# Patient Record
Sex: Male | Born: 1965 | Race: White | Hispanic: No | Marital: Single | Smoking: Former smoker
Health system: Southern US, Community
[De-identification: ages and names within clinical notes are randomized; demographics above are authoritative.]

## PROBLEM LIST (undated history)

## (undated) DIAGNOSIS — S42301A Unspecified fracture of shaft of humerus, right arm, initial encounter for closed fracture: Secondary | ICD-10-CM

## (undated) DIAGNOSIS — F419 Anxiety disorder, unspecified: Secondary | ICD-10-CM

## (undated) DIAGNOSIS — I639 Cerebral infarction, unspecified: Secondary | ICD-10-CM

## (undated) DIAGNOSIS — S42201A Unspecified fracture of upper end of right humerus, initial encounter for closed fracture: Secondary | ICD-10-CM

## (undated) DIAGNOSIS — S42309A Unspecified fracture of shaft of humerus, unspecified arm, initial encounter for closed fracture: Secondary | ICD-10-CM

## (undated) DIAGNOSIS — E785 Hyperlipidemia, unspecified: Secondary | ICD-10-CM

## (undated) DIAGNOSIS — R55 Syncope and collapse: Secondary | ICD-10-CM

## (undated) DIAGNOSIS — I10 Essential (primary) hypertension: Secondary | ICD-10-CM

## (undated) DIAGNOSIS — J300 Vasomotor rhinitis: Secondary | ICD-10-CM

## (undated) DIAGNOSIS — E119 Type 2 diabetes mellitus without complications: Secondary | ICD-10-CM

## (undated) DIAGNOSIS — G629 Polyneuropathy, unspecified: Secondary | ICD-10-CM

## (undated) DIAGNOSIS — I6521 Occlusion and stenosis of right carotid artery: Secondary | ICD-10-CM

## (undated) DIAGNOSIS — I480 Paroxysmal atrial fibrillation: Secondary | ICD-10-CM

## (undated) HISTORY — DX: Essential (primary) hypertension: I10

## (undated) HISTORY — PX: CATARACT EXTRACTION: SUR2

## (undated) HISTORY — PX: CHOLECYSTECTOMY: SHX55

## (undated) HISTORY — DX: Anxiety disorder, unspecified: F41.9

## (undated) HISTORY — DX: Polyneuropathy, unspecified: G62.9

## (undated) HISTORY — PX: OTHER SURGICAL HISTORY: SHX169

## (undated) HISTORY — PX: CYST REMOVAL NECK: SHX6281

## (undated) HISTORY — PX: WISDOM TOOTH EXTRACTION: SHX21

---

## 2016-08-20 ENCOUNTER — Emergency Department: Payer: Self-pay

## 2016-08-20 ENCOUNTER — Encounter: Payer: Self-pay | Admitting: Emergency Medicine

## 2016-08-20 ENCOUNTER — Emergency Department
Admission: EM | Admit: 2016-08-20 | Discharge: 2016-08-20 | Disposition: A | Discharge status: Home / Self Care | Payer: Self-pay | Attending: Emergency Medicine | Admitting: Emergency Medicine

## 2016-08-20 DIAGNOSIS — Z7984 Long term (current) use of oral hypoglycemic drugs: Secondary | ICD-10-CM | POA: Insufficient documentation

## 2016-08-20 DIAGNOSIS — Y939 Activity, unspecified: Secondary | ICD-10-CM | POA: Insufficient documentation

## 2016-08-20 DIAGNOSIS — I2510 Atherosclerotic heart disease of native coronary artery without angina pectoris: Secondary | ICD-10-CM | POA: Insufficient documentation

## 2016-08-20 DIAGNOSIS — Y929 Unspecified place or not applicable: Secondary | ICD-10-CM | POA: Insufficient documentation

## 2016-08-20 DIAGNOSIS — R739 Hyperglycemia, unspecified: Secondary | ICD-10-CM

## 2016-08-20 DIAGNOSIS — E1165 Type 2 diabetes mellitus with hyperglycemia: Secondary | ICD-10-CM | POA: Insufficient documentation

## 2016-08-20 DIAGNOSIS — S42354A Nondisplaced comminuted fracture of shaft of humerus, right arm, initial encounter for closed fracture: Secondary | ICD-10-CM | POA: Insufficient documentation

## 2016-08-20 DIAGNOSIS — Y990 Civilian activity done for income or pay: Secondary | ICD-10-CM | POA: Insufficient documentation

## 2016-08-20 DIAGNOSIS — W01190A Fall on same level from slipping, tripping and stumbling with subsequent striking against furniture, initial encounter: Secondary | ICD-10-CM | POA: Insufficient documentation

## 2016-08-20 LAB — CBC WITH DIFFERENTIAL/PLATELET
BASOS PCT: 1 %
Basophils Absolute: 0.1 10*3/uL (ref 0–0.1)
EOS ABS: 0.1 10*3/uL (ref 0–0.7)
EOS PCT: 1 %
HCT: 44.5 % (ref 40.0–52.0)
Hemoglobin: 14.9 g/dL (ref 13.0–18.0)
Lymphocytes Relative: 14 %
Lymphs Abs: 1.6 10*3/uL (ref 1.0–3.6)
MCH: 27.9 pg (ref 26.0–34.0)
MCHC: 33.5 g/dL (ref 32.0–36.0)
MCV: 83.5 fL (ref 80.0–100.0)
MONO ABS: 0.8 10*3/uL (ref 0.2–1.0)
MONOS PCT: 7 %
Neutro Abs: 8.6 10*3/uL — ABNORMAL HIGH (ref 1.4–6.5)
Neutrophils Relative %: 77 %
PLATELETS: 205 10*3/uL (ref 150–440)
RBC: 5.33 MIL/uL (ref 4.40–5.90)
RDW: 15 % — AB (ref 11.5–14.5)
WBC: 11.3 10*3/uL — ABNORMAL HIGH (ref 3.8–10.6)

## 2016-08-20 LAB — COMPREHENSIVE METABOLIC PANEL
ALBUMIN: 3.9 g/dL (ref 3.5–5.0)
ALT: 27 U/L (ref 17–63)
ANION GAP: 13 (ref 5–15)
AST: 30 U/L (ref 15–41)
Alkaline Phosphatase: 69 U/L (ref 38–126)
BILIRUBIN TOTAL: 0.7 mg/dL (ref 0.3–1.2)
BUN: 21 mg/dL — ABNORMAL HIGH (ref 6–20)
CO2: 19 mmol/L — AB (ref 22–32)
Calcium: 9.3 mg/dL (ref 8.9–10.3)
Chloride: 100 mmol/L — ABNORMAL LOW (ref 101–111)
Creatinine, Ser: 0.72 mg/dL (ref 0.61–1.24)
GFR calc Af Amer: >60 mL/min (ref >60)
GFR calc non Af Amer: >60 mL/min (ref >60)
GLUCOSE: 410 mg/dL — AB (ref 65–99)
Potassium: 4 mmol/L (ref 3.5–5.1)
SODIUM: 132 mmol/L — AB (ref 135–145)
TOTAL PROTEIN: 7.6 g/dL (ref 6.5–8.1)

## 2016-08-20 LAB — GLUCOSE, CAPILLARY: Glucose-Capillary: 383 mg/dL — ABNORMAL HIGH (ref 65–99)

## 2016-08-20 MED ORDER — OXYCODONE-ACETAMINOPHEN 5-325 MG PO TABS
ORAL_TABLET | ORAL | Status: AC
  Administered 2016-08-20: 1 via ORAL
  Filled 2016-08-20: qty 1

## 2016-08-20 MED ORDER — OXYCODONE-ACETAMINOPHEN 5-325 MG PO TABS
1.0000 | ORAL_TABLET | Freq: Once | ORAL | Status: AC
  Administered 2016-08-20: 1 via ORAL

## 2016-08-20 MED ORDER — METFORMIN HCL 500 MG PO TABS: 500.0000 mg | ORAL_TABLET | Freq: Two times a day (BID) | ORAL | 0 refills | Status: DC

## 2016-08-20 MED ORDER — SODIUM CHLORIDE 0.9 % IV BOLUS (SEPSIS)
1000.0000 mL | Freq: Once | INTRAVENOUS | Status: AC
  Administered 2016-08-20: 1000 mL via INTRAVENOUS

## 2016-08-20 MED ORDER — OXYCODONE-ACETAMINOPHEN 5-325 MG PO TABS: 1.0000 | ORAL_TABLET | Freq: Four times a day (QID) | ORAL | 0 refills | Status: DC | PRN

## 2016-08-20 MED ORDER — MORPHINE SULFATE (PF) 4 MG/ML IV SOLN
4.0000 mg | Freq: Once | INTRAVENOUS | Status: AC
  Administered 2016-08-20: 4 mg via INTRAVENOUS

## 2016-08-20 MED ORDER — ONDANSETRON HCL 4 MG/2ML IJ SOLN
4.0000 mg | Freq: Once | INTRAMUSCULAR | Status: AC
  Administered 2016-08-20: 4 mg via INTRAVENOUS

## 2016-08-20 MED ORDER — ONDANSETRON HCL 4 MG/2ML IJ SOLN
INTRAMUSCULAR | Status: AC
  Administered 2016-08-20: 4 mg via INTRAVENOUS
  Filled 2016-08-20: qty 2

## 2016-08-20 MED ORDER — METFORMIN HCL 500 MG PO TABS
500.0000 mg | ORAL_TABLET | Freq: Once | ORAL | Status: AC
  Administered 2016-08-20: 500 mg via ORAL
  Filled 2016-08-20: qty 1

## 2016-08-20 MED ORDER — MORPHINE SULFATE (PF) 4 MG/ML IV SOLN
INTRAVENOUS | Status: AC
  Administered 2016-08-20: 4 mg via INTRAVENOUS
  Filled 2016-08-20: qty 1

## 2016-08-20 NOTE — ED Triage Notes (Signed)
Pt presents to ED via [facility] from his work s/p fall with obvious deformity to RUE. Given fentanyl PTA by EMS. Arm placed in sling upon arrival. CBG 492, diabetic, does not take metformin as prescribed.

## 2016-08-20 NOTE — ED Notes (Signed)
Patient c/o right arm pain after fall at work today. Pt has obvious deformity to upper right arm.

## 2016-08-20 NOTE — ED Provider Notes (Addendum)
[facility] Emergency Department Provider Note  ____________________________________________   None    (approximate)  I have reviewed the triage vital signs and the nursing notes.   HISTORY  Chief Complaint Arm Injury (RUE)   HPI [name] is a 51 y.o. male with a history of diabetes who is presenting to the emergency department after a fall. He is complaining of right arm pain. He says that he had a trip and fall and got his right arm caught in a table and then raise the right arm above his shoulder. He is having mid shaft humerus pain on that side. He was brought in by EMS who gave him 200 g of fentanyl en route. He says the pain is somewhat decreased at this time. He denies any head or losing consciousness. Denies any numbness or tingling. No deformity per EMS.   Past Medical History:  Diagnosis Date  . Coronary artery disease     There are no active problems to display for this patient.   History reviewed. No pertinent surgical history.  Prior to Admission medications   Not on File    Allergies Patient has no known allergies.  History reviewed. No pertinent family history.  Social History Social History  Substance Use Topics  . Smoking status: Never Smoker  . Smokeless tobacco: Never Used  . Alcohol use No    Review of Systems Constitutional: No fever/chills Eyes: No visual changes. ENT: No sore throat. Cardiovascular: Denies chest pain. Respiratory: Denies shortness of breath. Gastrointestinal: No abdominal pain.  No nausea, no vomiting.  No diarrhea.  No constipation. Genitourinary: Negative for dysuria. Musculoskeletal: Negative for back pain. Skin: Negative for rash. Neurological: Negative for headaches, focal weakness or numbness.  10-point ROS otherwise negative.  ____________________________________________   PHYSICAL EXAM:  VITAL SIGNS: ED Triage Vitals  Enc Vitals Group     BP      Pulse      Resp     Temp      Temp src      SpO2      Weight      Height      Head Circumference      Peak Flow      Pain Score      Pain Loc      Pain Edu?      Excl. in GC?     Constitutional: Alert and oriented. Well appearing and in no acute distress. Eyes: Conjunctivae are normal. PERRL. EOMI. Head: Atraumatic. Nose: No congestion/rhinnorhea. Mouth/Throat: Mucous membranes are moist.  Neck: No stridor.   Cardiovascular: Normal rate, regular rhythm. Grossly normal heart sounds.   Respiratory: Normal respiratory effort.  No retractions. Lungs CTAB. Gastrointestinal: Soft and nontender. No distention. Musculoskeletal: No lower extremity tenderness nor edema.  Right humerus with tenderness to the midshaft. Possible mild deformity with lateral bump without tenting. Distal to the site of the injury the patient is able to range his elbow. No tenderness to the elbow. He only has minimal range of motion at the elbow on the right however because of pain to the mid shaft humerus. He is neurovascularly intact with an intact radial pulse as well as sensation is intact to light touch. He is also able to move all of his fingers at 5 out of 5 strength.  Neurologic:  Normal speech and language. No gross focal neurologic deficits are appreciated. No gait instability. Skin:  Skin is warm, dry and intact. No rash noted. Psychiatric:  Mood and affect are normal. Speech and behavior are normal.  ____________________________________________   LABS (all labs ordered are listed, but only abnormal results are displayed)  Labs Reviewed  CBC WITH DIFFERENTIAL/PLATELET - Abnormal; Notable for the following:       Result Value   WBC 11.3 (*)    RDW 15.0 (*)    Neutro Abs 8.6 (*)    All other components within normal limits  COMPREHENSIVE METABOLIC PANEL - Abnormal; Notable for the following:    Sodium 132 (*)    Chloride 100 (*)    CO2 19 (*)    Glucose, Bld 410 (*)    BUN 21 (*)    All other components within  normal limits  GLUCOSE, CAPILLARY - Abnormal; Notable for the following:    Glucose-Capillary 383 (*)    All other components within normal limits   ____________________________________________  EKG   ____________________________________________  RADIOLOGY  DG Humerus Right (Final result)  Result time 08/20/16 21:41:28  Final result by Kennith CenterEric Mansell, MD (08/20/16 21:41:28)           Narrative:   CLINICAL DATA: Right arm pain after a fall at work today.  EXAM: RIGHT HUMERUS - 2+ VIEW  COMPARISON: None.  FINDINGS: Comminuted fracture of the proximal humeral diaphysis is identified with multiple large fracture fragments evident. No substantial angulation at the fracture site although approximately 1 cortex width of fracture fragment distraction is evident.  IMPRESSION: Comminuted fracture proximal humerus.   Electronically Signed By: Kennith CenterEric Mansell M.D. On: 08/20/2016 21:41            ____________________________________________   PROCEDURES  Procedure(s) performed:   Procedures  Critical Care performed:   ____________________________________________   INITIAL IMPRESSION / ASSESSMENT AND PLAN / ED COURSE  Pertinent labs & imaging results that were available during my care of the patient were reviewed by me and considered in my medical decision making (see chart for details).   Clinical Course as of Aug 20 2299  Sat Aug 20, 2016  2210 Discussed case with Dr. [facility] orth[facility] who says that the patient may return follow-up as an outpatient. He may stay in the sling but recommends a "U-splint" to the arm.  [DS]    Clinical Course User Index [DS] Myrna Blazeravid Matthew Dotti Busey, MD  Patient will be placed in a sling for comfort. The patient will also have labs drawn. His glucose was 492 in route per EMS. The patient says that he is supposed to be on 500 mg, twice a day of metformin but has not been taking  it.  ----------------------------------------- 11:01 PM on 08/20/2016 -----------------------------------------  Patient without any distress but still in pain. He'll be discharged with Percocet. He is neurovascularly intact after splinting. He knows that he must follow-up with orthopedics. To remain in the sling as well. He is understanding the plan and willing to comply. Says the splint does not feel too tight.  Pt also off of metformin.  Glucose down after iv fluids.   ____________________________________________   FINAL CLINICAL IMPRESSION(S) / ED DIAGNOSES  Comminuted fracture of the humerus.  Hyperglycemia.     NEW MEDICATIONS STARTED DURING THIS VISIT:  New Prescriptions   No medications on file     Note:  This document was prepared using Dragon voice recognition software and may include unintentional dictation errors.    Myrna Blazeravid Matthew Winslow Verrill, MD 08/20/16 [id number]    Myrna Blazeravid Matthew Eldana Isip, MD 08/20/16 [phone]

## 2016-08-20 NOTE — ED Notes (Signed)
Reviewed d/c instructions, follow-up care, prescriptions, splint care, use of ice/elvation with patient. Pt verbalized understanding

## 2016-09-01 ENCOUNTER — Encounter (HOSPITAL_COMMUNITY): Payer: Self-pay | Admitting: *Deleted

## 2016-09-01 MED ORDER — DEXTROSE 5 % IV SOLN
3.0000 g | INTRAVENOUS | Status: AC
  Administered 2016-09-02: 3 g via INTRAVENOUS
  Filled 2016-09-01: qty 3000

## 2016-09-01 MED ORDER — LACTATED RINGERS IV SOLN
INTRAVENOUS | Status: DC
  Administered 2016-09-02 (×3): via INTRAVENOUS

## 2016-09-01 NOTE — Progress Notes (Signed)
Pt denies SOB, chest pain, and being under the care of a cardiologist. Pt denies having CAD; pt stated that his father had a history of CAD. Pt denies having a cardiac cath but stated that a stress test and " probably" an echo was [place] > 10 [place] ago " [place] " Pt denies having a chest x ray and EKG within the last year. Pt stated that labs were done on 08/20/16 ( in Epic) but denies having an A1c within the last 2 months. Pt made aware to stop taking Aspirin, vitamins, fish oil and herbal medications. Do not take any NSAIDs ie: Ibuprofen, Advil, Naproxen, BC and Goody Powder or any medication containing Aspirin. Pt stated that his fasting blood glucose ranges in the "mid 100's." Pt made aware of diabetes protocol to check blood glucose (BG) every 2 hours prior to arrival to hospital, interventions for BG <70 ( 4 ounces of Cranberry or Apple Juice and check BG 15 minutes after drinking juice; if BG is still <70 call the SS unit to speak with a nurse ( phone number provided). Pt made aware to not take Metformin on morning of procedure. Pt verbalized understanding of all pre-op instructions.

## 2016-09-01 NOTE — Anesthesia Preprocedure Evaluation (Addendum)
Anesthesia Evaluation  Patient identified by MRN, date of birth, ID band Patient awake    Reviewed: Allergy & Precautions, NPO status , Patient's Chart, lab work & pertinent test results  History of Anesthesia Complications Negative for: history of anesthetic complications  Airway Mallampati: II  TM Distance: >3 FB Neck ROM: Full    Dental  (+) Dental Advisory Given, Chipped   Pulmonary neg pulmonary ROS,    breath sounds clear to auscultation       Cardiovascular (-) anginanegative cardio ROS   Rhythm:Regular Rate:Normal     Neuro/Psych negative neurological ROS     GI/Hepatic negative GI ROS, Neg liver ROS,   Endo/Other  diabetes (glu 299), Oral Hypoglycemic AgentsMorbid obesity  Renal/GU negative Renal ROS     Musculoskeletal   Abdominal (+) + obese,   Peds  Hematology negative hematology ROS (+)   Anesthesia Other Findings   Reproductive/Obstetrics                            Anesthesia Physical Anesthesia Plan  ASA: III  Anesthesia Plan: General   Post-op Pain Management: GA combined w/ Regional for post-op pain   Induction: Intravenous  Airway Management Planned: Oral ETT  Additional Equipment:   Intra-op Plan:   Post-operative Plan: Extubation in OR  Informed Consent: I have reviewed the patients History and Physical, chart, labs and discussed the procedure including the risks, benefits and alternatives for the proposed anesthesia with the patient or authorized representative who has indicated his/her understanding and acceptance.   Dental advisory given  Plan Discussed with: CRNA and Surgeon  Anesthesia Plan Comments: (Plan routine monitors, GETA with interscalene block for post op analgesia)        Anesthesia Quick Evaluation

## 2016-09-01 NOTE — H&P (Signed)
[facility] H&P/Consult     Chief Complaint: R humerus fracture HPI:   [name] is an 51 y.o. RHD male who sustained a fall while at home on 08/20/2016. The fall resulted in a R humerus fracture with extension to proximal humerus. Pt was referred to OTS for definitive treatment. We discussed non-op vs op management. Pt has opted for surgical management. 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 He moved here about 1 year ago from [place].   He admits to being a poorly controlled diabetic. Takes metformin but doesn't check sugars regularly.   He does not smoke, social EtOH   Pt presents today for ORIF R humerus  Denies numbness or tingling   No other injuries noted    Past Medical History:  Diagnosis Date  . Diabetes mellitus without complication (HCC)   . Fracture closed, humerus    right    Past Surgical History:  Procedure Laterality Date  . CHOLECYSTECTOMY    . WISDOM TOOTH EXTRACTION      Family History  Problem Relation Age of Onset  . Sjogren's syndrome Mother   . CAD Father   . Diabetes Father    Social History:  reports that he has never smoked. He has never used smokeless tobacco. He reports that he drinks alcohol. He reports that he does not use drugs.  Allergies:  Allergies  Allergen Reactions  . No Known Allergies     No current facility-administered medications on file prior to encounter.    Current Outpatient Prescriptions on File Prior to Encounter  Medication Sig Dispense Refill  . metFORMIN (GLUCOPHAGE) 500 MG tablet Take 1 tablet (500 mg total) by mouth 2 (two) times daily with a meal. 60 tablet 0  . oxyCODONE-acetaminophen (ROXICET) 5-325 MG tablet Take 1-2 tablets by mouth every 6 (six) hours as needed. (Patient not taking: Reported on 08/31/2016) 15 tablet 0    Labs    pending  Review of Systems  Constitutional: Negative for chills and fever.  Eyes: Negative for blurred vision.  Respiratory: Negative for  cough, shortness of breath and wheezing.   Cardiovascular: Negative for chest pain and palpitations.  Gastrointestinal: Negative for abdominal pain, nausea and vomiting.  Genitourinary: Negative for dysuria and urgency.  Neurological: Negative for tingling and sensory change.    Vitals on arrival to short stay  Physical Exam  Constitutional: He is cooperative. No distress.  Pleasant white male Obese   Cardiovascular: Normal rate, regular rhythm, S1 normal and S2 normal.   Pulmonary/Chest: No respiratory distress.  CTA B   Musculoskeletal:  Right Upper Extremity    Extensive swelling R upper extremity     No open wounds     Soft tissue wrinkles with gentle compression to upper extremity     Radial, ulnar, median nv motor and sensory functions intact     + radial pulse     Ext warm      Elbow, forearm, wrist and hand are nontender       Neurological: He is alert.  Psychiatric: He has a normal mood and affect. Cognition and memory are normal.      Assessment/Plan  51 y/o RHD male with R humerus fracture   OR for ORIF  Outpatient procedure  Pt understands risks and benefits of surgery and would like to proceed No active abduction x 8 weeks  Unrestricted elbow, forearm, wrist ROM. Ok for shoulder flexion and extension   Mearl Latin,  PA-C [facility] [phone] (P) 09/01/2016, 10:27 PM

## 2016-09-02 ENCOUNTER — Ambulatory Visit (HOSPITAL_COMMUNITY): Payer: Self-pay

## 2016-09-02 ENCOUNTER — Encounter (HOSPITAL_COMMUNITY)
Admission: RE | Disposition: A | Discharge status: Home / Self Care | Payer: Self-pay | Source: Ambulatory Visit | Attending: Orthopedic Surgery

## 2016-09-02 ENCOUNTER — Ambulatory Visit (HOSPITAL_COMMUNITY): Payer: Self-pay | Admitting: Anesthesiology

## 2016-09-02 ENCOUNTER — Observation Stay (HOSPITAL_COMMUNITY)
Admission: RE | Admit: 2016-09-02 | Discharge: 2016-09-03 | Disposition: A | Discharge status: Home / Self Care | Payer: Self-pay | Source: Ambulatory Visit | Attending: Orthopedic Surgery | Admitting: Orthopedic Surgery

## 2016-09-02 ENCOUNTER — Encounter (HOSPITAL_COMMUNITY): Payer: Self-pay | Admitting: *Deleted

## 2016-09-02 DIAGNOSIS — Z9889 Other specified postprocedural states: Secondary | ICD-10-CM | POA: Insufficient documentation

## 2016-09-02 DIAGNOSIS — Z7984 Long term (current) use of oral hypoglycemic drugs: Secondary | ICD-10-CM | POA: Insufficient documentation

## 2016-09-02 DIAGNOSIS — Z833 Family history of diabetes mellitus: Secondary | ICD-10-CM | POA: Insufficient documentation

## 2016-09-02 DIAGNOSIS — T148XXA Other injury of unspecified body region, initial encounter: Secondary | ICD-10-CM

## 2016-09-02 DIAGNOSIS — S42301A Unspecified fracture of shaft of humerus, right arm, initial encounter for closed fracture: Secondary | ICD-10-CM | POA: Diagnosis present

## 2016-09-02 DIAGNOSIS — S42351A Displaced comminuted fracture of shaft of humerus, right arm, initial encounter for closed fracture: Secondary | ICD-10-CM | POA: Insufficient documentation

## 2016-09-02 DIAGNOSIS — Z419 Encounter for procedure for purposes other than remedying health state, unspecified: Secondary | ICD-10-CM

## 2016-09-02 DIAGNOSIS — W19XXXA Unspecified fall, initial encounter: Secondary | ICD-10-CM | POA: Insufficient documentation

## 2016-09-02 DIAGNOSIS — E119 Type 2 diabetes mellitus without complications: Secondary | ICD-10-CM

## 2016-09-02 DIAGNOSIS — S42201A Unspecified fracture of upper end of right humerus, initial encounter for closed fracture: Principal | ICD-10-CM | POA: Diagnosis present

## 2016-09-02 DIAGNOSIS — Z01818 Encounter for other preprocedural examination: Secondary | ICD-10-CM

## 2016-09-02 DIAGNOSIS — Z832 Family history of diseases of the blood and blood-forming organs and certain disorders involving the immune mechanism: Secondary | ICD-10-CM | POA: Insufficient documentation

## 2016-09-02 DIAGNOSIS — Z8249 Family history of ischemic heart disease and other diseases of the circulatory system: Secondary | ICD-10-CM | POA: Insufficient documentation

## 2016-09-02 DIAGNOSIS — Z9049 Acquired absence of other specified parts of digestive tract: Secondary | ICD-10-CM | POA: Insufficient documentation

## 2016-09-02 DIAGNOSIS — S42411A Displaced simple supracondylar fracture without intercondylar fracture of right humerus, initial encounter for closed fracture: Secondary | ICD-10-CM

## 2016-09-02 DIAGNOSIS — I10 Essential (primary) hypertension: Secondary | ICD-10-CM

## 2016-09-02 HISTORY — DX: Unspecified fracture of shaft of humerus, right arm, initial encounter for closed fracture: S42.301A

## 2016-09-02 HISTORY — PX: ORIF HUMERUS FRACTURE: SHX2126

## 2016-09-02 HISTORY — DX: Unspecified fracture of shaft of humerus, unspecified arm, initial encounter for closed fracture: S42.309A

## 2016-09-02 HISTORY — DX: Unspecified fracture of upper end of right humerus, initial encounter for closed fracture: S42.201A

## 2016-09-02 HISTORY — DX: Type 2 diabetes mellitus without complications: E11.9

## 2016-09-02 LAB — ABO/RH: ABO/RH(D): O POS

## 2016-09-02 LAB — CBC WITH DIFFERENTIAL/PLATELET
BASOS PCT: 0 %
Basophils Absolute: 0 10*3/uL (ref 0.0–0.1)
EOS ABS: 0.2 10*3/uL (ref 0.0–0.7)
Eosinophils Relative: 2 %
HCT: 42.7 % (ref 39.0–52.0)
HEMOGLOBIN: 13.9 g/dL (ref 13.0–17.0)
Lymphocytes Relative: 18 %
Lymphs Abs: 2 10*3/uL (ref 0.7–4.0)
MCH: 28 pg (ref 26.0–34.0)
MCHC: 32.6 g/dL (ref 30.0–36.0)
MCV: 85.9 fL (ref 78.0–100.0)
MONOS PCT: 4 %
Monocytes Absolute: 0.5 10*3/uL (ref 0.1–1.0)
NEUTROS ABS: 8.4 10*3/uL — AB (ref 1.7–7.7)
NEUTROS PCT: 76 %
Platelets: 240 10*3/uL (ref 150–400)
RBC: 4.97 MIL/uL (ref 4.22–5.81)
RDW: 14.6 % (ref 11.5–15.5)
WBC: 11.1 10*3/uL — AB (ref 4.0–10.5)

## 2016-09-02 LAB — URINALYSIS, ROUTINE W REFLEX MICROSCOPIC
BACTERIA UA: NONE SEEN
Bilirubin Urine: NEGATIVE
Glucose, UA: >=500 mg/dL — AB
Hgb urine dipstick: NEGATIVE
KETONES UR: 5 mg/dL — AB
LEUKOCYTES UA: NEGATIVE
Nitrite: NEGATIVE
PH: 5 (ref 5.0–8.0)
PROTEIN: NEGATIVE mg/dL
Specific Gravity, Urine: 1.025 (ref 1.005–1.030)

## 2016-09-02 LAB — COMPREHENSIVE METABOLIC PANEL
ALBUMIN: 3.4 g/dL — AB (ref 3.5–5.0)
ALK PHOS: 85 U/L (ref 38–126)
ALT: 29 U/L (ref 17–63)
ANION GAP: 12 (ref 5–15)
AST: 26 U/L (ref 15–41)
BILIRUBIN TOTAL: 0.7 mg/dL (ref 0.3–1.2)
BUN: 11 mg/dL (ref 6–20)
CALCIUM: 9.2 mg/dL (ref 8.9–10.3)
CO2: 22 mmol/L (ref 22–32)
CREATININE: 0.57 mg/dL — AB (ref 0.61–1.24)
Chloride: 102 mmol/L (ref 101–111)
GFR calc Af Amer: >60 mL/min (ref >60)
GFR calc non Af Amer: >60 mL/min (ref >60)
GLUCOSE: 299 mg/dL — AB (ref 65–99)
Potassium: 4 mmol/L (ref 3.5–5.1)
SODIUM: 136 mmol/L (ref 135–145)
TOTAL PROTEIN: 6.7 g/dL (ref 6.5–8.1)

## 2016-09-02 LAB — GLUCOSE, CAPILLARY
GLUCOSE-CAPILLARY: 318 mg/dL — AB (ref 65–99)
Glucose-Capillary: 291 mg/dL — ABNORMAL HIGH (ref 65–99)
Glucose-Capillary: 312 mg/dL — ABNORMAL HIGH (ref 65–99)
Glucose-Capillary: 337 mg/dL — ABNORMAL HIGH (ref 65–99)
Glucose-Capillary: 281 mg/dL — ABNORMAL HIGH (ref 65–99)

## 2016-09-02 LAB — TYPE AND SCREEN
ABO/RH(D): O POS
ANTIBODY SCREEN: NEGATIVE

## 2016-09-02 LAB — PROTIME-INR
INR: 0.95
Prothrombin Time: 12.6 seconds (ref 11.4–15.2)

## 2016-09-02 LAB — APTT: APTT: 31 s (ref 24–36)

## 2016-09-02 SURGERY — OPEN REDUCTION INTERNAL FIXATION (ORIF) HUMERAL SHAFT FRACTURE
Anesthesia: Regional | Laterality: Right

## 2016-09-02 MED ORDER — NEOSTIGMINE METHYLSULFATE 10 MG/10ML IV SOLN
INTRAVENOUS | Status: DC | PRN
  Administered 2016-09-02: 4 mg via INTRAVENOUS

## 2016-09-02 MED ORDER — INSULIN ASPART 100 UNIT/ML ~~LOC~~ SOLN
0.0000 [IU] | Freq: Three times a day (TID) | SUBCUTANEOUS | Status: DC
  Administered 2016-09-02: 11 [IU] via SUBCUTANEOUS

## 2016-09-02 MED ORDER — PROPOFOL 10 MG/ML IV BOLUS
INTRAVENOUS | Status: AC
  Filled 2016-09-02: qty 40

## 2016-09-02 MED ORDER — OXYCODONE HCL 5 MG PO TABS
5.0000 mg | ORAL_TABLET | Freq: Once | ORAL | Status: AC
  Administered 2016-09-02: 10 mg via ORAL

## 2016-09-02 MED ORDER — PHENYLEPHRINE HCL 10 MG/ML IJ SOLN
INTRAMUSCULAR | Status: DC | PRN
  Administered 2016-09-02: 40 ug via INTRAVENOUS
  Administered 2016-09-02: 80 ug via INTRAVENOUS
  Administered 2016-09-02: 120 ug via INTRAVENOUS

## 2016-09-02 MED ORDER — ALBUMIN HUMAN 5 % IV SOLN
INTRAVENOUS | Status: DC | PRN
  Administered 2016-09-02: 11:00:00 via INTRAVENOUS

## 2016-09-02 MED ORDER — INSULIN ASPART 100 UNIT/ML ~~LOC~~ SOLN
4.0000 [IU] | Freq: Three times a day (TID) | SUBCUTANEOUS | Status: DC
  Administered 2016-09-02: 4 [IU] via SUBCUTANEOUS

## 2016-09-02 MED ORDER — PROMETHAZINE HCL 25 MG/ML IJ SOLN
6.2500 mg | INTRAMUSCULAR | Status: DC | PRN
  Administered 2016-09-02: 6.25 mg via INTRAVENOUS

## 2016-09-02 MED ORDER — OXYMETAZOLINE HCL 0.05 % NA SOLN
NASAL | Status: AC
  Filled 2016-09-02: qty 15

## 2016-09-02 MED ORDER — METHOCARBAMOL 1000 MG/10ML IJ SOLN
500.0000 mg | Freq: Four times a day (QID) | INTRAVENOUS | Status: DC | PRN
  Filled 2016-09-02: qty 5

## 2016-09-02 MED ORDER — LIVING WELL WITH DIABETES BOOK
Freq: Once | Status: AC
  Administered 2016-09-02: 1
  Filled 2016-09-02: qty 1

## 2016-09-02 MED ORDER — ENOXAPARIN SODIUM 40 MG/0.4ML ~~LOC~~ SOLN
40.0000 mg | SUBCUTANEOUS | Status: DC
  Administered 2016-09-03: 40 mg via SUBCUTANEOUS
  Filled 2016-09-02: qty 0.4

## 2016-09-02 MED ORDER — OXYCODONE HCL 5 MG PO TABS
ORAL_TABLET | ORAL | Status: AC
  Filled 2016-09-02: qty 2

## 2016-09-02 MED ORDER — 0.9 % SODIUM CHLORIDE (POUR BTL) OPTIME
TOPICAL | Status: DC | PRN
  Administered 2016-09-02: 1000 mL

## 2016-09-02 MED ORDER — SUCCINYLCHOLINE CHLORIDE 20 MG/ML IJ SOLN
INTRAMUSCULAR | Status: DC | PRN
  Administered 2016-09-02: 100 mg via INTRAVENOUS

## 2016-09-02 MED ORDER — METOCLOPRAMIDE HCL 5 MG/ML IJ SOLN: 5.0000 mg | Freq: Three times a day (TID) | INTRAMUSCULAR | Status: DC | PRN

## 2016-09-02 MED ORDER — DEXAMETHASONE SODIUM PHOSPHATE 10 MG/ML IJ SOLN
INTRAMUSCULAR | Status: AC
  Filled 2016-09-02: qty 1

## 2016-09-02 MED ORDER — POLYETHYLENE GLYCOL 3350 17 G PO PACK
17.0000 g | PACK | Freq: Every day | ORAL | Status: DC
  Administered 2016-09-02 – 2016-09-03 (×2): 17 g via ORAL
  Filled 2016-09-02 (×2): qty 1

## 2016-09-02 MED ORDER — DEXAMETHASONE SODIUM PHOSPHATE 10 MG/ML IJ SOLN
INTRAMUSCULAR | Status: DC | PRN
  Administered 2016-09-02: 10 mg via INTRAVENOUS

## 2016-09-02 MED ORDER — GLYCOPYRROLATE 0.2 MG/ML IJ SOLN
INTRAMUSCULAR | Status: DC | PRN
  Administered 2016-09-02: 0.6 mg via INTRAVENOUS

## 2016-09-02 MED ORDER — OXYCODONE HCL 5 MG PO TABS: 5.0000 mg | ORAL_TABLET | Freq: Four times a day (QID) | ORAL | Status: DC | PRN

## 2016-09-02 MED ORDER — OXYCODONE-ACETAMINOPHEN 5-325 MG PO TABS: 1.0000 | ORAL_TABLET | Freq: Four times a day (QID) | ORAL | Status: DC | PRN

## 2016-09-02 MED ORDER — PROMETHAZINE HCL 25 MG/ML IJ SOLN
INTRAMUSCULAR | Status: AC
  Filled 2016-09-02: qty 1

## 2016-09-02 MED ORDER — INSULIN ASPART 100 UNIT/ML ~~LOC~~ SOLN: 4.0000 [IU] | Freq: Three times a day (TID) | SUBCUTANEOUS | Status: DC

## 2016-09-02 MED ORDER — METFORMIN HCL 500 MG PO TABS
500.0000 mg | ORAL_TABLET | Freq: Two times a day (BID) | ORAL | Status: DC
  Administered 2016-09-02 – 2016-09-03 (×3): 500 mg via ORAL
  Filled 2016-09-02 (×3): qty 1

## 2016-09-02 MED ORDER — INSULIN GLARGINE 100 UNIT/ML ~~LOC~~ SOLN
20.0000 [IU] | Freq: Every day | SUBCUTANEOUS | Status: DC
  Administered 2016-09-02: 20 [IU] via SUBCUTANEOUS
  Filled 2016-09-02: qty 0.2

## 2016-09-02 MED ORDER — OXYCODONE HCL 5 MG PO TABS
5.0000 mg | ORAL_TABLET | ORAL | Status: DC | PRN
  Administered 2016-09-02 – 2016-09-03 (×7): 10 mg via ORAL
  Filled 2016-09-02 (×7): qty 2

## 2016-09-02 MED ORDER — OXYCODONE HCL 5 MG PO TABS: 5.0000 mg | ORAL_TABLET | Freq: Four times a day (QID) | ORAL | 0 refills | Status: DC | PRN

## 2016-09-02 MED ORDER — ACETAMINOPHEN 650 MG RE SUPP: 650.0000 mg | Freq: Four times a day (QID) | RECTAL | Status: DC | PRN

## 2016-09-02 MED ORDER — INSULIN GLARGINE 100 UNIT/ML ~~LOC~~ SOLN
10.0000 [IU] | Freq: Every day | SUBCUTANEOUS | Status: DC
  Filled 2016-09-02: qty 0.1

## 2016-09-02 MED ORDER — ONDANSETRON HCL 4 MG/2ML IJ SOLN: 4.0000 mg | Freq: Four times a day (QID) | INTRAMUSCULAR | Status: DC | PRN

## 2016-09-02 MED ORDER — ONDANSETRON HCL 4 MG PO TABS: 4.0000 mg | ORAL_TABLET | Freq: Four times a day (QID) | ORAL | Status: DC | PRN

## 2016-09-02 MED ORDER — PHENYLEPHRINE HCL 10 MG/ML IJ SOLN
INTRAMUSCULAR | Status: AC
  Filled 2016-09-02: qty 1

## 2016-09-02 MED ORDER — OXYCODONE-ACETAMINOPHEN 10-325 MG PO TABS: 1.0000 | ORAL_TABLET | Freq: Four times a day (QID) | ORAL | 0 refills | Status: DC | PRN

## 2016-09-02 MED ORDER — HYDROMORPHONE HCL 2 MG/ML IJ SOLN: 1.0000 mg | INTRAMUSCULAR | Status: DC | PRN

## 2016-09-02 MED ORDER — LIDOCAINE 2% (20 MG/ML) 5 ML SYRINGE
INTRAMUSCULAR | Status: AC
  Filled 2016-09-02: qty 5

## 2016-09-02 MED ORDER — METHOCARBAMOL 500 MG PO TABS
1000.0000 mg | ORAL_TABLET | Freq: Four times a day (QID) | ORAL | Status: DC | PRN
  Administered 2016-09-03 (×3): 1000 mg via ORAL
  Filled 2016-09-02 (×3): qty 2

## 2016-09-02 MED ORDER — PROPOFOL 10 MG/ML IV BOLUS
INTRAVENOUS | Status: DC | PRN
  Administered 2016-09-02: 100 mg via INTRAVENOUS
  Administered 2016-09-02: 200 mg via INTRAVENOUS

## 2016-09-02 MED ORDER — INSULIN ASPART 100 UNIT/ML ~~LOC~~ SOLN
0.0000 [IU] | Freq: Three times a day (TID) | SUBCUTANEOUS | Status: DC
  Administered 2016-09-02 – 2016-09-03 (×2): 11 [IU] via SUBCUTANEOUS
  Administered 2016-09-03: 5 [IU] via SUBCUTANEOUS
  Administered 2016-09-03: 8 [IU] via SUBCUTANEOUS

## 2016-09-02 MED ORDER — CEFAZOLIN IN D5W 1 GM/50ML IV SOLN
1.0000 g | Freq: Four times a day (QID) | INTRAVENOUS | Status: AC
  Administered 2016-09-02 – 2016-09-03 (×3): 1 g via INTRAVENOUS
  Filled 2016-09-02 (×3): qty 50

## 2016-09-02 MED ORDER — PHENYLEPHRINE HCL 10 MG/ML IJ SOLN
INTRAMUSCULAR | Status: DC | PRN
  Administered 2016-09-02: 25 ug/min via INTRAVENOUS

## 2016-09-02 MED ORDER — NEOSTIGMINE METHYLSULFATE 5 MG/5ML IV SOSY
PREFILLED_SYRINGE | INTRAVENOUS | Status: AC
  Filled 2016-09-02: qty 5

## 2016-09-02 MED ORDER — ACETAMINOPHEN 325 MG PO TABS: 650.0000 mg | ORAL_TABLET | Freq: Four times a day (QID) | ORAL | Status: DC | PRN

## 2016-09-02 MED ORDER — POTASSIUM CHLORIDE IN NACL 20-0.9 MEQ/L-% IV SOLN
INTRAVENOUS | Status: DC
  Administered 2016-09-02 – 2016-09-03 (×2): via INTRAVENOUS
  Filled 2016-09-02 (×2): qty 1000

## 2016-09-02 MED ORDER — ROCURONIUM BROMIDE 100 MG/10ML IV SOLN
INTRAVENOUS | Status: DC | PRN
  Administered 2016-09-02 (×2): 50 mg via INTRAVENOUS
  Administered 2016-09-02: 10 mg via INTRAVENOUS

## 2016-09-02 MED ORDER — DOCUSATE SODIUM 100 MG PO CAPS
100.0000 mg | ORAL_CAPSULE | Freq: Two times a day (BID) | ORAL | Status: DC
  Administered 2016-09-02 – 2016-09-03 (×2): 100 mg via ORAL
  Filled 2016-09-02 (×2): qty 1

## 2016-09-02 MED ORDER — ROCURONIUM BROMIDE 50 MG/5ML IV SOSY
PREFILLED_SYRINGE | INTRAVENOUS | Status: AC
  Filled 2016-09-02: qty 10

## 2016-09-02 MED ORDER — ONDANSETRON HCL 4 MG/2ML IJ SOLN
INTRAMUSCULAR | Status: DC | PRN
  Administered 2016-09-02: 4 mg via INTRAVENOUS

## 2016-09-02 MED ORDER — FENTANYL CITRATE (PF) 100 MCG/2ML IJ SOLN
INTRAMUSCULAR | Status: AC
  Administered 2016-09-02: 100 ug via INTRAVENOUS
  Filled 2016-09-02: qty 2

## 2016-09-02 MED ORDER — SUCCINYLCHOLINE CHLORIDE 200 MG/10ML IV SOSY
PREFILLED_SYRINGE | INTRAVENOUS | Status: AC
  Filled 2016-09-02: qty 10

## 2016-09-02 MED ORDER — MIDAZOLAM HCL 2 MG/2ML IJ SOLN
INTRAMUSCULAR | Status: AC
  Filled 2016-09-02: qty 2

## 2016-09-02 MED ORDER — CHLORHEXIDINE GLUCONATE 4 % EX LIQD: 60.0000 mL | Freq: Once | CUTANEOUS | Status: DC

## 2016-09-02 MED ORDER — ONDANSETRON HCL 4 MG/2ML IJ SOLN
INTRAMUSCULAR | Status: AC
  Filled 2016-09-02: qty 2

## 2016-09-02 MED ORDER — HYDROMORPHONE HCL 1 MG/ML IJ SOLN: 0.2500 mg | INTRAMUSCULAR | Status: DC | PRN

## 2016-09-02 MED ORDER — MIDAZOLAM HCL 2 MG/2ML IJ SOLN: 0.5000 mg | Freq: Once | INTRAMUSCULAR | Status: DC | PRN

## 2016-09-02 MED ORDER — INSULIN ASPART 100 UNIT/ML ~~LOC~~ SOLN
SUBCUTANEOUS | Status: AC
  Filled 2016-09-02: qty 1

## 2016-09-02 MED ORDER — PHENYLEPHRINE 40 MCG/ML (10ML) SYRINGE FOR IV PUSH (FOR BLOOD PRESSURE SUPPORT)
PREFILLED_SYRINGE | INTRAVENOUS | Status: AC
  Filled 2016-09-02: qty 280

## 2016-09-02 MED ORDER — MIDAZOLAM HCL 5 MG/5ML IJ SOLN
INTRAMUSCULAR | Status: DC | PRN
  Administered 2016-09-02: 2 mg via INTRAVENOUS

## 2016-09-02 MED ORDER — PROMETHAZINE HCL 12.5 MG PO TABS: 12.5000 mg | ORAL_TABLET | Freq: Four times a day (QID) | ORAL | 0 refills | Status: DC | PRN

## 2016-09-02 MED ORDER — FENTANYL CITRATE (PF) 100 MCG/2ML IJ SOLN
100.0000 ug | Freq: Once | INTRAMUSCULAR | Status: AC
  Administered 2016-09-02: 100 ug via INTRAVENOUS

## 2016-09-02 MED ORDER — MEPERIDINE HCL 25 MG/ML IJ SOLN: 6.2500 mg | INTRAMUSCULAR | Status: DC | PRN

## 2016-09-02 MED ORDER — METOCLOPRAMIDE HCL 5 MG PO TABS: 5.0000 mg | ORAL_TABLET | Freq: Three times a day (TID) | ORAL | Status: DC | PRN

## 2016-09-02 MED ORDER — INSULIN ASPART 100 UNIT/ML ~~LOC~~ SOLN: 0.0000 [IU] | Freq: Three times a day (TID) | SUBCUTANEOUS | Status: DC

## 2016-09-02 MED ORDER — FENTANYL CITRATE (PF) 100 MCG/2ML IJ SOLN
INTRAMUSCULAR | Status: AC
  Filled 2016-09-02: qty 4

## 2016-09-02 MED ORDER — MIDAZOLAM HCL 2 MG/2ML IJ SOLN
INTRAMUSCULAR | Status: AC
  Administered 2016-09-02: 2 mg via INTRAVENOUS
  Filled 2016-09-02: qty 2

## 2016-09-02 MED ORDER — FENTANYL CITRATE (PF) 100 MCG/2ML IJ SOLN
INTRAMUSCULAR | Status: DC | PRN
  Administered 2016-09-02 (×3): 50 ug via INTRAVENOUS

## 2016-09-02 MED ORDER — PHENYLEPHRINE 40 MCG/ML (10ML) SYRINGE FOR IV PUSH (FOR BLOOD PRESSURE SUPPORT)
PREFILLED_SYRINGE | INTRAVENOUS | Status: AC
  Filled 2016-09-02: qty 10

## 2016-09-02 MED ORDER — MIDAZOLAM HCL 2 MG/2ML IJ SOLN
2.0000 mg | Freq: Once | INTRAMUSCULAR | Status: AC
  Administered 2016-09-02: 2 mg via INTRAVENOUS

## 2016-09-02 MED ORDER — INSULIN ASPART 100 UNIT/ML ~~LOC~~ SOLN
0.0000 [IU] | Freq: Every day | SUBCUTANEOUS | Status: DC
  Administered 2016-09-02: 3 [IU] via SUBCUTANEOUS

## 2016-09-02 SURGICAL SUPPLY — 74 items
BENZOIN TINCTURE PRP APPL 2/3 (GAUZE/BANDAGES/DRESSINGS) IMPLANT
BIT DRILL 3.2 (BIT) ×2
BIT DRILL 3.2XCALB NS DISP (BIT) ×1 IMPLANT
BIT DRILL CALIBRATED 2.7 (BIT) ×2 IMPLANT
BIT DRILL CALIBRATED 2.7MM (BIT) ×1
BIT DRL 3.2XCALB NS DISP (BIT) ×1
BNDG ESMARK 4X9 LF (GAUZE/BANDAGES/DRESSINGS) IMPLANT
BNDG GAUZE ELAST 4 BULKY (GAUZE/BANDAGES/DRESSINGS) IMPLANT
BRUSH SCRUB DISP (MISCELLANEOUS) ×6 IMPLANT
CORDS BIPOLAR (ELECTRODE) ×3 IMPLANT
COVER SURGICAL LIGHT HANDLE (MISCELLANEOUS) ×3 IMPLANT
DRAPE C-ARM 42X72 X-RAY (DRAPES) ×3 IMPLANT
DRAPE ORTHO SPLIT 77X108 STRL (DRAPES) ×4
DRAPE SURG ORHT 6 SPLT 77X108 (DRAPES) ×2 IMPLANT
DRAPE U-SHAPE 47X51 STRL (DRAPES) ×3 IMPLANT
DRSG ADAPTIC 3X8 NADH LF (GAUZE/BANDAGES/DRESSINGS) IMPLANT
DRSG MEPILEX BORDER 4X12 (GAUZE/BANDAGES/DRESSINGS) ×3 IMPLANT
DRSG PAD ABDOMINAL 8X10 ST (GAUZE/BANDAGES/DRESSINGS) IMPLANT
ELECT REM PT RETURN 9FT ADLT (ELECTROSURGICAL) ×3
ELECTRODE REM PT RTRN 9FT ADLT (ELECTROSURGICAL) ×1 IMPLANT
EVACUATOR 1/8 PVC DRAIN (DRAIN) IMPLANT
GAUZE SPONGE 4X4 12PLY STRL (GAUZE/BANDAGES/DRESSINGS) IMPLANT
GLOVE BIO SURGEON STRL SZ7.5 (GLOVE) ×3 IMPLANT
GLOVE BIO SURGEON STRL SZ8 (GLOVE) ×3 IMPLANT
GLOVE BIOGEL PI IND STRL 7.5 (GLOVE) ×1 IMPLANT
GLOVE BIOGEL PI IND STRL 8 (GLOVE) ×1 IMPLANT
GLOVE BIOGEL PI INDICATOR 7.5 (GLOVE) ×2
GLOVE BIOGEL PI INDICATOR 8 (GLOVE) ×2
GOWN STRL REUS W/ TWL LRG LVL3 (GOWN DISPOSABLE) ×2 IMPLANT
GOWN STRL REUS W/ TWL XL LVL3 (GOWN DISPOSABLE) ×1 IMPLANT
GOWN STRL REUS W/TWL LRG LVL3 (GOWN DISPOSABLE) ×4
GOWN STRL REUS W/TWL XL LVL3 (GOWN DISPOSABLE) ×2
K-WIRE 2X5 SS THRDED S3 (WIRE) ×6
KIT BASIN OR (CUSTOM PROCEDURE TRAY) ×3 IMPLANT
KIT ROOM TURNOVER OR (KITS) ×3 IMPLANT
KWIRE 2X5 SS THRDED S3 (WIRE) ×2 IMPLANT
MANIFOLD NEPTUNE II (INSTRUMENTS) ×3 IMPLANT
NEEDLE HYPO 25X1 1.5 SAFETY (NEEDLE) IMPLANT
NS IRRIG 1000ML POUR BTL (IV SOLUTION) ×3 IMPLANT
PACK ORTHO EXTREMITY (CUSTOM PROCEDURE TRAY) IMPLANT
PAD ARMBOARD 7.5X6 YLW CONV (MISCELLANEOUS) ×6 IMPLANT
PEG LOCKING 3.2MMX44 (Peg) ×3 IMPLANT
PEG LOCKING 3.2MMX46 (Peg) ×6 IMPLANT
PEG LOCKING 3.2MMX54 (Peg) ×3 IMPLANT
PEG LOCKING 3.2X38 (Screw) ×3 IMPLANT
PEG LOCKING 3.2X42 (Screw) ×3 IMPLANT
PEG LOCKING 3.2X50 (Screw) ×3 IMPLANT
PEG LOCKING 3.2X58MM (Peg) ×6 IMPLANT
PLATE PROX HUMERUS LOW RT 14H (Plate) ×3 IMPLANT
SCREW CORTICAL LOW PROF 3.5X32 (Screw) ×3 IMPLANT
SCREW CORTICAL LOW PROF 3.5X34 (Screw) ×3 IMPLANT
SCREW LOCK CORT STAR 3.5X28 (Screw) ×3 IMPLANT
SCREW LOW PROF TIS 3.5X28MM (Screw) ×3 IMPLANT
SCREW LOW PROFILE 3.5X30MM TIS (Screw) ×9 IMPLANT
SLEEVE MEASURING 3.2 (BIT) ×3 IMPLANT
SPONGE LAP 18X18 X RAY DECT (DISPOSABLE) ×3 IMPLANT
STAPLER VISISTAT 35W (STAPLE) ×3 IMPLANT
SUCTION FRAZIER HANDLE 10FR (MISCELLANEOUS) ×2
SUCTION TUBE FRAZIER 10FR DISP (MISCELLANEOUS) ×1 IMPLANT
SUT ETHILON 3 0 PS 1 (SUTURE) ×3 IMPLANT
SUT PDS AB 2-0 CT1 27 (SUTURE) IMPLANT
SUT VIC AB 0 CT1 27 (SUTURE) ×6
SUT VIC AB 0 CT1 27XBRD ANBCTR (SUTURE) ×3 IMPLANT
SUT VIC AB 2-0 CT1 27 (SUTURE) ×2
SUT VIC AB 2-0 CT1 TAPERPNT 27 (SUTURE) ×1 IMPLANT
SYR 5ML LL (SYRINGE) IMPLANT
SYR CONTROL 10ML LL (SYRINGE) ×3 IMPLANT
TOWEL OR 17X24 6PK STRL BLUE (TOWEL DISPOSABLE) ×3 IMPLANT
TOWEL OR 17X26 10 PK STRL BLUE (TOWEL DISPOSABLE) ×9 IMPLANT
TRAY FOLEY CATH 16FRSI W/METER (SET/KITS/TRAYS/PACK) IMPLANT
TUBE CONNECTING 12'X1/4 (SUCTIONS) ×1
TUBE CONNECTING 12X1/4 (SUCTIONS) ×2 IMPLANT
WATER STERILE IRR 1000ML POUR (IV SOLUTION) ×3 IMPLANT
YANKAUER SUCT BULB TIP NO VENT (SUCTIONS) IMPLANT

## 2016-09-02 NOTE — Transfer of Care (Signed)
Immediate Anesthesia Transfer of Care Note  Patient: [name]  Procedure(s) Performed: Procedure(s): OPEN REDUCTION INTERNAL FIXATION (ORIF) RIGHT  HUMERAL SHAFT FRACTURE (Right)  Patient Location: PACU  Anesthesia Type:General and Regional  Level of Consciousness: awake, oriented, patient cooperative and responds to stimulation  Airway & Oxygen Therapy: Patient Spontanous Breathing and Patient connected to face mask oxygen  Post-op Assessment: Report given to RN, Post -op Vital signs reviewed and stable and Patient moving all extremities X 4  Post vital signs: Reviewed and stable  Last Vitals:  Vitals:   09/02/16 0810 09/02/16 1142  BP:  (!) 148/73  Pulse: 100 94  Resp: (!) 23 (!) 26  Temp:      Last Pain:  Vitals:   09/02/16 0708  TempSrc:   PainSc: 4       Patients Stated Pain Goal: 2 (09/02/16 0708)  Complications: No apparent anesthesia complications

## 2016-09-02 NOTE — Brief Op Note (Signed)
09/02/2016  11:47 AM  PATIENT:  [name]  51 y.o. male  PRE-OPERATIVE DIAGNOSIS:   1. RIGHT PROXIMAL HUMERUS FRACTURE 2. RIGHT HUMERAL SHAFT FRACTURE  POST-OPERATIVE DIAGNOSIS:  1. RIGHT PROXIMAL HUMERUS FRACTURE 2. RIGHT HUMERAL SHAFT FRACTURE  PROCEDURE:  Procedure(s): OPEN REDUCTION INTERNAL FIXATION (ORIF) RIGHT  PROXIMAL HUMERUS FRACTURE (Right) OPEN REDUCTION INTERNAL FIXATION (ORIF) RIGHT  HUMERAL SHAFT FRACTURE (Right)  SURGEON:  Surgeon(s) and Role:    * Myrene GalasMichael Zoraida Havrilla, MD - Primary  ASSISTANTS: Harlan StainsIANE JOHNSON, RNFA   ANESTHESIA:   regional and general  EBL:  Total I/O In: 2250 [I.V.:2000; IV Piggyback:250] Out: 750 [Urine:300; Blood:450]  BLOOD ADMINISTERED:none  DRAINS: none   LOCAL MEDICATIONS USED:  NONE  SPECIMEN:  No Specimen  DISPOSITION OF SPECIMEN:  N/A  COUNTS:  YES  TOURNIQUET:  * No tourniquets in log *  DICTATION: .Other Dictation: Dictation Number [id number]  PLAN OF CARE: Discharge to home after PACU  PATIENT DISPOSITION:  PACU - hemodynamically stable.   Delay start of Pharmacological VTE agent (>24hrs) due to surgical blood loss or risk of bleeding: no

## 2016-09-02 NOTE — Consult Note (Signed)
Medical Consultation   [name]  UJW:[id number]  DOB: November 06, 1965  DOA: 09/02/2016  PCP: No PCP Per Patient    Requesting physician: HAndy, MD (Orthopedics)  Reason for consultation: Poorly managed blood sugars as outpatient    History of Present Illness: [name] is an 51 y.o. male with a history of DM for the last 10 years, non compliant with with his oral meds (Metformin) due to inability to afford them over the last 3 years, morbid obesity, admitted today fr ORIF of the R humeral shaft fracture after sustaining a mechanical fall at home. He admits to not checking his blood sugars on a regular basis.  Denies fevers, chills, night sweats, vision changes, or mucositis. Denies any respiratory complaints although he was somewhat dyspneic post op, but this is improving (he is on 2 L O2) . Denies any chest pain or palpitations. Denies lower extremity swelling. Denies nausea, heartburn or change in bowel habits.  Denies abdominal pain. Appetite is normal but admits to fatty food consumption. Denies any dysuria. Denies abnormal skin rashes, or neuropathy prior to admission . Denies any bleeding issues such as epistaxis, hematemesis, hematuria or hematochezia. Ambulating without difficulty prior to hospitalization   Review of Systems:  As per HPI otherwise 10 point review of systems negative.    Past Medical History: Past Medical History:  Diagnosis Date  . Diabetes mellitus without complication (HCC)   . Fracture closed, humerus    right    Past Surgical History: Past Surgical History:  Procedure Laterality Date  . CHOLECYSTECTOMY    . WISDOM TOOTH EXTRACTION       Allergies:   Allergies  Allergen Reactions  . No Known Allergies      Social History: Social History   Social History  . Marital status: Single    Spouse name: N/A  . Number of children: N/A  . Years of education: N/A   Occupational History  . Not on file.   Social History Main  Topics  . Smoking status: Never Smoker  . Smokeless tobacco: Never Used  . Alcohol use Yes     Comment: occasional  . Drug use: No  . Sexual activity: Not on file   Other Topics Concern  . Not on file   Social History Narrative  . No narrative on file       Family History: Family History  Problem Relation Age of Onset  . Sjogren's syndrome Mother   . CAD Father   . Diabetes Father       Physical Exam:   Constitutional: Appears calm,  alert and awake, oriented x3, not in any acute distress. Eyes: PERLA, EOMI, irises appear normal, anicteric sclera,  ENMT: external ears and nose appear normal, normal hearing or hard of hearing. Lips appears normal, oropharynx mucosa, tongue, posterior pharynx appear normal  Neck: neck appears normal, no masses, normal ROM, no thyromegaly, no JVD  CVS: S1-S2 clear, no murmur rubs or gallops, no LE edema, normal pedal pulses  Respiratory: clear to auscultation bilaterally, no wheezing, rales or rhonchi. Respiratory effort normal. No accessory muscle use.  Abdomen:  Morbidly obese, soft nontender, nondistended, normal bowel sounds, no hepatosplenomegaly, no hernias  Musculoskeletal: no cyanosis, clubbing or edema. Left arm sling  Neuro: Cranial nerves II-XII intact, strength, sensation, reflexes Psych: judgement and insight appear normal, flat mood and affect, mental status normal    Data reviewed:  I  have personally reviewed following labs and imaging studies Labs:  CBC:  Recent Labs Lab 09/02/16 0720  WBC 11.1*  NEUTROABS 8.4*  HGB 13.9  HCT 42.7  MCV 85.9  PLT 240    Basic Metabolic Panel:  Recent Labs Lab 09/02/16 0720  NA 136  K 4.0  CL 102  CO2 22  GLUCOSE 299*  BUN 11  CREATININE 0.57*  CALCIUM 9.2   GFR Estimated Creatinine Clearance: 173.9 mL/min (by C-G formula based on SCr of 0.57 mg/dL (L)). Liver Function Tests:  Recent Labs Lab 09/02/16 0720  AST 26  ALT 29  ALKPHOS 85  BILITOT 0.7  PROT 6.7    ALBUMIN 3.4*   No results for input(s): LIPASE, AMYLASE in the last 168 hours. No results for input(s): AMMONIA in the last 168 hours. Coagulation profile  Recent Labs Lab 09/02/16 0720  INR 0.95    Cardiac Enzymes: No results for input(s): CKTOTAL, CKMB, CKMBINDEX, TROPONINI in the last 168 hours. BNP: Invalid input(s): POCBNP CBG:  Recent Labs Lab 09/02/16 0645 09/02/16 1147 09/02/16 1517  GLUCAP 291* 312* 318*   D-Dimer No results for input(s): DDIMER in the last 72 hours. Hgb A1c No results for input(s): HGBA1C in the last 72 hours. Lipid Profile No results for input(s): CHOL, HDL, LDLCALC, TRIG, CHOLHDL, LDLDIRECT in the last 72 hours. Thyroid function studies No results for input(s): TSH, T4TOTAL, T3FREE, THYROIDAB in the last 72 hours.  Invalid input(s): FREET3 Anemia work up No results for input(s): VITAMINB12, FOLATE, FERRITIN, TIBC, IRON, RETICCTPCT in the last 72 hours. Urinalysis    Component Value Date/Time   COLORURINE YELLOW 09/02/2016 1300   APPEARANCEUR CLEAR 09/02/2016 1300   LABSPEC 1.025 09/02/2016 1300   PHURINE 5.0 09/02/2016 1300   GLUCOSEU >=500 (A) 09/02/2016 1300   HGBUR NEGATIVE 09/02/2016 1300   BILIRUBINUR NEGATIVE 09/02/2016 1300   KETONESUR 5 (A) 09/02/2016 1300   PROTEINUR NEGATIVE 09/02/2016 1300   NITRITE NEGATIVE 09/02/2016 1300   LEUKOCYTESUR NEGATIVE 09/02/2016 1300     Sepsis Labs Invalid input(s): PROCALCITONIN,  WBC,  LACTICIDVEN Microbiology No results found for this or any previous visit (from the past 240 hour(s)).     Inpatient Medications:   Scheduled Meds: . chlorhexidine  60 mL Topical Once  . insulin aspart      . insulin aspart  0-15 Units Subcutaneous TID WC  . insulin glargine  10 Units Subcutaneous QHS  . oxyCODONE      . promethazine       Continuous Infusions: . lactated ringers       Radiological Exams on Admission: Dg Chest Portable 1 View  Result Date: 09/02/2016 CLINICAL DATA:   Preop. EXAM: PORTABLE CHEST 1 VIEW COMPARISON:  None. FINDINGS: The heart size and mediastinal contours are within normal limits. Both lungs are clear. The visualized skeletal structures are unremarkable. IMPRESSION: No acute cardiopulmonary abnormality seen. Electronically Signed   By: Lupita Raider, M.D.   On: 09/02/2016 09:18   Dg Humerus Right  Result Date: 09/02/2016 CLINICAL DATA:  ORIF. EXAM: RIGHT HUMERUS - 2+ VIEW COMPARISON:  No recent . FINDINGS: Plate and screw fixation of the right humeral fractures noted. Anatomic alignment. Hardware intact. Postsurgical changes in the soft tissues. IMPRESSION: Plate and screw fixation of the right humeral fractures noted. Anatomic alignment. Hardware intact . Electronically Signed   By: Maisie Fus  Register   On: 09/02/2016 14:40   Dg Humerus Right  Result Date: 09/02/2016 CLINICAL DATA:  ORIF of  a comminuted right humeral fracture. EXAM: DG C-ARM 61-120 MIN; RIGHT HUMERUS - 2+ VIEW COMPARISON:  08/20/2016 FINDINGS: Operative images show placement of a fixation plate and multiple screws reducing the humeral shaft fracture into near anatomic alignment. There is no new fracture or evidence of an operative complication. IMPRESSION: Well-aligned right humeral fracture following ORIF. Electronically Signed   By: Amie Portlandavid  Ormond M.D.   On: 09/02/2016 11:37   Dg C-arm 61-120 Min  Result Date: 09/02/2016 CLINICAL DATA:  ORIF of a comminuted right humeral fracture. EXAM: DG C-ARM 61-120 MIN; RIGHT HUMERUS - 2+ VIEW COMPARISON:  08/20/2016 FINDINGS: Operative images show placement of a fixation plate and multiple screws reducing the humeral shaft fracture into near anatomic alignment. There is no new fracture or evidence of an operative complication. IMPRESSION: Well-aligned right humeral fracture following ORIF. Electronically Signed   By: Amie Portlandavid  Ormond M.D.   On: 09/02/2016 11:37    Impression/Recommendations Active Problems:   Right supracondylar humerus  fracture   Diabetes (HCC)   Essential hypertension, benign  Type II Diabetes, poorly controlled due to inability to afford meds, Current blood sugar level is 318, has been running in the 200's-300s prior to admission. UA with significant glycosuria  No results found for: HGBA1C  Hgb A1C Start Lantus 10 units, Moderate SSI  Will resume Metformin when more stable from post op standpoint, able to eat   Heart healthy carb modified diet. Diabetes educator and coordinator consult  Will need to establish care with a PCP as outpatient after discharge.  Social work consult prior to discharge as he is unable to afford his medicines.     Hypertension BP 140/63   Pulse 99 , this is post op. BP prior to admission was borderline. Was not on meds. BP may be affected by pain, inflammation as well   Will continue to monitor, patient may need to be started on med prior to discharge   Post op hypoxia, may be likely due to anesthesia, and morbid obesity  POx was in the 80s, but on admission was normal  in RA.  Currently 89% RA, in the 90s  2 L  Will consider PFTs if Osats do not improve. May need CPAP at night  monitor closely    Thank you for this consultation.  [facility] hospital[facility] team will follow his DM with you.  Other medical issues as per admitting team .   Time Spent: 40 min   [facility]ERTMAN,Damarea Merkel E PA-C [facility] 09/02/2016, 5:04 PM

## 2016-09-02 NOTE — Care Management (Signed)
Case manager [facility]. CM called [facility] and was asked to follow up on Monday, 09/05/16 to  schedule appointment for Wednesday. CM will call On Monday. Vance PeperSusan Carlee Vonderhaar, RN BSN Case Manager [phone]

## 2016-09-02 NOTE — Discharge Instructions (Signed)
Orthopaedic Trauma Service Discharge Instructions   General Discharge Instructions  WEIGHT BEARING STATUS: no lifting with right arm   RANGE OF MOTION/ACTIVITY: no active abduction of Right shoulder (no chicken wing motion). Ok to flex and extend at shoulder (front to back motion). Unrestricted motion of elbow, forearm, wrist and hand. Come out of sling several times a day to move elbow. Your elbow will get stiff if you do not  Wound Care: daily wound care starting on 09/04/2016. See below  Discharge Wound Care Instructions  Do NOT apply any ointments, solutions or lotions to pin sites or surgical wounds.  These prevent needed drainage and even though solutions like hydrogen peroxide kill bacteria, they also damage cells lining the pin sites that help fight infection.  Applying lotions or ointments can keep the wounds moist and can cause them to breakdown and open up as well. This can increase the risk for infection. When in doubt call the office.  Surgical incisions should be dressed daily.  If any drainage is noted, use one layer of adaptic, then gauze, Kerlix, and an ace wrap.  Once the incision is completely dry and without drainage, it may be left open to air out.  Showering may begin 36-48 hours later.  Cleaning gently with soap and water.  Traumatic wounds should be dressed daily as well.    One layer of adaptic, gauze, Kerlix, then ace wrap.  The adaptic can be discontinued once the draining has ceased    If you have a wet to dry dressing: wet the gauze with saline the squeeze as much saline out so the gauze is moist (not soaking wet), place moistened gauze over wound, then place a dry gauze over the moist one, followed by Kerlix wrap, then ace wrap.  PAIN MEDICATION USE AND EXPECTATIONS  You have likely been given narcotic medications to help control your pain.  After a traumatic event that results in an fracture (broken bone) with or without surgery, it is ok to use narcotic pain  medications to help control one's pain.  We understand that everyone responds to pain differently and each individual patient will be evaluated on a regular basis for the continued need for narcotic medications. Ideally, narcotic medication use should last no more than 6-8 weeks (coinciding with fracture healing).   As a patient it is your responsibility as well to monitor narcotic medication use and report the amount and frequency you use these medications when you come to your office visit.   We would also advise that if you are using narcotic medications, you should take a dose prior to therapy to maximize you participation.  IF YOU ARE ON NARCOTIC MEDICATIONS IT IS NOT PERMISSIBLE TO OPERATE A MOTOR VEHICLE (MOTORCYCLE/CAR/TRUCK/MOPED) OR HEAVY MACHINERY DO NOT MIX NARCOTICS WITH OTHER CNS (CENTRAL NERVOUS SYSTEM) DEPRESSANTS SUCH AS ALCOHOL  Diet: as you were eating previously.  Can use over the counter stool softeners and bowel preparations, such as Miralax, to help with bowel movements.  Narcotics can be constipating.  Be sure to drink plenty of fluids    STOP SMOKING OR USING NICOTINE PRODUCTS!!!!  As discussed nicotine severely impairs your body's ability to heal surgical and traumatic wounds but also impairs bone healing.  Wounds and bone heal by forming microscopic blood vessels (angiogenesis) and nicotine is a vasoconstrictor (essentially, shrinks blood vessels).  Therefore, if vasoconstriction occurs to these microscopic blood vessels they essentially disappear and are unable to deliver necessary nutrients to the healing tissue.  This is one modifiable factor that you can do to dramatically increase your chances of healing your injury.    (This means no smoking, no nicotine gum, patches, etc)  DO NOT USE NONSTEROIDAL ANTI-INFLAMMATORY DRUGS (NSAID'S)  Using products such as Advil (ibuprofen), Aleve (naproxen), Motrin (ibuprofen) for additional pain control during fracture healing can  delay and/or prevent the healing response.  If you would like to take over the counter (OTC) medication, Tylenol (acetaminophen) is ok.  However, some narcotic medications that are given for pain control contain acetaminophen as well. Therefore, you should not exceed more than 4000 mg of tylenol in a day if you do not have liver disease.  Also note that there are may OTC medicines, such as cold medicines and allergy medicines that my contain tylenol as well.  If you have any questions about medications and/or interactions please ask your doctor/PA or your pharmacist.      ICE AND ELEVATE INJURED/OPERATIVE EXTREMITY  Using ice and elevating the injured extremity above your heart can help with swelling and pain control.  Icing in a pulsatile fashion, such as 20 minutes on and 20 minutes off, can be followed.    Do not place ice directly on skin. Make sure there is a barrier between to skin and the ice pack.    Using frozen items such as frozen peas works well as the conform nicely to the are that needs to be iced.  USE AN ACE WRAP OR TED HOSE FOR SWELLING CONTROL  In addition to icing and elevation, Ace wraps or TED hose are used to help limit and resolve swelling.  It is recommended to use Ace wraps or TED hose until you are informed to stop.    When using Ace Wraps start the wrapping distally (farthest away from the body) and wrap proximally (closer to the body)   Example: If you had surgery on your leg or thing and you do not have a splint on, start the ace wrap at the toes and work your way up to the thigh        If you had surgery on your upper extremity and do not have a splint on, start the ace wrap at your fingers and work your way up to the upper arm  IF YOU ARE IN A SPLINT OR CAST DO NOT REMOVE IT FOR ANY REASON   If your splint gets wet for any reason please contact the office immediately. You may shower in your splint or cast as long as you keep it dry.  This can be done by wrapping in a  cast cover or garbage back (or similar)  Do Not stick any thing down your splint or cast such as pencils, money, or hangers to try and scratch yourself with.  If you feel itchy take benadryl as prescribed on the bottle for itching  IF YOU ARE IN A CAM BOOT (BLACK BOOT)  You may remove boot periodically. Perform daily dressing changes as noted below.  Wash the liner of the boot regularly and wear a sock when wearing the boot. It is recommended that you sleep in the boot until told otherwise  CALL THE OFFICE WITH ANY QUESTIONS OR CONCERNS: [phone]

## 2016-09-02 NOTE — Anesthesia Procedure Notes (Addendum)
Anesthesia Regional Block:  Interscalene brachial plexus block  Pre-Anesthetic Checklist: ,, timeout performed, Correct Patient, Correct Site, Correct Laterality, Correct Procedure, Correct Position, site marked, Risks and benefits discussed,  Surgical consent,  Pre-op evaluation,  At surgeon's request and post-op pain management  Laterality: Right  Prep: chloraprep       Needles:  Injection technique: Single-shot  Needle Type: Echogenic Stimulator Needle     Needle Length: 5cm 5 cm Needle Gauge: 21 and 21 G    Additional Needles:  Procedures: ultrasound guided (picture in chart) and nerve stimulator Interscalene brachial plexus block  Nerve Stimulator or Paresthesia:  Response: 0.4 mA,   Additional Responses:   Narrative:  Start time: 09/02/2016 7:55 AM End time: 09/02/2016 8:05 AM Injection made incrementally with aspirations every 5 mL.  Performed by: Personally  Anesthesiologist: Arta BruceSSEY, Lanissa Cashen  Additional Notes: Monitors applied. Patient sedated. Sterile prep and drape,hand hygiene and sterile gloves were used. Relevant anatomy identified.Needle position confirmed.Local anesthetic injected incrementally after negative aspiration. Local anesthetic spread visualized around nerve(s). Vascular puncture avoided. No complications. Image printed for medical record.The patient tolerated the procedure well.

## 2016-09-02 NOTE — Progress Notes (Signed)
Patient unable to give urine sample before surgery

## 2016-09-02 NOTE — Progress Notes (Signed)
Orthopaedic Trauma Service   Serial CBG's in PACU continue to be >300 mg/dL Pt w/o PCP  Noncompliance with metformin   Will admit to get tighter blood glucose control  Sugars >200 mg/dL increase risk of nonunion and deep infection  If pt develops deep infection it could be catastrophic   Pt also with some hypoxia in pacu as well Admit to monitor these episodes of hypoxia ?OSA  Pre-op BPs mildly elevated as well  hospitalist consult   Mearl LatinKeith W. Qunicy Higinbotham, PA-C [facility] [phone] (P) 09/02/2016 3:35 PM

## 2016-09-02 NOTE — Anesthesia Postprocedure Evaluation (Signed)
Anesthesia Post Note  Patient: Shaun KittenRobert Ayala  Procedure(s) Performed: Procedure(s) (LRB): OPEN REDUCTION INTERNAL FIXATION (ORIF) RIGHT  HUMERAL SHAFT FRACTURE (Right)  Patient location during evaluation: PACU Anesthesia Type: Regional and General Level of consciousness: awake and alert Pain management: pain level controlled Vital Signs Assessment: post-procedure vital signs reviewed and stable Respiratory status: spontaneous breathing, nonlabored ventilation, respiratory function stable and patient connected to nasal cannula oxygen Cardiovascular status: blood pressure returned to baseline and stable Postop Assessment: no signs of nausea or vomiting Anesthetic complications: no       Last Vitals:  Vitals:   09/02/16 1215 09/02/16 1230  BP: 132/61 140/63  Pulse: 92 99  Resp: (!) 27 (!) 26  Temp:      Last Pain:  Vitals:   09/02/16 0708  TempSrc:   PainSc: 4                  Jaking Thayer,E. Thai Burgueno

## 2016-09-03 ENCOUNTER — Encounter (HOSPITAL_COMMUNITY): Payer: Self-pay | Admitting: Orthopedic Surgery

## 2016-09-03 DIAGNOSIS — I10 Essential (primary) hypertension: Secondary | ICD-10-CM

## 2016-09-03 DIAGNOSIS — E118 Type 2 diabetes mellitus with unspecified complications: Secondary | ICD-10-CM

## 2016-09-03 DIAGNOSIS — S42201A Unspecified fracture of upper end of right humerus, initial encounter for closed fracture: Secondary | ICD-10-CM

## 2016-09-03 HISTORY — DX: Unspecified fracture of upper end of right humerus, initial encounter for closed fracture: S42.201A

## 2016-09-03 LAB — BASIC METABOLIC PANEL
Anion gap: 10 (ref 5–15)
BUN: 11 mg/dL (ref 6–20)
CHLORIDE: 100 mmol/L — AB (ref 101–111)
CO2: 24 mmol/L (ref 22–32)
CREATININE: 0.68 mg/dL (ref 0.61–1.24)
Calcium: 8.9 mg/dL (ref 8.9–10.3)
GFR calc Af Amer: >60 mL/min (ref >60)
GFR calc non Af Amer: >60 mL/min (ref >60)
Glucose, Bld: 277 mg/dL — ABNORMAL HIGH (ref 65–99)
Potassium: 3.8 mmol/L (ref 3.5–5.1)
SODIUM: 134 mmol/L — AB (ref 135–145)

## 2016-09-03 LAB — GLUCOSE, CAPILLARY
Glucose-Capillary: 251 mg/dL — ABNORMAL HIGH (ref 65–99)
Glucose-Capillary: 218 mg/dL — ABNORMAL HIGH (ref 65–99)
Glucose-Capillary: 313 mg/dL — ABNORMAL HIGH (ref 65–99)

## 2016-09-03 LAB — HEMOGLOBIN A1C
Hgb A1c MFr Bld: 11.7 % — ABNORMAL HIGH (ref 4.8–5.6)
Mean Plasma Glucose: 289 mg/dL

## 2016-09-03 LAB — VITAMIN D 25 HYDROXY (VIT D DEFICIENCY, FRACTURES): Vit D, 25-Hydroxy: 12.2 ng/mL — ABNORMAL LOW (ref 30.0–100.0)

## 2016-09-03 MED ORDER — FREESTYLE LANCETS MISC: 0 refills | Status: DC

## 2016-09-03 MED ORDER — LISINOPRIL 10 MG PO TABS
10.0000 mg | ORAL_TABLET | Freq: Every day | ORAL | Status: DC
  Administered 2016-09-03: 10 mg via ORAL
  Filled 2016-09-03: qty 1

## 2016-09-03 MED ORDER — INSULIN GLARGINE 100 UNIT/ML ~~LOC~~ SOLN
25.0000 [IU] | Freq: Every day | SUBCUTANEOUS | Status: DC
  Filled 2016-09-03: qty 0.25

## 2016-09-03 MED ORDER — INSULIN ASPART 100 UNIT/ML FLEXPEN: PEN_INJECTOR | SUBCUTANEOUS | 0 refills | Status: DC

## 2016-09-03 MED ORDER — METFORMIN HCL 500 MG PO TABS: 500.0000 mg | ORAL_TABLET | Freq: Two times a day (BID) | ORAL | 0 refills | Status: DC

## 2016-09-03 MED ORDER — INSULIN GLARGINE 100 UNIT/ML SOLOSTAR PEN: 26.0000 [IU] | PEN_INJECTOR | Freq: Every day | SUBCUTANEOUS | 0 refills | Status: DC

## 2016-09-03 MED ORDER — GLUCOSE BLOOD VI STRP: ORAL_STRIP | 0 refills | Status: DC

## 2016-09-03 MED ORDER — FREESTYLE SYSTEM KIT: 1.0000 | PACK | Freq: Three times a day (TID) | 1 refills | Status: DC

## 2016-09-03 MED ORDER — LISINOPRIL 10 MG PO TABS: 10.0000 mg | ORAL_TABLET | Freq: Every day | ORAL | 0 refills | Status: DC

## 2016-09-03 MED ORDER — INSULIN PEN NEEDLE 32G X 8 MM MISC: 0 refills | Status: DC

## 2016-09-03 MED ORDER — INSULIN ASPART 100 UNIT/ML ~~LOC~~ SOLN
6.0000 [IU] | Freq: Three times a day (TID) | SUBCUTANEOUS | Status: DC
  Administered 2016-09-03 (×2): 6 [IU] via SUBCUTANEOUS

## 2016-09-03 NOTE — Op Note (Signed)
[name], Ayala               ACCOUNT NO.:  [id number]  MEDICAL RECORD NO.:  [id number]  LOCATION:  [id number]                        FACILITY:  [facility]  PHYSICIAN:  [name], M.D. DATE OF BIRTH:  [birth year]  DATE OF PROCEDURE:  09/02/2016 DATE OF DISCHARGE:                              OPERATIVE REPORT   PREOPERATIVE DIAGNOSES: 1. Right proximal humerus fracture. 2. Right humeral shaft fracture.  POSTOPERATIVE DIAGNOSES: 1. Right proximal humerus fracture. 2. Right humeral shaft fracture.  PROCEDURES: 1. ORIF of right proximal humerus. 2. ORIF of right comminuted humeral shaft fracture.  SURGEON:  [name], M.D.  ASSISTANT:  [name], RNFA.  ANESTHESIA:  Regional and general, [name], M.D.  I/O:  IVF 2000, colloid 250/UOP 300.  EBL:  400.  DRAINS:  None.  TOURNIQUET:  None.  DISPOSITION:  To PACU.  CONDITION:  Stable.  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 He had failure to obtain and maintained reduction with significant varus.  We discussed with him the risks and benefits of surgical repair including the potential for radial nerve injury, infection, other nerve or vessel injury, need for further surgery, DVT, PE, loss of motion, heart attack, stroke, anesthetic complications, and others.  A full discussion, he strongly wished to proceed with surgical repair.  BRIEF SUMMARY OF PROCEDURE:  The patient was given preoperative antibiotics, which consisted of 3 g of Ancef, taken to the operating room where general anesthesia was induced.  He did receive a regional block prior to this.  The right upper extremity was prepped and draped in the usual sterile fashion.  No tourniquet was used.  For the proximal humerus, a deltopectoral approach was made for the distal  humerus.  We did an anterior approach to completely access the shaft, which was extended down just above the antecubital fossa.  Proximally, we identified the deltopectoral interval and developed this to gain exposure to the fracture site.  Distally, we retracted the biceps medially, split the brachialis midline and they were able to expose the fracture site.  The reduction of the proximal humerus was facilitated with rotation and compression.  We were able to see this interdigitate holding it provisionally with K-wires.  Distally, the comminuted shaft was far more difficult and this required considerable amount of time using a curette, various and sundry clamps, all of which were placed under direct visualization, being careful to protect the neurovascular bundles medially as well as the radial nerve specifically.  Ultimately we were finally able with the help of my assistant to obtain reduction and then this was secured initially with a clamp fixation in order to allow for plate placement.  These were swapped for cerclage wires which were placed directly on the bone.  These did then enable me to remove the clamps, placed the plate, secure it proximally into the proximal humerus fracture by first placing standard screws to maximally appose the plate to the bone, then screws directed across the fracture into the anatomic head region.  All screws  were checked for position on orthogonal as well as live fluoro views of the head.  Distally, standard bicortical screws were placed initially, which brought the plate to the bone and then 1 locked bicortical screw as well.  Final images showed appropriate reduction and hardware placement for both the proximal humerus and the comminuted shaft with excellent restoration of alignment.  Wounds were irrigated thoroughly, closed in standard layered fashion using #1 Vicryl, 0 Vicryl, 2-0 Vicryl, 3-0 nylon.  Mepilex dressing was applied.  The Foley  catheter was removed.  The patient was awakened from anesthesia and transferred to PACU in stable condition. There were no complications.  PROGNOSIS:  The patient will be allowed unrestricted elbow motion with 2 pounds lifting restriction, to be in a sling for comfort, begin pendulum, return in 10-14 days for removal of his sutures.  At that point, he will begin the progression of range of motion to active assisted without resistance.  Given his diabetes, he is at significantly elevated risk of infection and nonunion.     Shaun AlbinoMichael H. Carola FrostHandy, M.D.     MHH/MEDQ  D:  09/02/2016  T:  09/03/2016  Job:  [id number]

## 2016-09-03 NOTE — Discharge Summary (Signed)
Physician Discharge Summary  Patient ID: [name] MRN: [id number] DOB/AGE: July 25, 1966 51 y.o.  Admit date: 09/02/2016 Discharge date: 09/03/2016  Admission Diagnoses:  Fracture of humeral shaft, right, closed  Discharge Diagnoses:  Principal Problem:   Fracture of humeral shaft, right, closed Active Problems:   Diabetes ([place])   Essential hypertension, benign   Fracture of humerus, proximal, right, closed   Past Medical History:  Diagnosis Date  . Diabetes mellitus without complication ([place])   . Fracture closed, humerus    right  . Fracture of humeral shaft, right, closed 09/02/2016  . Fracture of humerus, proximal, right, closed 09/03/2016    Surgeries: Procedure(s): OPEN REDUCTION INTERNAL FIXATION (ORIF) RIGHT  HUMERAL SHAFT FRACTURE on 09/02/2016   Consultants (if any): [facility] for blood sugar control  Discharged Condition: Improved  Hospital Course: [name] is an 51 y.o. male who was admitted 09/02/2016 with a diagnosis of Fracture of humeral shaft, right, closed and went to the operating room on 09/02/2016 and underwent the above named procedures.    He was given perioperative antibiotics:  Anti-infectives    Start     Dose/Rate Route Frequency Ordered Stop   09/02/16 1800  ceFAZolin (ANCEF) IVPB 1 g/50 mL premix     1 g 100 mL/hr over 30 Minutes Intravenous Every 6 hours 09/02/16 1749 09/03/16 0606   09/02/16 0730  ceFAZolin (ANCEF) 3 g in dextrose 5 % 50 mL IVPB     3 g 130 mL/hr over 30 Minutes Intravenous To ShortStay Surgical 09/01/16 1513 09/02/16 0816    .  He was given sequential compression devices, early ambulation,  for DVT prophylaxis.  He benefited maximally from the hospital stay and there were no complications.    Recent vital signs:  Vitals:   09/03/16 0455 09/03/16 1200  BP: 133/75 (!) 146/77  Pulse: 90   Resp: 16   Temp: 98.8 F (37.1 C)     Recent laboratory studies:  Lab Results  Component Value Date   HGB  13.9 09/02/2016   HGB 14.9 08/20/2016   Lab Results  Component Value Date   WBC 11.1 (H) 09/02/2016   PLT 240 09/02/2016   Lab Results  Component Value Date   INR 0.95 09/02/2016   Lab Results  Component Value Date   NA 134 (L) 09/03/2016   K 3.8 09/03/2016   CL 100 (L) 09/03/2016   CO2 24 09/03/2016   BUN 11 09/03/2016   CREATININE 0.68 09/03/2016   GLUCOSE 277 (H) 09/03/2016    Discharge Medications:   Allergies as of 09/03/2016      Reactions   No Known Allergies       Medication List    STOP taking these medications   ibuprofen 200 MG tablet Commonly known as:  ADVIL,MOTRIN     TAKE these medications   freestyle lancets Use as instructed   glucose blood test strip Use as instructed   glucose monitoring kit monitoring kit 1 each by Does not apply route 4 (four) times daily - after meals and at bedtime. 1 month Diabetic Testing Supplies for QAC-QHS accuchecks.   insulin aspart 100 UNIT/ML FlexPen Commonly known as:  NOVOLOG FLEXPEN 0-15 Units, Subcutaneous, 3 times daily with meals CBG < 70: implement hypoglycemia protocol-call MD CBG 70 - 120: 0 units CBG 121 - 150: 2 units CBG 151 - 200: 3 units CBG 201 - 250: 5 units CBG 251 - 300: 8 units CBG 301 - 350: 11 units CBG  351 - 400: 15 units CBG > 400:   Insulin Glargine 100 UNIT/ML Solostar Pen Commonly known as:  LANTUS Inject 26 Units into the skin daily at 10 pm.   Insulin Pen Needle 32G X 8 MM Misc Use as directed   lisinopril 10 MG tablet Commonly known as:  PRINIVIL,ZESTRIL Take 1 tablet (10 mg total) by mouth daily.   metFORMIN 500 MG tablet Commonly known as:  GLUCOPHAGE Take 1 tablet (500 mg total) by mouth 2 (two) times daily with a meal.   oxyCODONE 5 MG immediate release tablet Commonly known as:  ROXICODONE Take 1-2 tablets (5-10 mg total) by mouth every 6 (six) hours as needed for breakthrough pain (take between percocet for breakthrough pain only).   oxyCODONE-acetaminophen 10-325 MG  tablet Commonly known as:  PERCOCET Take 1 tablet by mouth every 6 (six) hours as needed for pain. What changed:  Another medication with the same name was removed. Continue taking this medication, and follow the directions you see here.   promethazine 12.5 MG tablet Commonly known as:  PHENERGAN Take 1-2 tablets (12.5-25 mg total) by mouth every 6 (six) hours as needed for nausea or vomiting.       Diagnostic Studies: Dg Chest Portable 1 View  Result Date: 09/02/2016 CLINICAL DATA:  Preop. EXAM: PORTABLE CHEST 1 VIEW COMPARISON:  None. FINDINGS: The heart size and mediastinal contours are within normal limits. Both lungs are clear. The visualized skeletal structures are unremarkable. IMPRESSION: No acute cardiopulmonary abnormality seen. Electronically Signed   By: Marijo Conception, M.D.   On: 09/02/2016 09:18   Dg Humerus Right  Result Date: 09/02/2016 CLINICAL DATA:  ORIF. EXAM: RIGHT HUMERUS - 2+ VIEW COMPARISON:  No recent . FINDINGS: Plate and screw fixation of the right humeral fractures noted. Anatomic alignment. Hardware intact. Postsurgical changes in the soft tissues. IMPRESSION: Plate and screw fixation of the right humeral fractures noted. Anatomic alignment. Hardware intact . Electronically Signed   By: Marcello Moores  Register   On: 09/02/2016 14:40   Dg Humerus Right  Result Date: 09/02/2016 CLINICAL DATA:  ORIF of a comminuted right humeral fracture. EXAM: DG C-ARM 61-120 MIN; RIGHT HUMERUS - 2+ VIEW COMPARISON:  08/20/2016 FINDINGS: Operative images show placement of a fixation plate and multiple screws reducing the humeral shaft fracture into near anatomic alignment. There is no new fracture or evidence of an operative complication. IMPRESSION: Well-aligned right humeral fracture following ORIF. Electronically Signed   By: Lajean Manes M.D.   On: 09/02/2016 11:37   Dg Humerus Right  Result Date: 08/20/2016 CLINICAL DATA:  Right arm pain after a fall at work today. EXAM: RIGHT  HUMERUS - 2+ VIEW COMPARISON:  None. FINDINGS: Comminuted fracture of the proximal humeral diaphysis is identified with multiple large fracture fragments evident. No substantial angulation at the fracture site although approximately 1 cortex width of fracture fragment distraction is evident. IMPRESSION: Comminuted fracture proximal humerus. Electronically Signed   By: Misty Stanley M.D.   On: 08/20/2016 21:41   Dg C-arm 61-120 Min  Result Date: 09/02/2016 CLINICAL DATA:  ORIF of a comminuted right humeral fracture. EXAM: DG C-ARM 61-120 MIN; RIGHT HUMERUS - 2+ VIEW COMPARISON:  08/20/2016 FINDINGS: Operative images show placement of a fixation plate and multiple screws reducing the humeral shaft fracture into near anatomic alignment. There is no new fracture or evidence of an operative complication. IMPRESSION: Well-aligned right humeral fracture following ORIF. Electronically Signed   By: Dedra Skeens.D.  On: 09/02/2016 11:37    Disposition: 01-Home or Self Care  Discharge Instructions    Call MD / Call 911    Complete by:  As directed    If you experience chest pain or shortness of breath, CALL 911 and be transported to the hospital emergency room.  If you develope a fever above 101 F, pus (white drainage) or increased drainage or redness at the wound, or calf pain, call your surgeon's office.   Constipation Prevention    Complete by:  As directed    Drink plenty of fluids.  Prune juice may be helpful.  You may use a stool softener, such as Colace (over the counter) 100 mg twice a day.  Use MiraLax (over the counter) for constipation as needed.   Diet Carb Modified    Complete by:  As directed    Discharge instructions    Complete by:  As directed    Orthopaedic Trauma Service Discharge Instructions   General Discharge Instructions  WEIGHT BEARING STATUS: no lifting with right arm   RANGE OF MOTION/ACTIVITY: no active abduction of Right shoulder (no chicken wing motion). Ok to flex  and extend at shoulder (front to back motion). Unrestricted motion of elbow, forearm, wrist and hand. Come out of sling several times a day to move elbow. Your elbow will get stiff if you do not  Wound Care: daily wound care starting on 09/04/2016. See below  Discharge Wound Care Instructions  Do NOT apply any ointments, solutions or lotions to pin sites or surgical wounds.  These prevent needed drainage and even though solutions like hydrogen peroxide kill bacteria, they also damage cells lining the pin sites that help fight infection.  Applying lotions or ointments can keep the wounds moist and can cause them to breakdown and open up as well. This can increase the risk for infection. When in doubt call the office.  Surgical incisions should be dressed daily.  If any drainage is noted, use one layer of adaptic, then gauze, Kerlix, and an ace wrap.  Once the incision is completely dry and without drainage, it may be left open to air out.  Showering may begin 36-48 hours later.  Cleaning gently with soap and water.  Traumatic wounds should be dressed daily as well.    One layer of adaptic, gauze, Kerlix, then ace wrap.  The adaptic can be discontinued once the draining has ceased    If you have a wet to dry dressing: wet the gauze with saline the squeeze as much saline out so the gauze is moist (not soaking wet), place moistened gauze over wound, then place a dry gauze over the moist one, followed by Kerlix wrap, then ace wrap.  PAIN MEDICATION USE AND EXPECTATIONS  You have likely been given narcotic medications to help control your pain.  After a traumatic event that results in an fracture (broken bone) with or without surgery, it is ok to use narcotic pain medications to help control one's pain.  We understand that everyone responds to pain differently and each individual patient will be evaluated on a regular basis for the continued need for narcotic medications. Ideally, narcotic medication  use should last no more than 6-8 weeks (coinciding with fracture healing).   As a patient it is your responsibility as well to monitor narcotic medication use and report the amount and frequency you use these medications when you come to your office visit.   We would also advise that if you are  using narcotic medications, you should take a dose prior to therapy to maximize you participation.  IF YOU ARE ON NARCOTIC MEDICATIONS IT IS NOT PERMISSIBLE TO OPERATE A MOTOR VEHICLE (MOTORCYCLE/CAR/TRUCK/MOPED) OR HEAVY MACHINERY DO NOT MIX NARCOTICS WITH OTHER CNS (CENTRAL NERVOUS SYSTEM) DEPRESSANTS SUCH AS ALCOHOL  Diet: as you were eating previously.  Can use over the counter stool softeners and bowel preparations, such as Miralax, to help with bowel movements.  Narcotics can be constipating.  Be sure to drink plenty of fluids    STOP SMOKING OR USING NICOTINE PRODUCTS!!!!  As discussed nicotine severely impairs your body's ability to heal surgical and traumatic wounds but also impairs bone healing.  Wounds and bone heal by forming microscopic blood vessels (angiogenesis) and nicotine is a vasoconstrictor (essentially, shrinks blood vessels).  Therefore, if vasoconstriction occurs to these microscopic blood vessels they essentially disappear and are unable to deliver necessary nutrients to the healing tissue.  This is one modifiable factor that you can do to dramatically increase your chances of healing your injury.    (This means no smoking, no nicotine gum, patches, etc)  DO NOT USE NONSTEROIDAL ANTI-INFLAMMATORY DRUGS (NSAID'S)  Using products such as Advil (ibuprofen), Aleve (naproxen), Motrin (ibuprofen) for additional pain control during fracture healing can delay and/or prevent the healing response.  If you would like to take over the counter (OTC) medication, Tylenol (acetaminophen) is ok.  However, some narcotic medications that are given for pain control contain acetaminophen as well.  Therefore, you should not exceed more than 4000 mg of tylenol in a day if you do not have liver disease.  Also note that there are may OTC medicines, such as cold medicines and allergy medicines that my contain tylenol as well.  If you have any questions about medications and/or interactions please ask your doctor/PA or your pharmacist.      ICE AND ELEVATE INJURED/OPERATIVE EXTREMITY  Using ice and elevating the injured extremity above your heart can help with swelling and pain control.  Icing in a pulsatile fashion, such as 20 minutes on and 20 minutes off, can be followed.    Do not place ice directly on skin. Make sure there is a barrier between to skin and the ice pack.    Using frozen items such as frozen peas works well as the conform nicely to the are that needs to be iced.  USE AN ACE WRAP OR TED HOSE FOR SWELLING CONTROL  In addition to icing and elevation, Ace wraps or TED hose are used to help limit and resolve swelling.  It is recommended to use Ace wraps or TED hose until you are informed to stop.    When using Ace Wraps start the wrapping distally (farthest away from the body) and wrap proximally (closer to the body)   Example: If you had surgery on your leg or thing and you do not have a splint on, start the ace wrap at the toes and work your way up to the thigh        If you had surgery on your upper extremity and do not have a splint on, start the ace wrap at your fingers and work your way up to the upper arm  IF YOU ARE IN A SPLINT OR CAST DO NOT [place]   If your splint gets wet for any reason please contact the office immediately. You may shower in your splint or cast as long as you keep it dry.  This can be done by wrapping in a cast cover or garbage back (or similar)  Do Not stick any thing down your splint or cast such as pencils, money, or hangers to try and scratch yourself with.  If you feel itchy take benadryl as prescribed on the bottle for itching  IF  YOU ARE IN A CAM BOOT (BLACK BOOT)  You may remove boot periodically. Perform daily dressing changes as noted below.  Wash the liner of the boot regularly and wear a sock when wearing the boot. It is recommended that you sleep in the boot until told otherwise  CALL THE OFFICE WITH ANY QUESTIONS OR CONCERNS: [phone]   Driving restrictions    Complete by:  As directed    No driving   Increase activity slowly as tolerated    Complete by:  As directed    Lifting restrictions    Complete by:  As directed    No lifting      Follow-up Information    HANDY,MICHAEL H, MD Follow up in 2 week(s).   Specialty:  Orthopedic Surgery Why:  For suture removal, For wound re-check Contact information: [address] [place] [address] [phone]        [facility] Follow up.   Why:  call on 1/29-and make an appointment in 1-2 weeks. Please let them know you were discharged from the hospital. Contact information: [place] [address] [phone]           Signed: Johnny Bridge 09/03/2016, 12:43 PM

## 2016-09-03 NOTE — Progress Notes (Signed)
PROGRESS NOTE        PATIENT DETAILS Name: [name] Age: 51 y.o. Sex: male Date of Birth: [birth year] Admit Date: 09/02/2016 Admitting Physician Myrene Galas, MD PCP:No PCP Per Patient  Brief Narrative: Patient is a 51 y.o. male with hx of DM-2 non compliant to medications-admitted to the Ortho service following repair of right humerus fracture. [facility] consulted for management of DM-2. See below for details.  Subjective: Sitting comfortably at bedside-no complaints. Acknowledges being off diabetic medications for the past 3 years. His last A1c a few years ago was down to 6.5.  Assessment/Plan: Type 2 diabetes with uncontrolled hyperglycemia: Hemoglobin A1c is 11.7, will ideally need to be on insulin. Increase Lantus to 25 units, increase pre meal NovoLog to 6 units. Continue with metformin. Have asked RN to consult care management to see if patient can be provided with medication assistance programs. Also have asked patient to call [facility] on Monday (currently closed) and see if he can get a follow-up appointment.  Further recommendations-to follow once evaluated by case management.  Hypertension: Given history of diabetes-recommend starting low-dose lisinopril. Further titration can be done in the outpatient setting.  Probable OSA: Will need outpatient sleep study.  Noncompliance to medications/follow-up: I have urged patient to make an appointment at [facility] on Monday when it opens. Emphasized/counseled regarding importance of compliance to medications and long-term harmful effects of uncontrolled diabetes.  Disposition Plan: Per primary service  Antimicrobial agents: Anti-infectives    Start     Dose/Rate Route Frequency Ordered Stop   09/02/16 1800  ceFAZolin (ANCEF) IVPB 1 g/50 mL premix     1 g 100 mL/hr over 30 Minutes Intravenous Every 6 hours 09/02/16 1749 09/03/16 0606   09/02/16 0730   ceFAZolin (ANCEF) 3 g in dextrose 5 % 50 mL IVPB     3 g 130 mL/hr over 30 Minutes Intravenous To ShortStay Surgical 09/01/16 1513 09/02/16 0816      Time spent: 25- minutes-Greater than 50% of this time was spent in counseling, explanation of diagnosis, planning of further management, and coordination of care.  MEDICATIONS: Scheduled Meds: . docusate sodium  100 mg Oral BID  . enoxaparin (LOVENOX) injection  40 mg Subcutaneous Q24H  . insulin aspart  0-15 Units Subcutaneous TID WC  . insulin aspart  0-5 Units Subcutaneous QHS  . insulin aspart  6 Units Subcutaneous TID WC  . insulin glargine  25 Units Subcutaneous QHS  . metFORMIN  500 mg Oral BID WC  . polyethylene glycol  17 g Oral Daily   Continuous Infusions: . 0.9 % NaCl with KCl 20 mEq / L 100 mL/hr at 09/03/16 0747   PRN Meds:.acetaminophen **OR** acetaminophen, HYDROmorphone (DILAUDID) injection, methocarbamol **OR** methocarbamol (ROBAXIN)  IV, metoCLOPramide **OR** metoCLOPramide (REGLAN) injection, ondansetron **OR** ondansetron (ZOFRAN) IV, oxyCODONE-acetaminophen **AND** oxyCODONE, oxyCODONE   PHYSICAL EXAM: Vital signs: Vitals:   09/02/16 1830 09/02/16 2010 09/03/16 0007 09/03/16 0455  BP: 126/68 (!) 145/74 125/72 133/75  Pulse: 90 94 93 90  Resp: 16 16 16 16   Temp: 99.5 F (37.5 C) 99.5 F (37.5 C) 98.2 F (36.8 C) 98.8 F (37.1 C)  TempSrc: Oral Oral Oral Oral  SpO2: 99% 96% 93% 97%   There were no vitals filed for this visit. There is no height or weight on file  to calculate BMI.   General appearance :Awake, alert, not in any distress. Speech Clear. Not toxic Looking Eyes:, pupils equally reactive to light and accomodation,no scleral icterus.Pink conjunctiva HEENT: Atraumatic and Normocephalic Neck: supple, no JVD. No cervical lymphadenopathy. No thyromegaly Resp:Good air entry bilaterally, no added sounds  CVS: S1 S2 regular, no murmurs.  GI: Bowel sounds present, Non tender and not distended with  no gaurding, rigidity or rebound.No organomegaly Extremities: B/L Lower Ext shows no edema, both legs are warm to touch Neurology:  speech clear,Non focal, sensation is grossly intact. Psychiatric: Normal judgment and insight. Alert and oriented x 3. Normal mood. Musculoskeletal:No digital cyanosis Skin:No Rash, warm and dry Wounds:N/A  I have personally reviewed following labs and imaging studies  LABORATORY DATA: CBC:  Recent Labs Lab 09/02/16 0720  WBC 11.1*  NEUTROABS 8.4*  HGB 13.9  HCT 42.7  MCV 85.9  PLT 240    Basic Metabolic Panel:  Recent Labs Lab 09/02/16 0720  NA 136  K 4.0  CL 102  CO2 22  GLUCOSE 299*  BUN 11  CREATININE 0.57*  CALCIUM 9.2    GFR: Estimated Creatinine Clearance: 173.9 mL/min (by C-G formula based on SCr of 0.57 mg/dL (L)).  Liver Function Tests:  Recent Labs Lab 09/02/16 0720  AST 26  ALT 29  ALKPHOS 85  BILITOT 0.7  PROT 6.7  ALBUMIN 3.4*   No results for input(s): LIPASE, AMYLASE in the last 168 hours. No results for input(s): AMMONIA in the last 168 hours.  Coagulation Profile:  Recent Labs Lab 09/02/16 0720  INR 0.95    Cardiac Enzymes: No results for input(s): CKTOTAL, CKMB, CKMBINDEX, TROPONINI in the last 168 hours.  BNP (last 3 results) No results for input(s): PROBNP in the last 8760 hours.  HbA1C:  Recent Labs  09/02/16 0720  HGBA1C 11.7*    CBG:  Recent Labs Lab 09/02/16 1147 09/02/16 1517 09/02/16 1823 09/02/16 2111 09/03/16 0612  GLUCAP 312* 318* 337* 281* 251*    Lipid Profile: No results for input(s): CHOL, HDL, LDLCALC, TRIG, CHOLHDL, LDLDIRECT in the last 72 hours.  Thyroid Function Tests: No results for input(s): TSH, T4TOTAL, FREET4, T3FREE, THYROIDAB in the last 72 hours.  Anemia Panel: No results for input(s): VITAMINB12, FOLATE, FERRITIN, TIBC, IRON, RETICCTPCT in the last 72 hours.  Urine analysis:    Component Value Date/Time   COLORURINE YELLOW 09/02/2016 1300    APPEARANCEUR CLEAR 09/02/2016 1300   LABSPEC 1.025 09/02/2016 1300   PHURINE 5.0 09/02/2016 1300   GLUCOSEU >=500 (A) 09/02/2016 1300   HGBUR NEGATIVE 09/02/2016 1300   BILIRUBINUR NEGATIVE 09/02/2016 1300   KETONESUR 5 (A) 09/02/2016 1300   PROTEINUR NEGATIVE 09/02/2016 1300   NITRITE NEGATIVE 09/02/2016 1300   LEUKOCYTESUR NEGATIVE 09/02/2016 1300    Sepsis Labs: Lactic Acid, Venous No results found for: LATICACIDVEN  MICROBIOLOGY: No results found for this or any previous visit (from the past 240 hour(s)).  RADIOLOGY STUDIES/RESULTS: Dg Chest Portable 1 View  Result Date: 09/02/2016 CLINICAL DATA:  Preop. EXAM: PORTABLE CHEST 1 VIEW COMPARISON:  None. FINDINGS: The heart size and mediastinal contours are within normal limits. Both lungs are clear. The visualized skeletal structures are unremarkable. IMPRESSION: No acute cardiopulmonary abnormality seen. Electronically Signed   By: Lupita RaiderJames  Green Jr, M.D.   On: 09/02/2016 09:18   Dg Humerus Right  Result Date: 09/02/2016 CLINICAL DATA:  ORIF. EXAM: RIGHT HUMERUS - 2+ VIEW COMPARISON:  No recent . FINDINGS: Plate and screw fixation  of the right humeral fractures noted. Anatomic alignment. Hardware intact. Postsurgical changes in the soft tissues. IMPRESSION: Plate and screw fixation of the right humeral fractures noted. Anatomic alignment. Hardware intact . Electronically Signed   By: Maisie Fus  Register   On: 09/02/2016 14:40   Dg Humerus Right  Result Date: 09/02/2016 CLINICAL DATA:  ORIF of a comminuted right humeral fracture. EXAM: DG C-ARM 61-120 MIN; RIGHT HUMERUS - 2+ VIEW COMPARISON:  08/20/2016 FINDINGS: Operative images show placement of a fixation plate and multiple screws reducing the humeral shaft fracture into near anatomic alignment. There is no new fracture or evidence of an operative complication. IMPRESSION: Well-aligned right humeral fracture following ORIF. Electronically Signed   By: Amie Portland M.D.   On:  09/02/2016 11:37   Dg Humerus Right  Result Date: 08/20/2016 CLINICAL DATA:  Right arm pain after a fall at work today. EXAM: RIGHT HUMERUS - 2+ VIEW COMPARISON:  None. FINDINGS: Comminuted fracture of the proximal humeral diaphysis is identified with multiple large fracture fragments evident. No substantial angulation at the fracture site although approximately 1 cortex width of fracture fragment distraction is evident. IMPRESSION: Comminuted fracture proximal humerus. Electronically Signed   By: Kennith Center M.D.   On: 08/20/2016 21:41   Dg C-arm 61-120 Min  Result Date: 09/02/2016 CLINICAL DATA:  ORIF of a comminuted right humeral fracture. EXAM: DG C-ARM 61-120 MIN; RIGHT HUMERUS - 2+ VIEW COMPARISON:  08/20/2016 FINDINGS: Operative images show placement of a fixation plate and multiple screws reducing the humeral shaft fracture into near anatomic alignment. There is no new fracture or evidence of an operative complication. IMPRESSION: Well-aligned right humeral fracture following ORIF. Electronically Signed   By: Amie Portland M.D.   On: 09/02/2016 11:37     LOS: 0 days   Jeoffrey Massed, MD  [facility] Pager:[phone]  If 7PM-7AM, please contact night-coverage [email or web address] Password [facility] 09/03/2016, 8:56 AM

## 2016-09-03 NOTE — Evaluation (Signed)
Occupational Therapy Evaluation and Discharge Patient Details Name: [name] MRN: [id number] DOB: Jan 23, 1966 Today's Date: 09/03/2016    History of Present Illness Pt is a 51 y/o male s/p ORIF RIGHT  PROXIMAL HUMERUS FRACTURE (Right) ORIF RIGHT  HUMERAL SHAFT FRACTURE (Right) from a fall he sustained while carrying a load of full baking trays. Pt has a past medical history of Diabetes mellitus without complication.    Clinical Impression   PTA Pt independent in IADL/ADL and mobility. Pt runs a catering business, driving. Pt currently max assist for BUE ADL, and mod I/supervision for mobility. Pt fully assessed and received all education. Pt provided with shoulder d/c handout and explained differences. Pt with no questions or concerns at end of session, and OT to sign off at this time. Thank you for this referral.     Follow Up Recommendations  No OT follow up;Supervision/Assistance - 24 hour (initially)    Equipment Recommendations  None recommended by OT    Recommendations for Other Services       Precautions / Restrictions Precautions Precautions: Fall Precaution Comments: No Active Range of motion from shoulder,  Restrictions Weight Bearing Restrictions: Yes RUE Weight Bearing: Non weight bearing Other Position/Activity Restrictions: no active range of motion at shoulder Pt reported from MD      Mobility Bed Mobility Overal bed mobility: Modified Independent             General bed mobility comments: Pt requires increased time, no assist or cueing needed  Transfers Overall transfer level: Modified independent Equipment used: None             General transfer comment: No attempt to push up with RUE    Balance Overall balance assessment: No apparent balance deficits (not formally assessed)                                          ADL Overall ADL's : Needs assistance/impaired     Grooming: Wash/dry face;Oral care;Brushing  hair;Set up;Standing Grooming Details (indicate cue type and reason): sink level Upper Body Bathing: Moderate assistance Upper Body Bathing Details (indicate cue type and reason): Pt educated in technique to clean underarm without AROM at shoulder Lower Body Bathing: Min guard   Upper Body Dressing : Maximal assistance;Sitting Upper Body Dressing Details (indicate cue type and reason): Pt educated to dress RUE first, and to wear sling over clothes     Toilet Transfer: Supervision/safety;Comfort height toilet;Ambulation       Tub/ Shower Transfer: Tub transfer;Min guard;With caregiver independent assisting;Ambulation   Functional mobility during ADLs: Supervision/safety General ADL Comments: Pt will have family support 24 hours a day for assist with BUE activities, Pt educated in hand, wrist, and elbow exercises     Vision Vision Assessment?: No apparent visual deficits   Perception     Praxis      Pertinent Vitals/Pain Pain Assessment: 0-10 Pain Score: 9  Pain Location: RUE Pain Descriptors / Indicators: Constant Pain Intervention(s): Monitored during session;Repositioned;Premedicated before session;Ice applied     Hand Dominance  (ambidextrous)   Extremity/Trunk Assessment Upper Extremity Assessment Upper Extremity Assessment: RUE deficits/detail RUE Deficits / Details: s/p deficits in strength and ROM RUE: Unable to fully assess due to pain;Unable to fully assess due to immobilization   Lower Extremity Assessment Lower Extremity Assessment: Overall WFL for tasks assessed   Cervical / Trunk Assessment Cervical /  Trunk Assessment: Kyphotic   Communication Communication Communication: No difficulties   Cognition Arousal/Alertness: Awake/alert Behavior During Therapy: WFL for tasks assessed/performed Overall Cognitive Status: Within Functional Limits for tasks assessed                     General Comments       Exercises Exercises: Shoulder      Shoulder Instructions Shoulder Instructions Donning/doffing shirt without moving shoulder: Maximal assistance;Patient able to independently direct caregiver Method for sponge bathing under operated UE: Supervision/safety;Patient able to independently direct caregiver Donning/doffing sling/immobilizer: Moderate assistance;Patient able to independently direct caregiver Correct positioning of sling/immobilizer: Moderate assistance;Patient able to independently direct caregiver ROM for elbow, wrist and digits of operated UE: Modified independent Sling wearing schedule (on at all times/off for ADL's): Modified independent Proper positioning of operated UE when showering: Modified independent Positioning of UE while sleeping: Moderate assistance;Patient able to independently direct caregiver    Home Living Family/patient expects to be discharged to:: Private residence Living Arrangements: Other relatives (sister and family) Available Help at Discharge: Family;Available 24 hours/day Type of Home: House Home Access: Level entry     Home Layout: Two level Alternate Level Stairs-Number of Steps: flight Alternate Level Stairs-Rails: Right;Left;Can reach both Bathroom Shower/Tub: Tub/shower unit Shower/tub characteristics: Engineer, building servicesCurtain Bathroom Toilet: Standard Bathroom Accessibility: Yes   Home Equipment: None          Prior Functioning/Environment Level of Independence: Independent        Comments: owner of catering business, driving, completely independent        OT Problem List: Impaired UE functional use;Pain;Obesity   OT Treatment/Interventions:      OT Goals(Current goals can be found in the care plan section) Acute Rehab OT Goals Patient Stated Goal: to get home OT Goal Formulation: With patient Time For Goal Achievement: 09/10/16 Potential to Achieve Goals: Good  OT Frequency:     Barriers to D/C:            Co-evaluation              End of Session  Equipment Utilized During Treatment: Other (comment) (sling) Nurse Communication: Mobility status;Weight bearing status;Other (comment) (pain level at 9/10, ice applied)  Activity Tolerance: Patient tolerated treatment well Patient left: in chair;with call bell/phone within reach   Time: 0902-0930 OT Time Calculation (min): 28 min Charges:  OT General Charges $OT Visit: 1 Procedure OT Evaluation $OT Eval Moderate Complexity: 1 Procedure OT Treatments $Self Care/Home Management : 8-22 mins G-Codes: OT G-codes **NOT FOR INPATIENT CLASS** Functional Assessment Tool Used: Clinical Judgement Functional Limitation: Self care Self Care Current Status (Z6109(G8987): At least 40 percent but less than 60 percent impaired, limited or restricted Self Care Goal Status (U0454(G8988): 0 percent impaired, limited or restricted Self Care Discharge Status [phone](G8989): At least 40 percent but less than 60 percent impaired, limited or restricted  Emelda FearLaura J Garyn Arlotta 09/03/2016, 10:00 AM  Sherryl MangesLaura [place] Arch OTR/L [phone]

## 2016-09-03 NOTE — Progress Notes (Signed)
PT Cancellation Note  Patient Details Name: Shaun KittenRobert Franks MRN: [id number] DOB: 1965-10-19   Cancelled Treatment:    Reason Eval/Treat Not Completed: PT screened, no needs identified, will sign off (Spoke with OT. No PT needs at this time. Thanks. )   Amadeo GarnetDawn F Kayan Blissett 09/03/2016, 9:44 AM [name],PT Acute Rehabilitation [phone] (pager)

## 2016-09-03 NOTE — Progress Notes (Signed)
Had patient watch Blood Glucose Monitoring, and How to Use an Insulin Pen educational video's.  Answered patient's questions about checking his blood sugars and giving himself insulin.  Made sure he understood to check sugars before each meal and to follow the sliding scale as to how much insulin to give.  Reviewed match program facilities with patient.  [place] [facility] [place] and [place].  Reviewed additional discharge instructions/medications with patient.  Patient is ready for discharge.  Patient is waiting on his ride to show up.

## 2016-09-03 NOTE — Care Management Note (Signed)
Case Management Note  Patient Details  Name: [name] MRN: [id number] Date of Birth: 1966/02/19  Subjective/Objective:                  s/p ORIF RIGHT  PROXIMAL HUMERUS FRACTURE (Right) ORIF RIGHT  HUMERAL SHAFT FRACTURE  Action/Plan: Discharge planning Expected Discharge Date:  09/02/16               Expected Discharge Plan:  Home/Self Care  In-House Referral:     Discharge planning Services  CM Consult, [place] Program, Medication Assistance  Post Acute Care Choice:  NA Choice offered to:  Patient  DME Arranged:  N/A DME Agency:  NA  HH Arranged:  NA HH Agency:  NA  Status of Service:  Completed, signed off  If discussed at [place] of Stay Meetings, dates discussed:    Additional Comments: Cm met with pt and gave pt [place] letter with list of participating pharmacies.  Pt verbalized understanding of all MATCH parameters.  Cm also gave pt [employer] [place] resource to call and set up appt to secure insurance thru [place].  No other CM needs were communicated. Dellie Catholic, RN 09/03/2016, 10:48 AM

## 2016-09-03 NOTE — Progress Notes (Signed)
     Subjective:  Patient reports pain as mild.  Patient in bed and in no acute distress.  Denies any CP or SOB  Objective:   VITALS:   Vitals:   09/02/16 1830 09/02/16 2010 09/03/16 0007 09/03/16 0455  BP: 126/68 (!) 145/74 125/72 133/75  Pulse: 90 94 93 90  Resp: 16 16 16 16   Temp: 99.5 F (37.5 C) 99.5 F (37.5 C) 98.2 F (36.8 C) 98.8 F (37.1 C)  TempSrc: Oral Oral Oral Oral  SpO2: 99% 96% 93% 97%    ABD soft Sensation intact distally Dorsiflexion/Plantar flexion intact Incision: dressing C/D/I and scant drainage Good wrist and hand motion  Lab Results  Component Value Date   WBC 11.1 (H) 09/02/2016   HGB 13.9 09/02/2016   HCT 42.7 09/02/2016   MCV 85.9 09/02/2016   PLT 240 09/02/2016   BMET    Component Value Date/Time   NA 134 (L) 09/03/2016 0829   K 3.8 09/03/2016 0829   CL 100 (L) 09/03/2016 0829   CO2 24 09/03/2016 0829   GLUCOSE 277 (H) 09/03/2016 0829   BUN 11 09/03/2016 0829   CREATININE 0.68 09/03/2016 0829   CALCIUM 8.9 09/03/2016 0829   GFRNONAA >60 09/03/2016 0829   GFRAA >60 09/03/2016 0829     Assessment/Plan: 1 Day Post-Op   Active Problems:   Right supracondylar humerus fracture   Diabetes (HCC)   Essential hypertension, benign   Advance diet Up with therapy Plan for DC home patient was cleared by medicine Medicines prescribed by medicine Pain meds by Dr Carola FrostHandy in the chart Dry dressing PRN Follow up with Dr Corrie MckusickHandy   DOUGLAS Kristalynn Coddington, Apolinar JunesBRANDON 09/03/2016, 11:59 AM   Teryl LucyJoshua Landau, MD Cell [phone]   Patient ID: Delman Kittenobert Hinnant, male   DOB: Jul 19, 1966, 51 y.o.   MRN: [id number]

## 2016-09-03 NOTE — Progress Notes (Addendum)
Inpatient Diabetes Program Recommendations  AACE/ADA: New Consensus Statement on Inpatient Glycemic Control (2015)  Target Ranges:  Prepandial:   less than 140 mg/dL      Peak postprandial:   less than 180 mg/dL (1-2 hours)      Critically ill patients:  140 - 180 mg/dL   Lab Results  Component Value Date   GLUCAP 251 (H) 09/03/2016   HGBA1C 11.7 (H) 09/02/2016    Review of Glycemic Control Results for [name], Dominque (MRN [id number]) as of 09/03/2016 08:56  Ref. Range 09/02/2016 11:47 09/02/2016 15:17 09/02/2016 18:23 09/02/2016 21:11 09/03/2016 06:12  Glucose-Capillary Latest Ref Range: 65 - 99 mg/dL 952312 (H) 841318 (H) 324337 (H) 281 (H) 251 (H)   Inpatient Diabetes Program Recommendations:   Please consider increase in Lantus to 30 units daily (approx .2 units x 158.8 kg). Will follow.  Thank you, Billy FischerJudy E. Macalister Arnaud, RN, MSN, CDE Inpatient Glycemic Control Team Team Pager [phone] (8am-5pm) 09/03/2016 8:58 AM

## 2016-09-05 ENCOUNTER — Encounter (HOSPITAL_COMMUNITY): Payer: Self-pay | Admitting: Orthopedic Surgery

## 2016-09-05 MED FILL — NovoLOG 100 UNIT/ML SOLN: 100 | 30 days supply | Qty: 10 | Fill #0

## 2016-09-05 MED FILL — TRUEplus LANCETS 30G MISC: 30 days supply | Qty: 100 | Fill #0

## 2016-09-05 MED FILL — ULTICARE INS 0.3 ML 30GX1/2: 30G X 1/2" | 30 days supply | Qty: 100 | Fill #0

## 2016-09-05 MED FILL — TRUE METRIX GLUCOSE TEST ST: 30 days supply | Qty: 100 | Fill #0

## 2016-09-23 ENCOUNTER — Ambulatory Visit: Payer: Self-pay | Attending: Family Medicine | Admitting: Family Medicine

## 2016-09-23 ENCOUNTER — Encounter: Payer: Self-pay | Admitting: Family Medicine

## 2016-09-23 VITALS — BP 125/80 | HR 98 | Temp 98.5°F | Resp 18 | Ht 72.0 in | Wt 291.8 lb

## 2016-09-23 DIAGNOSIS — Z8781 Personal history of (healed) traumatic fracture: Secondary | ICD-10-CM

## 2016-09-23 DIAGNOSIS — I10 Essential (primary) hypertension: Secondary | ICD-10-CM

## 2016-09-23 DIAGNOSIS — E1140 Type 2 diabetes mellitus with diabetic neuropathy, unspecified: Secondary | ICD-10-CM

## 2016-09-23 DIAGNOSIS — E119 Type 2 diabetes mellitus without complications: Secondary | ICD-10-CM | POA: Insufficient documentation

## 2016-09-23 DIAGNOSIS — Z794 Long term (current) use of insulin: Secondary | ICD-10-CM | POA: Insufficient documentation

## 2016-09-23 DIAGNOSIS — T1490XD Injury, unspecified, subsequent encounter: Secondary | ICD-10-CM

## 2016-09-23 DIAGNOSIS — E118 Type 2 diabetes mellitus with unspecified complications: Secondary | ICD-10-CM

## 2016-09-23 LAB — GLUCOSE, POCT (MANUAL RESULT ENTRY): POC Glucose: 159 mg/dl — AB (ref 70–99)

## 2016-09-23 MED ORDER — BACITRACIN 500 UNIT/GM EX OINT: 1.0000 "application " | TOPICAL_OINTMENT | Freq: Two times a day (BID) | CUTANEOUS | 0 refills | Status: DC

## 2016-09-23 MED ORDER — INSULIN GLARGINE 100 UNIT/ML SOLOSTAR PEN: 26.0000 [IU] | PEN_INJECTOR | Freq: Every day | SUBCUTANEOUS | 11 refills | Status: DC

## 2016-09-23 MED ORDER — IBUPROFEN 800 MG PO TABS: 800.0000 mg | ORAL_TABLET | Freq: Three times a day (TID) | ORAL | 0 refills | Status: DC | PRN

## 2016-09-23 MED ORDER — METFORMIN HCL 1000 MG PO TABS: 1000.0000 mg | ORAL_TABLET | Freq: Two times a day (BID) | ORAL | 2 refills | Status: DC

## 2016-09-23 MED ORDER — LISINOPRIL 10 MG PO TABS: 10.0000 mg | ORAL_TABLET | Freq: Every day | ORAL | 2 refills | Status: DC

## 2016-09-23 MED ORDER — GABAPENTIN 300 MG PO CAPS: 300.0000 mg | ORAL_CAPSULE | Freq: Every day | ORAL | 2 refills | Status: DC

## 2016-09-23 MED ORDER — INSULIN ASPART 100 UNIT/ML FLEXPEN: PEN_INJECTOR | SUBCUTANEOUS | 11 refills | Status: DC

## 2016-09-23 MED FILL — !LANTUS 100 UNITS/ML VIAL: 100 | 38 days supply | Qty: 10 | Fill #0

## 2016-09-23 MED FILL — GABAPENTIN 300 MG CAPSULE: 300 | 30 days supply | Qty: 30 | Fill #0

## 2016-09-23 MED FILL — LISINOPRIL 10 MG TABLET: 10 | 30 days supply | Qty: 30 | Fill #0

## 2016-09-23 MED FILL — IBUPROFEN 800 MG TABLET: 800 | 10 days supply | Qty: 30 | Fill #0

## 2016-09-23 MED FILL — metFORMIN HCL 1000 MG TABS: 1000 | 30 days supply | Qty: 60 | Fill #0

## 2016-09-23 NOTE — Progress Notes (Signed)
Patient is here for establish care  Patient fracture his humerus due to a fall on 08/20/2016  Patient complains pain  Pain level a 5-6  Patient has eaten today Patient has taking her meds today

## 2016-09-23 NOTE — Progress Notes (Signed)
Subjective:  Patient ID: [name], male    DOB: [birth year]  Age: 51 y.o. MRN: [id number]  CC: Establish Care   HPI [name] presents to establish care for diabetes. He reports checking his blood glucose at home. He reports CBGs at home range between 120s to 160s. Reports CBGs increase to 160s only after meals.Recent history of right arm fracture. Reports follow-up with orthopedic physician next week. Reports recently hitting his toe. He denies any signs of infection, temperature or color change to the extremity.   Outpatient Medications Prior to Visit  Medication Sig Dispense Refill  . glucose blood test strip Use as instructed 100 each 0  . glucose monitoring kit (FREESTYLE) monitoring kit 1 each by Does not apply route 4 (four) times daily - after meals and at bedtime. 1 month Diabetic Testing Supplies for QAC-QHS accuchecks. 1 each 1  . Insulin Pen Needle 32G X 8 MM MISC Use as directed 100 each 0  . Lancets (FREESTYLE) lancets Use as instructed 100 each 0  . lisinopril (PRINIVIL,ZESTRIL) 10 MG tablet Take 1 tablet (10 mg total) by mouth daily. 30 tablet 0  . oxyCODONE (ROXICODONE) 5 MG immediate release tablet Take 1-2 tablets (5-10 mg total) by mouth every 6 (six) hours as needed for breakthrough pain (take between percocet for breakthrough pain only). 50 tablet 0  . oxyCODONE-acetaminophen (PERCOCET) 10-325 MG tablet Take 1 tablet by mouth every 6 (six) hours as needed for pain. 60 tablet 0  . promethazine (PHENERGAN) 12.5 MG tablet Take 1-2 tablets (12.5-25 mg total) by mouth every 6 (six) hours as needed for nausea or vomiting. 30 tablet 0  . insulin aspart (NOVOLOG FLEXPEN) 100 UNIT/ML FlexPen 0-15 Units, Subcutaneous, 3 times daily with meals CBG < 70: implement hypoglycemia protocol-call MD CBG 70 - 120: 0 units CBG 121 - 150: 2 units CBG 151 - 200: 3 units CBG 201 - 250: 5 units CBG 251 - 300: 8 units CBG 301 - 350: 11 units CBG 351 - 400: 15 units CBG > 400: 15  mL 0  . Insulin Glargine (LANTUS) 100 UNIT/ML Solostar Pen Inject 26 Units into the skin daily at 10 pm. 15 mL 0  . metFORMIN (GLUCOPHAGE) 500 MG tablet Take 1 tablet (500 mg total) by mouth 2 (two) times daily with a meal. 60 tablet 0   No facility-administered medications prior to visit.     ROS Review of Systems  Eyes: Negative.   Respiratory: Negative.   Cardiovascular: Negative.   Gastrointestinal: Negative.   Musculoskeletal:       Recent history of right arm fracture.     Objective:  BP 125/80 (BP Location: Left Arm, Patient Position: Sitting, Cuff Size: Normal)   Pulse 98   Temp 98.5 F (36.9 C) (Oral)   Resp 18   Ht 6' (1.829 m)   Wt 291 lb 12.8 oz (132.4 kg)   SpO2 95%   BMI 39.58 kg/m   BP/Weight 09/23/2016 09/03/2016 01/25/3558  Systolic BP 741 638 453  Diastolic BP 80 77 75  Wt. (Lbs) 291.8 - 350  BMI 39.58 - 47.47   Diabetic Foot Exam - Simple   Simple Foot Form Diabetic Foot exam was performed with the following findings:  Yes 09/23/2016 12:21 PM  Visual Inspection See comments:  Yes Sensation Testing See comments:  Yes Pulse Check Posterior Tibialis and Dorsalis pulse intact bilaterally:  Yes Comments 5th digit of right toe has healing laceration at the lateral nail bed.  No purulent drainage, but dry blood present. No signs of infection. Decreased sensation to bilateral feet near the metatarsal areas.     Physical Exam  Constitutional: He is oriented to person, place, and time.  Eyes: Conjunctivae are normal. Pupils are equal, round, and reactive to light.  Cardiovascular: Normal rate, regular rhythm, normal heart sounds and intact distal pulses.   Pulmonary/Chest: Effort normal and breath sounds normal.  Abdominal: Soft. Bowel sounds are normal.  Musculoskeletal:  Right arm in supportive sling. ROM is  limited.  Neurological: He is alert and oriented to person, place, and time.  Skin: Skin is warm and dry. Laceration (5th digit of right toe has  healing laceration at the lateral nail bed. No purulent drainage, but dry blood present. No signs of infection. ) noted.  Nursing note and vitals reviewed.    Assessment & Plan:   Problem List Items Addressed This Visit      Endocrine   Diabetes ([place]) - Primary   Relevant Medications   metFORMIN (GLUCOPHAGE) 1000 MG tablet   lisinopril (PRINIVIL,ZESTRIL) 10 MG tablet   gabapentin (NEURONTIN) 300 MG capsule   Insulin Glargine (LANTUS) 100 UNIT/ML Solostar Pen   insulin aspart (NOVOLOG FLEXPEN) 100 UNIT/ML FlexPen   Other Relevant Orders   Ambulatory referral to Podiatry   Glucose (CBG) (Completed)   Ambulatory referral to Ophthalmology    Other Visit Diagnoses    Healing wound       Relevant Medications   bacitracin 500 UNIT/GM ointment   Other Relevant Orders   Ambulatory referral to Podiatry   Essential hypertension       Relevant Medications   lisinopril (PRINIVIL,ZESTRIL) 10 MG tablet   History of arm fracture       Relevant Medications   ibuprofen (ADVIL,MOTRIN) 800 MG tablet      Meds ordered this encounter  Medications  . metFORMIN (GLUCOPHAGE) 1000 MG tablet    Sig: Take 1 tablet (1,000 mg total) by mouth 2 (two) times daily with a meal.    Dispense:  60 tablet    Refill:  2    Order Specific Question:   Supervising Provider    Answer:   Tresa Garter [id number]  . lisinopril (PRINIVIL,ZESTRIL) 10 MG tablet    Sig: Take 1 tablet (10 mg total) by mouth daily.    Dispense:  30 tablet    Refill:  2    Order Specific Question:   Supervising Provider    Answer:   Tresa Garter [id number]  . bacitracin 500 UNIT/GM ointment    Sig: Apply 1 application topically 2 (two) times daily. Apply to affected areas for 5 days.    Dispense:  15 g    Refill:  0    Order Specific Question:   Supervising Provider    Answer:   Tresa Garter [id number]  . gabapentin (NEURONTIN) 300 MG capsule    Sig: Take 1 capsule (300 mg total) by mouth at bedtime.     Dispense:  90 capsule    Refill:  2    Order Specific Question:   Supervising Provider    Answer:   Tresa Garter [id number]  . Insulin Glargine (LANTUS) 100 UNIT/ML Solostar Pen    Sig: Inject 26 Units into the skin daily at 10 pm.    Dispense:  15 mL    Refill:  11    Order Specific Question:   Supervising Provider    Answer:   Angelica Chessman  E [[id number]]  . insulin aspart (NOVOLOG FLEXPEN) 100 UNIT/ML FlexPen    Sig: 0-15 Units, Subcutaneous, 3 times daily with meals CBG < 70: implement hypoglycemia protocol-call MD CBG 70 - 120: 0 units CBG 121 - 150: 2 units CBG 151 - 200: 3 units CBG 201 - 250: 5 units CBG 251 - 300: 8 units CBG 301 - 350: 11 units CBG 351 - 400: 15 units CBG > 400:    Dispense:  15 mL    Refill:  11    Order Specific Question:   Supervising Provider    Answer:   Tresa Garter [id number]  . ibuprofen (ADVIL,MOTRIN) 800 MG tablet    Sig: Take 1 tablet (800 mg total) by mouth every 8 (eight) hours as needed.    Dispense:  30 tablet    Refill:  0    Order Specific Question:   Supervising Provider    Answer:   Tresa Garter [[id number]]    Follow-up: Return in about 2 weeks (around 10/07/2016) for Diabetes with Stacy & Follow up with PCP.Alfonse Spruce FNP

## 2016-09-23 NOTE — Patient Instructions (Addendum)
Come back in 2 weeks for Diabetes follow up with Clinical Pharmacist & Follow up with PCP.  Diabetes and Foot Care  Diabetes may cause you to have problems because of poor blood supply (circulation) to your feet and legs. This may cause the skin on your feet to become thinner, break easier, and heal more slowly. Your skin may become dry, and the skin may peel and crack. You may also have nerve damage in your legs and feet causing decreased feeling in them. You may not notice minor injuries to your feet that could lead to infections or more serious problems. Taking care of your feet is one of the most important things you can do for yourself. Follow these instructions at home:  Wear shoes at all times, even in the house. Do not go barefoot. Bare feet are easily injured.  Check your feet daily for blisters, cuts, and redness. If you cannot see the bottom of your feet, use a mirror or ask someone for help.  Wash your feet with warm water (do not use hot water) and mild soap. Then pat your feet and the areas between your toes until they are completely dry. Do not soak your feet as this can dry your skin.  Apply a moisturizing lotion or petroleum jelly (that does not contain alcohol and is unscented) to the skin on your feet and to dry, brittle toenails. Do not apply lotion between your toes.  Trim your toenails straight across. Do not dig under them or around the cuticle. File the edges of your nails with an emery board or nail file.  Do not cut corns or calluses or try to remove them with medicine.  Wear clean socks or stockings every day. Make sure they are not too tight. Do not wear knee-high stockings since they may decrease blood flow to your legs.  Wear shoes that fit properly and have enough cushioning. To break in new shoes, wear them for just a few hours a day. This prevents you from injuring your feet. Always look in your shoes before you put them on to be sure there are no objects  inside.  Do not cross your legs. This may decrease the blood flow to your feet.  If you find a minor scrape, cut, or break in the skin on your feet, keep it and the skin around it clean and dry. These areas may be cleansed with mild soap and water. Do not cleanse the area with peroxide, alcohol, or iodine.  When you remove an adhesive bandage, be sure not to damage the skin around it.  If you have a wound, look at it several times a day to make sure it is healing.  Do not use heating pads or hot water bottles. They may burn your skin. If you have lost feeling in your feet or legs, you may not know it is happening until it is too late.  Make sure your health care provider performs a complete foot exam at least annually or more often if you have foot problems. Report any cuts, sores, or bruises to your health care provider immediately. Contact a health care provider if:  You have an injury that is not healing.  You have cuts or breaks in the skin.  You have an ingrown nail.  You notice redness on your legs or feet.  You feel burning or tingling in your legs or feet.  You have pain or cramps in your legs and feet.  Your legs  or feet are numb.  Your feet always feel cold. Get help right away if:  There is increasing redness, swelling, or pain in or around a wound.  There is a red line that goes up your leg.  Pus is coming from a wound.  You develop a fever or as directed by your health care provider.  You notice a bad smell coming from an ulcer or wound. This information is not intended to replace advice given to you by your health care provider. Make sure you discuss any questions you have with your health care provider. Document Released: 07/22/2000 Document Revised: 12/31/2015 Document Reviewed: 01/01/2013 Elsevier Interactive Patient Education  2017 [place].  Metformin tablets What is this medicine? METFORMIN (met FOR min) is used to treat type 2 diabetes. It  helps to control blood sugar. Treatment is combined with diet and exercise. This medicine can be used alone or with other medicines for diabetes. This medicine may be used for other purposes; ask your health care provider or pharmacist if you have questions. COMMON BRAND NAME(S): Glucophage What should I tell my health care provider before I take this medicine? They need to know if you have any of these conditions: -anemia -frequently drink alcohol-containing beverages -become easily dehydrated -heart attack -heart failure that is treated with medications -kidney disease -liver disease -polycystic ovary syndrome -serious infection or injury -vomiting -an unusual or allergic reaction to metformin, other medicines, foods, dyes, or preservatives -pregnant or trying to get pregnant -breast-feeding How should I use this medicine? Take this medicine by mouth. Take it with meals. Swallow the tablets with a drink of water. Follow the directions on the prescription label. Take your medicine at regular intervals. Do not take your medicine more often than directed. Talk to your pediatrician regarding the use of this medicine in children. While this drug may be prescribed for children as young as 63 years of age for selected conditions, precautions do apply. Overdosage: If you think you have taken too much of this medicine contact a poison control center or emergency room at once. NOTE: This medicine is only for you. Do not share this medicine with others. What if I miss a dose? If you miss a dose, take it as soon as you can. If it is almost time for your next dose, take only that dose. Do not take double or extra doses. What may interact with this medicine? Do not take this medicine with any of the following medications: -dofetilide -gatifloxacin -certain contrast medicines given before X-rays, CT scans, MRI, or other procedures This medicine may also interact with the following  medications: -acetazolamide -certain medicines for HIV infection or hepatitis, like adefovir, emtricitabine, entecavir, lamivudine, or tenofovir -cimetidine -crizotinib -digoxin -diuretics -male hormones, like estrogens or progestins and birth control pills -glycopyrrolate -isoniazid -lamotrigine -medicines for blood pressure, heart disease, irregular heart beat -memantine -midodrine -methazolamide -morphine -nicotinic acid -phenothiazines like chlorpromazine, mesoridazine, prochlorperazine, thioridazine -phenytoin -procainamide -propantheline -quinidine -quinine -ranitidine -ranolazine -steroid medicines like prednisone or cortisone -stimulant medicines for attention disorders, weight loss, or to stay awake -thyroid medicines -topiramate -trimethoprim -trospium -vancomycin -vandetanib -zonisamide This list may not describe all possible interactions. Give your health care provider a list of all the medicines, herbs, non-prescription drugs, or dietary supplements you use. Also tell them if you smoke, drink alcohol, or use illegal drugs. Some items may interact with your medicine. What should I watch for while using this medicine? Visit your doctor or health care professional for regular checks  on your progress. A test called the HbA1C (A1C) will be monitored. This is a simple blood test. It measures your blood sugar control over the last 2 to 3 months. You will receive this test every 3 to 6 months. Learn how to check your blood sugar. Learn the symptoms of low and high blood sugar and how to manage them. Always carry a quick-source of sugar with you in case you have symptoms of low blood sugar. Examples include hard sugar candy or glucose tablets. Make sure others know that you can choke if you eat or drink when you develop serious symptoms of low blood sugar, such as seizures or unconsciousness. They must get medical help at once. Tell your doctor or health care  professional if you have high blood sugar. You might need to change the dose of your medicine. If you are sick or exercising more than usual, you might need to change the dose of your medicine. Do not skip meals. Ask your doctor or health care professional if you should avoid alcohol. Many nonprescription cough and cold products contain sugar or alcohol. These can affect blood sugar. This medicine may cause ovulation in premenopausal women who do not have regular monthly periods. This may increase your chances of becoming pregnant. You should not take this medicine if you become pregnant or think you may be pregnant. Talk with your doctor or health care professional about your birth control options while taking this medicine. Contact your doctor or health care professional right away if think you are pregnant. If you are going to need surgery, a MRI, CT scan, or other procedure, tell your doctor that you are taking this medicine. You may need to stop taking this medicine before the procedure. Wear a medical ID bracelet or chain, and carry a card that describes your disease and details of your medicine and dosage times. What side effects may I notice from receiving this medicine? Side effects that you should report to your doctor or health care professional as soon as possible: -allergic reactions like skin rash, itching or hives, swelling of the face, lips, or tongue -breathing problems -feeling faint or lightheaded, falls -muscle aches or pains -signs and symptoms of low blood sugar such as feeling anxious, confusion, dizziness, increased hunger, unusually weak or tired, sweating, shakiness, cold, irritable, headache, blurred vision, fast heartbeat, loss of consciousness -slow or irregular heartbeat -unusual stomach pain or discomfort -unusually tired or weak Side effects that usually do not require medical attention (report to your doctor or health care professional if they continue or are  bothersome): -diarrhea -headache -heartburn -metallic taste in mouth -nausea -stomach gas, upset This list may not describe all possible side effects. Call your doctor for medical advice about side effects. You may report side effects to FDA at 1-800-FDA-1088. Where should I keep my medicine? Keep out of the reach of children. Store at room temperature between 15 and 30 degrees C (59 and 86 degrees F). Protect from moisture and light. Throw away any unused medicine after the expiration date. NOTE: This sheet is a summary. It may not cover all possible information. If you have questions about this medicine, talk to your doctor, pharmacist, or health care provider.  2017 Elsevier/Gold Standard (2014-01-07 22:14:40)

## 2016-10-04 ENCOUNTER — Other Ambulatory Visit: Payer: Self-pay | Admitting: Family Medicine

## 2016-10-04 ENCOUNTER — Telehealth: Payer: Self-pay | Admitting: Family Medicine

## 2016-10-04 DIAGNOSIS — Z794 Long term (current) use of insulin: Principal | ICD-10-CM

## 2016-10-04 DIAGNOSIS — E118 Type 2 diabetes mellitus with unspecified complications: Secondary | ICD-10-CM

## 2016-10-04 MED ORDER — GLUCOSE BLOOD VI STRP: ORAL_STRIP | 12 refills | Status: DC

## 2016-10-04 MED ORDER — GLUCOSE BLOOD VI STRP: ORAL_STRIP | 11 refills | Status: DC

## 2016-10-04 NOTE — Telephone Encounter (Signed)
Refill sent to pharmacy.   

## 2016-10-04 NOTE — Telephone Encounter (Signed)
Patient called the office to request medication refill glucose blood test strip. Please send them to our pharmacy.    Thank you.

## 2016-10-05 MED FILL — TRUE METRIX TEST STRIP: 25 days supply | Qty: 100 | Fill #0

## 2016-10-06 ENCOUNTER — Ambulatory Visit (HOSPITAL_BASED_OUTPATIENT_CLINIC_OR_DEPARTMENT_OTHER): Payer: Self-pay | Admitting: Pharmacist

## 2016-10-06 ENCOUNTER — Ambulatory Visit: Payer: Self-pay | Attending: Family Medicine | Admitting: Family Medicine

## 2016-10-06 ENCOUNTER — Encounter: Payer: Self-pay | Admitting: Family Medicine

## 2016-10-06 ENCOUNTER — Ambulatory Visit: Payer: Self-pay

## 2016-10-06 VITALS — BP 138/87 | HR 96 | Temp 98.6°F | Resp 18 | Ht 72.0 in | Wt 301.6 lb

## 2016-10-06 DIAGNOSIS — Z794 Long term (current) use of insulin: Secondary | ICD-10-CM | POA: Insufficient documentation

## 2016-10-06 DIAGNOSIS — G8929 Other chronic pain: Secondary | ICD-10-CM | POA: Insufficient documentation

## 2016-10-06 DIAGNOSIS — Z8781 Personal history of (healed) traumatic fracture: Secondary | ICD-10-CM

## 2016-10-06 DIAGNOSIS — M79601 Pain in right arm: Secondary | ICD-10-CM | POA: Insufficient documentation

## 2016-10-06 DIAGNOSIS — E118 Type 2 diabetes mellitus with unspecified complications: Secondary | ICD-10-CM

## 2016-10-06 DIAGNOSIS — E119 Type 2 diabetes mellitus without complications: Secondary | ICD-10-CM | POA: Insufficient documentation

## 2016-10-06 DIAGNOSIS — Z79899 Other long term (current) drug therapy: Secondary | ICD-10-CM | POA: Insufficient documentation

## 2016-10-06 LAB — GLUCOSE, POCT (MANUAL RESULT ENTRY): POC Glucose: 189 mg/dl — AB (ref 70–99)

## 2016-10-06 MED ORDER — INSULIN GLARGINE 100 UNIT/ML SOLOSTAR PEN: 30.0000 [IU] | PEN_INJECTOR | Freq: Every day | SUBCUTANEOUS | 11 refills | Status: DC

## 2016-10-06 MED ORDER — INSULIN SYRINGES (DISPOSABLE) U-100 1 ML MISC: 1.0000 | Freq: Once | 11 refills | Status: AC

## 2016-10-06 MED ORDER — INSULIN PEN NEEDLE 32G X 8 MM MISC: 1.0000 | Freq: Once | 11 refills | Status: AC

## 2016-10-06 MED ORDER — GLIPIZIDE 5 MG PO TABS: 2.5000 mg | ORAL_TABLET | Freq: Every day | ORAL | 2 refills | Status: DC

## 2016-10-06 MED ORDER — METFORMIN HCL ER 500 MG PO TB24: 1000.0000 mg | ORAL_TABLET | Freq: Two times a day (BID) | ORAL | 3 refills | Status: DC

## 2016-10-06 MED FILL — TRUEPLUS SYR 1ML 30GX5/16: 30G X 5/16" | 25 days supply | Qty: 100 | Fill #0

## 2016-10-06 MED FILL — !LANTUS SOLOSTAR 100UNITS/M: 100 | 19 days supply | Qty: 6 | Fill #0

## 2016-10-06 MED FILL — TRUEPLUS PEN NDL 31G X 1/4: 31G X 6 MM | 25 days supply | Qty: 100 | Fill #0

## 2016-10-06 MED FILL — TRUEPLUS SYR 1ML 30GX5/16": 30G X 5/16" | 25 days supply | Qty: 100 | Fill #0

## 2016-10-06 MED FILL — METFORMIN HCL ER 500 MG TAB: 500 | 30 days supply | Qty: 120 | Fill #0

## 2016-10-06 MED FILL — TRUEPLUS PEN NDL 31G X 1/4": 31G X 6 MM | 25 days supply | Qty: 100 | Fill #0

## 2016-10-06 NOTE — Progress Notes (Signed)
Subjective:  Patient ID: [name], male    DOB: 05-05-1966  Age: 51 y.o. MRN: [id number]  CC: Establish Care   HPI [name] presents for   History of arm fracture: January 16 th 4 weeks after surgery. Reports history of orthopedics two weeks ago. Request narcotic pain medication for pain. Pt. informed that CSRS was checked and that he was recently prescribed and supplied narcotic pain medication and that additional narcotic pain medication would not be prescribed. Pt.offered additional non-narcotic analgesics for pain. He declines.  DM: Reports adherence with taking medications and blood sugars at home. Reports ranges around 140- 200's. Request additional diabetic insulin supplies. Denies any N/V, blurred vision, wounds, or paresthesias.    Outpatient Medications Prior to Visit  Medication Sig Dispense Refill  . gabapentin (NEURONTIN) 300 MG capsule Take 1 capsule (300 mg total) by mouth at bedtime. 90 capsule 2  . glucose blood test strip Use as instructed 100 each 12  . glucose monitoring kit (FREESTYLE) monitoring kit 1 each by Does not apply route 4 (four) times daily - after meals and at bedtime. 1 month Diabetic Testing Supplies for QAC-QHS accuchecks. 1 each 1  . Insulin Pen Needle 32G X 8 MM MISC Use as directed 100 each 0  . Lancets (FREESTYLE) lancets Use as instructed 100 each 0  . lisinopril (PRINIVIL,ZESTRIL) 10 MG tablet Take 1 tablet (10 mg total) by mouth daily. 30 tablet 2  . oxyCODONE (ROXICODONE) 5 MG immediate release tablet Take 1-2 tablets (5-10 mg total) by mouth every 6 (six) hours as needed for breakthrough pain (take between percocet for breakthrough pain only). 50 tablet 0  . insulin aspart (NOVOLOG FLEXPEN) 100 UNIT/ML FlexPen 0-15 Units, Subcutaneous, 3 times daily with meals CBG < 70: implement hypoglycemia protocol-call MD CBG 70 - 120: 0 units CBG 121 - 150: 2 units CBG 151 - 200: 3 units CBG 201 - 250: 5 units CBG 251 - 300: 8 units CBG 301 -  350: 11 units CBG 351 - 400: 15 units CBG > 400: 15 mL 11  . Insulin Glargine (LANTUS) 100 UNIT/ML Solostar Pen Inject 26 Units into the skin daily at 10 pm. 15 mL 11  . metFORMIN (GLUCOPHAGE) 1000 MG tablet Take 1 tablet (1,000 mg total) by mouth 2 (two) times daily with a meal. 60 tablet 2  . bacitracin 500 UNIT/GM ointment Apply 1 application topically 2 (two) times daily. Apply to affected areas for 5 days. (Patient not taking: Reported on 10/06/2016) 15 g 0  . ibuprofen (ADVIL,MOTRIN) 800 MG tablet Take 1 tablet (800 mg total) by mouth every 8 (eight) hours as needed. (Patient not taking: Reported on 10/06/2016) 30 tablet 0  . lisinopril (PRINIVIL,ZESTRIL) 10 MG tablet Take 1 tablet (10 mg total) by mouth daily. 30 tablet 0  . oxyCODONE-acetaminophen (PERCOCET) 10-325 MG tablet Take 1 tablet by mouth every 6 (six) hours as needed for pain. 60 tablet 0  . promethazine (PHENERGAN) 12.5 MG tablet Take 1-2 tablets (12.5-25 mg total) by mouth every 6 (six) hours as needed for nausea or vomiting. 30 tablet 0   No facility-administered medications prior to visit.     ROS Review of Systems  Respiratory: Negative.   Cardiovascular: Negative.   Gastrointestinal: Negative.   Musculoskeletal: Positive for arthralgias and myalgias.       History of fracture  Skin: Negative.      Objective:  BP 138/87 (BP Location: Left Arm, Patient Position: Sitting, Cuff Size: Normal)  Pulse 96   Temp 98.6 F (37 C) (Oral)   Resp 18   Ht 6' (1.829 m)   Wt (!) 301 lb 9.6 oz (136.8 kg)   SpO2 95%   BMI 40.90 kg/m   BP/Weight 10/06/2016 09/23/2016 4/38/8875  Systolic BP 797 282 060  Diastolic BP 87 80 77  Wt. (Lbs) 301.6 291.8 -  BMI 40.9 39.58 -     Physical Exam  Cardiovascular: Normal rate, regular rhythm, normal heart sounds and intact distal pulses.   Pulmonary/Chest: Effort normal and breath sounds normal.  Abdominal: Soft. Bowel sounds are normal.  Musculoskeletal:  Fracture to right forearm,  right arm in sling.   Skin: Skin is warm and dry.  Nursing note and vitals reviewed.   Assessment & Plan:   Problem List Items Addressed This Visit      Endocrine   Diabetes ([place])   Relevant Orders   Glucose (CBG) (Completed)    Other Visit Diagnoses    Chronic pain of right upper extremity    -  Primary   History of arm fracture          Meds ordered this encounter  Medications  . DISCONTD: glipiZIDE (GLUCOTROL) 5 MG tablet    Sig: Take 0.5 tablets (2.5 mg total) by mouth daily before breakfast.    Dispense:  30 tablet    Refill:  2    Order Specific Question:   Supervising Provider    Answer:   Tresa Garter [id number]  . Insulin Pen Needle 32G X 8 MM MISC    Sig: 1 kit by Does not apply route once.    Dispense:  100 each    Refill:  11    Order Specific Question:   Supervising Provider    Answer:   Tresa Garter [id number]  . Insulin Syringes, Disposable, U-100 1 ML MISC    Sig: 1 kit by Does not apply route once.    Dispense:  100 each    Refill:  11    Order Specific Question:   Supervising Provider    Answer:   Tresa Garter [[id number]]    Follow-up: Return in about 2 weeks (around 10/20/2016) for Diabetes with St[facility].   Alfonse Spruce FNP

## 2016-10-06 NOTE — Patient Instructions (Addendum)
Thanks for coming to see me!  Don't pick up the glipizide for now. Let's see if we can get your fastings down with a little more Lantus  Switch to extended release metformin - you can take 1 tablet with meals to start but it really should help the stomach upset  You can do this!!!!  Come back and see me in two weeks

## 2016-10-06 NOTE — Progress Notes (Signed)
Patient is here for F/Up fracture of the humerus  Patient still complains pain a pain level from 5-6  Patient stated that his doctor that he goes to for his humerus told him to stop taking ibuprofen until the surgery  He just started to take his gabapentin 2 days ago  Patient has already taking his insulin this morning  Patient stated that his has eaten an apple

## 2016-10-06 NOTE — Patient Instructions (Signed)
Glipizide tablets What is this medicine? GLIPIZIDE (GLIP i zide) helps to treat type 2 diabetes. Treatment is combined with diet and exercise. The medicine helps your body to use insulin better. This medicine may be used for other purposes; ask your health care provider or pharmacist if you have questions. COMMON BRAND NAME(S): Glucotrol What should I tell my health care provider before I take this medicine? They need to know if you have any of these conditions: -diabetic ketoacidosis -glucose-6-phosphate dehydrogenase deficiency -heart disease -kidney disease -liver disease -porphyria -severe infection or injury -thyroid disease -an unusual or allergic reaction to glipizide, sulfa drugs, other medicines, foods, dyes, or preservatives -pregnant or trying to get pregnant -breast-feeding How should I use this medicine? Take this medicine by mouth. Swallow with a drink of water. Do not take with food. Take it 30 minutes before a meal. Follow the directions on the prescription label. If you take this medicine once a day, take it 30 minutes before breakfast. Take your doses at the same time each day. Do not take more often than directed. Talk to your pediatrician regarding the use of this medicine in children. Special care may be needed. Elderly patients over 65 years old may have a stronger reaction and need a smaller dose. Overdosage: If you think you have taken too much of this medicine contact a poison control center or emergency room at once. NOTE: This medicine is only for you. Do not share this medicine with others. What if I miss a dose? If you miss a dose, take it as soon as you can. If it is almost time for your next dose, take only that dose. Do not take double or extra doses. What may interact with this medicine? -bosentan -chloramphenicol -cisapride -clarithromycin -medicines for fungal or yeast infections -metoclopramide -probenecid -warfarin Many medications may cause an  increase or decrease in blood sugar, these include: -alcohol containing beverages -aspirin and aspirin-like drugs -chloramphenicol -chromium -diuretics -male hormones, like estrogens or progestins and birth control pills -heart medicines -isoniazid -male hormones or anabolic steroids -medicines for weight loss -medicines for allergies, asthma, cold, or cough -medicines for mental problems -medicines called MAO Inhibitors like Nardil, Parnate, Marplan, Eldepryl -niacin -NSAIDs, medicines for pain and inflammation, like ibuprofen or naproxen -pentamidine -phenytoin -probenecid -quinolone antibiotics like ciprofloxacin, levofloxacin, ofloxacin -some herbal dietary supplements -steroid medicines like prednisone or cortisone -thyroid medicine This list may not describe all possible interactions. Give your health care provider a list of all the medicines, herbs, non-prescription drugs, or dietary supplements you use. Also tell them if you smoke, drink alcohol, or use illegal drugs. Some items may interact with your medicine. What should I watch for while using this medicine? Visit your doctor or health care professional for regular checks on your progress. A test called the HbA1C (A1C) will be monitored. This is a simple blood test. It measures your blood sugar control over the last 2 to 3 months. You will receive this test every 3 to 6 months. Learn how to check your blood sugar. Learn the symptoms of low and high blood sugar and how to manage them. Always carry a quick-source of sugar with you in case you have symptoms of low blood sugar. Examples include hard sugar candy or glucose tablets. Make sure others know that you can choke if you eat or drink when you develop serious symptoms of low blood sugar, such as seizures or unconsciousness. They must get medical help at once. Tell your doctor or health   care professional if you have high blood sugar. You might need to change the dose of  your medicine. If you are sick or exercising more than usual, you might need to change the dose of your medicine. Do not skip meals. Ask your doctor or health care professional if you should avoid alcohol. Many nonprescription cough and cold products contain sugar or alcohol. These can affect blood sugar. This medicine can make you more sensitive to the sun. Keep out of the sun. If you cannot avoid being in the sun, wear protective clothing and use sunscreen. Do not use sun lamps or tanning beds/booths. Wear a medical ID bracelet or chain, and carry a card that describes your disease and details of your medicine and dosage times. What side effects may I notice from receiving this medicine? Side effects that you should report to your doctor or health care professional as soon as possible: -allergic reactions like skin rash, itching or hives, swelling of the face, lips, or tongue -breathing problems -dark urine -fever, chills, sore throat -signs and symptoms of low blood sugar such as feeling anxious, confusion, dizziness, increased hunger, unusually weak or tired, sweating, shakiness, cold, irritable, headache, blurred vision, fast heartbeat, loss of consciousness -unusual bleeding or bruising -yellowing of the eyes or skin Side effects that usually do not require medical attention (report to your doctor or health care professional if they continue or are bothersome): -diarrhea -dizziness -headache -heartburn -nausea -stomach gas This list may not describe all possible side effects. Call your doctor for medical advice about side effects. You may report side effects to FDA at 1-800-FDA-1088. Where should I keep my medicine? Keep out of the reach of children. Store at room temperature below 30 degrees C (86 degrees F). Throw away any unused medicine after the expiration date. NOTE: This sheet is a summary. It may not cover all possible information. If you have questions about this medicine, talk  to your doctor, pharmacist, or health care provider.  2018 Elsevier/Gold Standard (2012-11-07 14:42:46)  

## 2016-10-06 NOTE — Progress Notes (Signed)
    S:    Patient arrives in good spirits.  Presents for diabetes evaluation, education, and management at the request of Arrie SenateMandesia [name], NP and Dr. Hyman HopesJegede. Patient was referred on 09/23/16.  Patient was last seen by Primary Care Provider on 10/06/16.   Patient reports Diabetes was diagnosed about 7 years ago. He was started on metformin at that time and worked really hard on diet and exercise and was able to control his diabetes for multiple year. However, he eventually stopped these things.  Patient reports adherence with medications.  Current diabetes medications include: Lantus 26 units daily, Novolog sliding scale, metformin 1000 mg BID, and was just started on glipizide 5 mg daily. He only needs to use 2-3 units of the Novolog sliding scale.  Patient denies hypoglycemic events.  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  Patient reported exercise habits: knows he needs to start exercising.   Patient denies nocturia.  Patient denies neuropathy. Patient denies visual changes. Patient reports self foot exams.    O:  Lab Results  Component Value Date   HGBA1C 11.7 (H) 09/02/2016   There were no vitals filed for this visit.  Home fasting CBG: 140s-150s  2 hour post-prandial/random CBG: 107-140s  Meter averages: 30 day average: 164 14 day average: 154 7 day average: 150   A/P: Diabetes longstanding currently uncontrolled based on A1c of 11.7 but under better control based on home CBGs. Patient denies hypoglycemic events and is able to verbalize appropriate hypoglycemia management plan. Patient reports adherence with medication. Control is suboptimal due to dietary indiscretion and sedentary lifestyle.  Instructed patient to hold off on starting the glipizide - by taking it in the morning, he is at risk for hypoglycemia during the day since his post-prandial readings are so well  controlled. His fastings seem to be the problem, so increase Lantus to 30 units daily. He requires so little Novolog that I am hopeful that we can stop that over the next few weeks. Then his plan is to really work on diet and exercise to try to come down on the dose of Lantus. I told him no matter what, I would like to keep him on the metformin due to the cardiovascular benefits. Patient verbalized understanding.   Next A1C anticipated May 2018.    Written patient instructions provided.  Total time in face to face counseling 20 minutes.   Follow up in Pharmacist Clinic Visit in 2 weeks

## 2016-10-07 ENCOUNTER — Ambulatory Visit: Payer: Self-pay | Attending: Family Medicine

## 2016-10-10 ENCOUNTER — Other Ambulatory Visit: Payer: Self-pay | Admitting: *Deleted

## 2016-10-10 DIAGNOSIS — Z794 Long term (current) use of insulin: Principal | ICD-10-CM

## 2016-10-10 DIAGNOSIS — E118 Type 2 diabetes mellitus with unspecified complications: Secondary | ICD-10-CM

## 2016-10-10 MED ORDER — INSULIN ASPART 100 UNIT/ML FLEXPEN: PEN_INJECTOR | SUBCUTANEOUS | 3 refills | Status: DC

## 2016-10-10 MED ORDER — INSULIN GLARGINE 100 UNIT/ML SOLOSTAR PEN
30.0000 [IU] | PEN_INJECTOR | Freq: Every day | SUBCUTANEOUS | 3 refills | Status: DC
Start: 2016-10-10 — End: 2016-10-12

## 2016-10-10 NOTE — Telephone Encounter (Signed)
PRINTED FOR PASS PROGRAM 

## 2016-10-12 ENCOUNTER — Other Ambulatory Visit: Payer: Self-pay | Admitting: Family Medicine

## 2016-10-12 DIAGNOSIS — E118 Type 2 diabetes mellitus with unspecified complications: Secondary | ICD-10-CM

## 2016-10-12 DIAGNOSIS — Z794 Long term (current) use of insulin: Principal | ICD-10-CM

## 2016-10-12 MED ORDER — TRUEPLUS LANCETS 28G MISC: 1.0000 | Freq: Once | 12 refills | Status: AC

## 2016-10-12 MED ORDER — INSULIN GLARGINE 100 UNIT/ML ~~LOC~~ SOLN: 30.0000 [IU] | Freq: Every day | SUBCUTANEOUS | 11 refills | Status: DC

## 2016-10-12 MED ORDER — INSULIN SYRINGES (DISPOSABLE) U-100 1 ML MISC: 1.0000 | Freq: Once | 11 refills | Status: AC

## 2016-10-12 MED ORDER — FREESTYLE LANCETS MISC: 12 refills | Status: DC

## 2016-10-12 MED FILL — TRUEplus LANCETS 28G MISC: 33 days supply | Qty: 100 | Fill #0

## 2016-10-20 ENCOUNTER — Ambulatory Visit: Payer: Self-pay | Attending: Family Medicine | Admitting: Pharmacist

## 2016-10-20 DIAGNOSIS — Z794 Long term (current) use of insulin: Secondary | ICD-10-CM

## 2016-10-20 DIAGNOSIS — E119 Type 2 diabetes mellitus without complications: Secondary | ICD-10-CM | POA: Insufficient documentation

## 2016-10-20 DIAGNOSIS — E118 Type 2 diabetes mellitus with unspecified complications: Secondary | ICD-10-CM

## 2016-10-20 MED ORDER — INSULIN GLARGINE 100 UNIT/ML ~~LOC~~ SOLN: 35.0000 [IU] | Freq: Every day | SUBCUTANEOUS | 11 refills | Status: DC

## 2016-10-20 MED FILL — !LANTUS 100 UNITS/ML VIAL: 100 | 28 days supply | Qty: 10 | Fill #0

## 2016-10-20 NOTE — Patient Instructions (Signed)
Take the metformin every day - you can try 1 tablet with a meal for a week or so and then continue to increase over time to 2 tablets twice daily with food.  Increase Lantus to 35 units daily.  Come back in 2 weeks with your blood sugar meter.

## 2016-10-20 NOTE — Progress Notes (Signed)
    S:    Patient arrives in good spirits.  Presents for diabetes evaluation, education, and management at the request of Arrie SenateMandesia [name], NP and Dr. Hyman HopesJegede. Patient was referred on 09/23/16.  Patient was last seen by Primary Care Provider on 10/06/16.   Patient reports Diabetes was diagnosed about 7 years ago. He was started on metformin at that time and worked really hard on diet and exercise and was able to control his diabetes for multiple year. However, he eventually stopped these things.  Patient reports adherence with medications.  Current diabetes medications include: Lantus 26 units daily, Novolog sliding scale, metformin 1000 mg BID, and was just started on glipizide 5 mg daily. He still only needs to use 2-3 units of the Novolog sliding scale.  Patient denies hypoglycemic events.  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  Patient reported exercise habits: knows he needs to start exercising.   Patient denies nocturia.  Patient denies neuropathy. Patient denies visual changes. Patient reports self foot exams.    O:  Lab Results  Component Value Date   HGBA1C 11.7 (H) 09/02/2016   There were no vitals filed for this visit.  Home fasting CBG: 130s-140s 2 hour post-prandial/random CBG: 160s-200s   A/P: Diabetes longstanding currently uncontrolled based on A1c of 11.7 but under better control based on home CBGs. Patient denies hypoglycemic events and is able to verbalize appropriate hypoglycemia management plan. Patient reports adherence with medication. Control is suboptimal due to dietary indiscretion and sedentary lifestyle.  His fastings are still elevated so increase Lantus to 35 units daily. Patient will re-start titration of metformin XL and take 1 tablet daily and add a tablet each week as long as he is able to tolerate it up to 4 tablets daily. Hopefully this will allow  him to take it consistently. Encouraged him to make diet and lifestyle changes as he has not made any changes yet.   Next A1C anticipated May 2018.    Written patient instructions provided.  Total time in face to face counseling 20 minutes.   Follow up in Pharmacist Clinic Visit in 2 weeks

## 2016-10-21 MED FILL — !NOVOLOG FLEXPEN SYRINGE 1: 100/ML | 33 days supply | Qty: 15 | Fill #0

## 2016-10-24 ENCOUNTER — Other Ambulatory Visit: Payer: Self-pay | Admitting: *Deleted

## 2016-10-24 MED ORDER — INSULIN LISPRO 100 UNIT/ML (KWIKPEN): 0.0000 [IU] | PEN_INJECTOR | Freq: Three times a day (TID) | SUBCUTANEOUS | 3 refills | Status: DC

## 2016-10-24 NOTE — Telephone Encounter (Signed)
PRINTED FOR PASS PROGRAM 

## 2016-11-03 ENCOUNTER — Ambulatory Visit: Payer: Self-pay | Attending: Family Medicine | Admitting: Pharmacist

## 2016-11-03 DIAGNOSIS — E118 Type 2 diabetes mellitus with unspecified complications: Secondary | ICD-10-CM

## 2016-11-03 DIAGNOSIS — E1165 Type 2 diabetes mellitus with hyperglycemia: Secondary | ICD-10-CM | POA: Insufficient documentation

## 2016-11-03 DIAGNOSIS — Z79899 Other long term (current) drug therapy: Secondary | ICD-10-CM

## 2016-11-03 DIAGNOSIS — Z794 Long term (current) use of insulin: Secondary | ICD-10-CM | POA: Insufficient documentation

## 2016-11-03 MED ORDER — TRUEPLUS LANCETS 28G MISC: 12 refills | Status: DC

## 2016-11-03 MED ORDER — GLUCOSE BLOOD VI STRP: ORAL_STRIP | 12 refills | Status: DC

## 2016-11-03 MED ORDER — INSULIN GLARGINE 100 UNIT/ML ~~LOC~~ SOLN: 38.0000 [IU] | Freq: Every day | SUBCUTANEOUS | 11 refills | Status: DC

## 2016-11-03 MED FILL — LANTUS 100 UNITS/ML VIAL: 100 | 26 days supply | Qty: 10 | Fill #0

## 2016-11-03 MED FILL — TRUE METRIX TEST STRIP: 25 days supply | Qty: 100 | Fill #0

## 2016-11-03 MED FILL — METFORMIN HCL ER 500 MG TAB: 500 | 30 days supply | Qty: 120 | Fill #1

## 2016-11-03 MED FILL — LISINOPRIL 10 MG TABLET: 10 | 30 days supply | Qty: 30 | Fill #1

## 2016-11-03 MED FILL — TRUEplus LANCETS 28G MISC: 25 days supply | Qty: 100 | Fill #0

## 2016-11-03 MED FILL — GABAPENTIN 300 MG CAPSULE: 300 | 30 days supply | Qty: 30 | Fill #1

## 2016-11-03 NOTE — Progress Notes (Signed)
    S:    Patient arrives in good spirits.  Presents for diabetes evaluation, education, and management at the request of Arrie SenateMandesia [name], NP and Dr. Hyman HopesJegede. Patient was referred on 09/23/16.  Patient was last seen by Primary Care Provider on 10/06/16.   Patient reports Diabetes was diagnosed about 7 years ago. He was started on metformin at that time and worked really hard on diet and exercise and was able to control his diabetes for multiple year. However, he eventually stopped these things.  Patient reports adherence with medications.  Current diabetes medications include: Lantus 35 units daily, Novolog sliding scale, metformin XL 500 mg 2 tablets twice daily. He still only needs to use 2-3 units of the Novolog sliding scale.  Patient denies hypoglycemic events.  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  Patient reported exercise habits: knows he needs to start exercising.   Patient reports nocturia once per night, used to be 3-4 times.  Patient denies neuropathy. Patient denies visual changes. Patient reports self foot exams.    O:  Lab Results  Component Value Date   HGBA1C 11.7 (H) 09/02/2016   There were no vitals filed for this visit.  Home fasting CBG: 97,130s-140s 2 hour post-prandial/random CBG: 170s-180s, 2 readings in the 200s up to 220.  7 day average 141 14 day average155   A/P: Diabetes longstanding currently uncontrolled based on A1c of 11.7 but under better control based on home CBGs. Patient denies hypoglycemic events and is able to verbalize appropriate hypoglycemia management plan. Patient reports adherence with medication. Control is suboptimal due to dietary indiscretion and sedentary lifestyle.  Will increase Lantus to 38 units daily. Continue metformin and he can use Novolog as needed but the goal is to come off of the Novolog and then work on diet and  exercise and hopefully down titrate his Lantus dose. Would avoid other agents at this time to avoid hypoglycemia.   Next A1C anticipated late April/May 2018.    Written patient instructions provided.  Total time in face to face counseling 20 minutes.   Follow up in Pharmacist Clinic Visit in 4 weeks with Arrie SenateMandesia [name], NP for A1c and DM follow up. Patient seen with Cheral Bayasey Wells, PharmD Candidate

## 2016-11-03 NOTE — Patient Instructions (Addendum)
Thanks for coming to see [place]!  Increase Lantus to 38 units daily.   Follow up with Arrie Senate in 4 weeks (will be due for A1c and diabetes follow up at that time)   Carbohydrate Counting for Diabetes Mellitus, Adult Carbohydrate counting is a method for keeping track of how many carbohydrates you eat. Eating carbohydrates naturally increases the amount of sugar (glucose) in the blood. Counting how many carbohydrates you eat helps keep your blood glucose within normal limits, which helps you manage your diabetes (diabetes mellitus). It is important to know how many carbohydrates you can safely have in each meal. This is different for every person. A diet and nutrition specialist (registered dietitian) can help you make a meal plan and calculate how many carbohydrates you should have at each meal and snack. Carbohydrates are found in the following foods:  Grains, such as breads and cereals.  Dried beans and soy products.  Starchy vegetables, such as potatoes, peas, and corn.  Fruit and fruit juices.  Milk and yogurt.  Sweets and snack foods, such as cake, cookies, candy, chips, and soft drinks. How do I count carbohydrates? There are two ways to count carbohydrates in food. You can use either of the methods or a combination of both. Reading "Nutrition Facts" on packaged food  The "Nutrition Facts" list is included on the labels of almost all packaged foods and beverages in the [place] It includes:  The serving size.  Information about nutrients in each serving, including the grams (g) of carbohydrate per serving. To use the "Nutrition Facts":  Decide how many servings you will have.  Multiply the number of servings by the number of carbohydrates per serving.  The resulting number is the total amount of carbohydrates that you will be having. Learning standard serving sizes of other foods  When you eat foods containing carbohydrates that are not packaged or do not include  "Nutrition Facts" on the label, you need to measure the servings in order to count the amount of carbohydrates:  Measure the foods that you will eat with a food scale or measuring cup, if needed.  Decide how many standard-size servings you will eat.  Multiply the number of servings by 15. Most carbohydrate-rich foods have about 15 g of carbohydrates per serving.  For example, if you eat 8 oz (170 g) of strawberries, you will have eaten 2 servings and 30 g of carbohydrates (2 servings x 15 g = 30 g).  For foods that have more than one food mixed, such as soups and casseroles, you must count the carbohydrates in each food that is included. The following list contains standard serving sizes of common carbohydrate-rich foods. Each of these servings has about 15 g of carbohydrates:   hamburger bun or  English muffin.   oz (15 mL) syrup.   oz (14 g) jelly.  1 slice of bread.  1 six-inch tortilla.  3 oz (85 g) cooked rice or pasta.  4 oz (113 g) cooked dried beans.  4 oz (113 g) starchy vegetable, such as peas, corn, or potatoes.  4 oz (113 g) hot cereal.  4 oz (113 g) mashed potatoes or  of a large baked potato.  4 oz (113 g) canned or frozen fruit.  4 oz (120 mL) fruit juice.  4-6 crackers.  6 chicken nuggets.  6 oz (170 g) unsweetened dry cereal.  6 oz (170 g) plain fat-free yogurt or yogurt sweetened with artificial sweeteners.  8 oz (240 mL)  milk.  8 oz (170 g) fresh fruit or one small piece of fruit.  24 oz (680 g) popped popcorn. Example of carbohydrate counting Sample meal   3 oz (85 g) chicken breast.  6 oz (170 g) brown rice.  4 oz (113 g) corn.  8 oz (240 mL) milk.  8 oz (170 g) strawberries with sugar-free whipped topping. Carbohydrate calculation  1. Identify the foods that contain carbohydrates:  Rice.  Corn.  Milk.  Strawberries. 2. Calculate how many servings you have of each food:  2 servings rice.  1 serving corn.  1  serving milk.  1 serving strawberries. 3. Multiply each number of servings by 15 g:  2 servings rice x 15 g = 30 g.  1 serving corn x 15 g = 15 g.  1 serving milk x 15 g = 15 g.  1 serving strawberries x 15 g = 15 g. 4. Add together all of the amounts to find the total grams of carbohydrates eaten:  30 g + 15 g + 15 g + 15 g = 75 g of carbohydrates total. This information is not intended to replace advice given to you by your health care provider. Make sure you discuss any questions you have with your health care provider. Document Released: 07/25/2005 Document Revised: 02/12/2016 Document Reviewed: 01/06/2016 Elsevier Interactive Patient Education  2017 [employer]

## 2016-11-08 ENCOUNTER — Ambulatory Visit: Payer: Self-pay | Attending: Family Medicine

## 2016-11-09 ENCOUNTER — Encounter: Payer: Self-pay | Admitting: Family Medicine

## 2016-11-09 ENCOUNTER — Ambulatory Visit: Payer: Self-pay | Attending: Family Medicine | Admitting: Family Medicine

## 2016-11-09 VITALS — BP 127/84 | HR 95 | Temp 98.3°F | Resp 18 | Ht 71.0 in | Wt 296.6 lb

## 2016-11-09 DIAGNOSIS — Z79899 Other long term (current) drug therapy: Secondary | ICD-10-CM | POA: Insufficient documentation

## 2016-11-09 DIAGNOSIS — L02611 Cutaneous abscess of right foot: Secondary | ICD-10-CM

## 2016-11-09 DIAGNOSIS — L03031 Cellulitis of right toe: Secondary | ICD-10-CM | POA: Insufficient documentation

## 2016-11-09 DIAGNOSIS — Z8781 Personal history of (healed) traumatic fracture: Secondary | ICD-10-CM

## 2016-11-09 DIAGNOSIS — E118 Type 2 diabetes mellitus with unspecified complications: Secondary | ICD-10-CM

## 2016-11-09 DIAGNOSIS — Z794 Long term (current) use of insulin: Secondary | ICD-10-CM | POA: Insufficient documentation

## 2016-11-09 DIAGNOSIS — L97521 Non-pressure chronic ulcer of other part of left foot limited to breakdown of skin: Secondary | ICD-10-CM | POA: Insufficient documentation

## 2016-11-09 DIAGNOSIS — E1140 Type 2 diabetes mellitus with diabetic neuropathy, unspecified: Secondary | ICD-10-CM

## 2016-11-09 DIAGNOSIS — E119 Type 2 diabetes mellitus without complications: Secondary | ICD-10-CM | POA: Insufficient documentation

## 2016-11-09 LAB — GLUCOSE, POCT (MANUAL RESULT ENTRY): POC GLUCOSE: 175 mg/dL — AB (ref 70–99)

## 2016-11-09 MED ORDER — BACITRACIN 500 UNIT/GM EX OINT: 1.0000 "application " | TOPICAL_OINTMENT | Freq: Once | CUTANEOUS | Status: DC

## 2016-11-09 MED ORDER — IBUPROFEN 800 MG PO TABS: 800.0000 mg | ORAL_TABLET | Freq: Three times a day (TID) | ORAL | 0 refills | Status: DC | PRN

## 2016-11-09 MED ORDER — GABAPENTIN 300 MG PO CAPS: 300.0000 mg | ORAL_CAPSULE | Freq: Two times a day (BID) | ORAL | 2 refills | Status: DC

## 2016-11-09 MED ORDER — INSULIN GLARGINE 100 UNIT/ML ~~LOC~~ SOLN: 40.0000 [IU] | Freq: Every day | SUBCUTANEOUS | 11 refills | Status: DC

## 2016-11-09 MED ORDER — SULFAMETHOXAZOLE-TRIMETHOPRIM 800-160 MG PO TABS: 1.0000 | ORAL_TABLET | Freq: Every day | ORAL | 0 refills | Status: DC

## 2016-11-09 MED FILL — SULFAMETHOXAZOLE/TMP DS TAB: 800-160 | 14 days supply | Qty: 14 | Fill #0

## 2016-11-09 NOTE — Progress Notes (Signed)
Subjective:  Patient ID: [name], male    DOB: 23-Jun-1966  Age: 51 y.o. MRN: [id number]  CC: Establish Care   HPI [name] presents for   Right foot pain: 3 weeks history of pain and increased sensitivity to right foot. He also reports paresthesias to bilateral feet.  He denies any swelling, redness, wounds, or  History of significant injury. He reports taking ibuprofen and acetaminophen for pain with minimal relief of symptoms. Reports pain 8/10.   DM: CBG at home range between 140-150 before meals and 170 to 180's after meals. Reports noticing CBG are significantly higher in the am 150's to 160's. Reports attempting to increase physical activity.     Outpatient Medications Prior to Visit  Medication Sig Dispense Refill  . bacitracin 500 UNIT/GM ointment Apply 1 application topically 2 (two) times daily. Apply to affected areas for 5 days. (Patient not taking: Reported on 10/06/2016) 15 g 0  . glucose blood (TRUE METRIX BLOOD GLUCOSE TEST) test strip Use as instructed 100 each 12  . glucose monitoring kit (FREESTYLE) monitoring kit 1 each by Does not apply route 4 (four) times daily - after meals and at bedtime. 1 month Diabetic Testing Supplies for QAC-QHS accuchecks. 1 each 1  . insulin aspart (NOVOLOG FLEXPEN) 100 UNIT/ML FlexPen 0-15 Units, Subcutaneous, 3 times daily with meals CBG < 70: implement hypoglycemia protocol-call MD CBG 70 - 120: 0 units CBG 121 - 150: 2 units CBG 151 - 200: 3 units CBG 201 - 250: 5 units CBG 251 - 300: 8 units CBG 301 - 350: 11 units CBG 351 - 400: 15 units CBG > 400: 45 mL 3  . insulin lispro (HUMALOG KWIKPEN) 100 UNIT/ML KiwkPen Inject 0-0.15 mLs (0-15 Units total) into the skin 3 (three) times daily with meals. 45 mL 3  . Insulin Pen Needle 32G X 8 MM MISC Use as directed 100 each 0  . lisinopril (PRINIVIL,ZESTRIL) 10 MG tablet Take 1 tablet (10 mg total) by mouth daily. 30 tablet 0  . lisinopril (PRINIVIL,ZESTRIL) 10 MG tablet Take 1  tablet (10 mg total) by mouth daily. 30 tablet 2  . metFORMIN (GLUCOPHAGE XR) 500 MG 24 hr tablet Take 2 tablets (1,000 mg total) by mouth 2 (two) times daily with a meal. 120 tablet 3  . oxyCODONE (ROXICODONE) 5 MG immediate release tablet Take 1-2 tablets (5-10 mg total) by mouth every 6 (six) hours as needed for breakthrough pain (take between percocet for breakthrough pain only). 50 tablet 0  . oxyCODONE-acetaminophen (PERCOCET) 10-325 MG tablet Take 1 tablet by mouth every 6 (six) hours as needed for pain. 60 tablet 0  . promethazine (PHENERGAN) 12.5 MG tablet Take 1-2 tablets (12.5-25 mg total) by mouth every 6 (six) hours as needed for nausea or vomiting. 30 tablet 0  . TRUEPLUS LANCETS 28G MISC Use as directed 100 each 12  . gabapentin (NEURONTIN) 300 MG capsule Take 1 capsule (300 mg total) by mouth at bedtime. 90 capsule 2  . ibuprofen (ADVIL,MOTRIN) 800 MG tablet Take 1 tablet (800 mg total) by mouth every 8 (eight) hours as needed. (Patient not taking: Reported on 10/06/2016) 30 tablet 0  . insulin glargine (LANTUS) 100 UNIT/ML injection Inject 0.38 mLs (38 Units total) into the skin at bedtime. 10 mL 11   No facility-administered medications prior to visit.     ROS Review of Systems  Respiratory: Negative.   Cardiovascular: Negative.   Gastrointestinal: Negative.   Musculoskeletal: Positive for myalgias.  Right foot pain  Neurological:       Paresthesias to both feet.     Objective:  BP 127/84 (BP Location: Left Arm, Patient Position: Sitting, Cuff Size: Normal)   Pulse 95   Temp 98.3 F (36.8 C) (Oral)   Resp 18   Ht '5\' 11"'  (1.803 m)   Wt 296 lb 9.6 oz (134.5 kg)   SpO2 95%   BMI 41.37 kg/m   BP/Weight 11/09/2016 10/06/2016 7/37/1062  Systolic BP 694 854 627  Diastolic BP 84 87 80  Wt. (Lbs) 296.6 301.6 291.8  BMI 41.37 40.9 39.58   Physical Exam  Cardiovascular: Normal rate, regular rhythm, normal heart sounds and intact distal pulses.   Pulmonary/Chest:  Effort normal and breath sounds normal.  Abdominal: Soft. Bowel sounds are normal.  Musculoskeletal: Normal range of motion.  Skin: Skin is warm and dry. Ecchymosis and lesion noted. There is erythema (right great toe, lateral. swelling present.).  Wound to right great toe at lateral nailbed. Serous drainage present. Wound to left foot at 3rd digit. Wound is under the base of nailbed. Dried blood present. Area of ecchymosis to posterior left great toe.  Nursing note and vitals reviewed.    Diabetic Foot Exam - Simple   Simple Foot Form Diabetic Foot exam was performed with the following findings:  Yes 11/09/2016  9:20 AM  Visual Inspection See comments:  Yes Sensation Testing Intact to touch and monofilament testing bilaterally:  Yes Pulse Check Posterior Tibialis and Dorsalis pulse intact bilaterally:  Yes Comments Wound to right great toe at lateral nailbed. Serous drainage present. Wound to left foot at 3rd digit. Wound is under the base of nailbed. Dried blood present. Area of ecchymosis to posterior left great toe.       Assessment & Plan:   Problem List Items Addressed This Visit      Endocrine   Diabetes ([place])   -Inc.dosage of Lantus to 40 units.   Relevant Medications   insulin glargine (LANTUS) 100 UNIT/ML injection   gabapentin (NEURONTIN) 300 MG capsule   Other Relevant Orders   Glucose (CBG) (Completed)    Other Visit Diagnoses    Cellulitis and abscess of toe of right foot    -  Primary   -[place] CSRS was checked. Patient was recently prescribed Tramadol.         -Patient informed of CSRS reporting results.   Relevant Medications   bacitracin ointment 1 application   sulfamethoxazole-trimethoprim (BACTRIM DS,SEPTRA DS) 800-160 MG tablet   ibuprofen (ADVIL,MOTRIN) 800 MG tablet   Other Relevant Orders   Ambulatory referral to Podiatry   AMB referral to wound care center   Skin ulcer of left foot, limited to breakdown of skin ([place])       -Wounds cleansed with  NS, bacitracin ointment applied w/ dressing.   Relevant Medications   bacitracin ointment 1 application   Other Relevant Orders   Ambulatory referral to Podiatry   AMB referral to wound care center   History of arm fracture          Meds ordered this encounter  Medications  . insulin glargine (LANTUS) 100 UNIT/ML injection    Sig: Inject 0.4 mLs (40 Units total) into the skin at bedtime.    Dispense:  10 mL    Refill:  11    Order Specific Question:   Supervising Provider    Answer:   Tresa Garter [id number]  . gabapentin (NEURONTIN) 300 MG capsule  Sig: Take 1 capsule (300 mg total) by mouth 2 (two) times daily.    Dispense:  60 capsule    Refill:  2    Order Specific Question:   Supervising Provider    Answer:   Tresa Garter [id number]  . bacitracin ointment 1 application  . sulfamethoxazole-trimethoprim (BACTRIM DS,SEPTRA DS) 800-160 MG tablet    Sig: Take 1 tablet by mouth daily.    Dispense:  14 tablet    Refill:  0    Order Specific Question:   Supervising Provider    Answer:   Tresa Garter [id number]  . ibuprofen (ADVIL,MOTRIN) 800 MG tablet    Sig: Take 1 tablet (800 mg total) by mouth every 8 (eight) hours as needed for moderate pain.    Dispense:  20 tablet    Refill:  0    Order Specific Question:   Supervising Provider    Answer:   Tresa Garter [id number]    Follow-up: Return if symptoms worsen or fail to improve. Return in about 1 month (around 12/09/2016), for Diabetes.   Alfonse Spruce FNP

## 2016-11-09 NOTE — Progress Notes (Signed)
Patient is here for a throbbing  pain in his right foot arch   Patient have it since two weeks ago  Patient stated the pain like a bone pain worst when walks on it  Also patient stated that he had surgery on his right arm & he has some pain on it

## 2016-11-09 NOTE — Patient Instructions (Signed)
Cellulitis, Adult Cellulitis is a skin infection. The infected area is usually red and sore. This condition occurs most often in the arms and lower legs. It is very important to get treated for this condition. Follow these instructions at home:  Take over-the-counter and prescription medicines only as told by your doctor.  If you were prescribed an antibiotic medicine, take it as told by your doctor. Do not stop taking the antibiotic even if you start to feel better.  Drink enough fluid to keep your pee (urine) clear or pale yellow.  Do not touch or rub the infected area.  Raise (elevate) the infected area above the level of your heart while you are sitting or lying down.  Place warm or cold wet cloths (warm or cold compresses) on the infected area. Do this as told by your doctor.  Keep all follow-up visits as told by your doctor. This is important. These visits let your doctor make sure your infection is not getting worse. Contact a doctor if:  You have a fever.  Your symptoms do not get better after 1-2 days of treatment.  Your bone or joint under the infected area starts to hurt after the skin has healed.  Your infection comes back. This can happen in the same area or another area.  You have a swollen bump in the infected area.  You have new symptoms.  You feel ill and also have muscle aches and pains. Get help right away if:  Your symptoms get worse.  You feel very sleepy.  You throw up (vomit) or have watery poop (diarrhea) for a long time.  There are red streaks coming from the infected area.  Your red area gets larger.  Your red area turns darker. This information is not intended to replace advice given to you by your health care provider. Make sure you discuss any questions you have with your health care provider. Document Released: 01/11/2008 Document Revised: 12/31/2015 Document Reviewed: 06/03/2015 Elsevier Interactive Patient Education  2017 [employer]vier  Inc.  

## 2016-11-15 ENCOUNTER — Encounter (HOSPITAL_BASED_OUTPATIENT_CLINIC_OR_DEPARTMENT_OTHER): Payer: Self-pay | Attending: Surgery

## 2016-11-15 DIAGNOSIS — Z794 Long term (current) use of insulin: Secondary | ICD-10-CM | POA: Insufficient documentation

## 2016-11-15 DIAGNOSIS — Z9119 Patient's noncompliance with other medical treatment and regimen: Secondary | ICD-10-CM | POA: Insufficient documentation

## 2016-11-15 DIAGNOSIS — E11621 Type 2 diabetes mellitus with foot ulcer: Secondary | ICD-10-CM | POA: Insufficient documentation

## 2016-11-15 DIAGNOSIS — Z7984 Long term (current) use of oral hypoglycemic drugs: Secondary | ICD-10-CM | POA: Insufficient documentation

## 2016-11-15 DIAGNOSIS — L97512 Non-pressure chronic ulcer of other part of right foot with fat layer exposed: Secondary | ICD-10-CM | POA: Insufficient documentation

## 2016-11-15 DIAGNOSIS — E1165 Type 2 diabetes mellitus with hyperglycemia: Secondary | ICD-10-CM | POA: Insufficient documentation

## 2016-11-15 DIAGNOSIS — L600 Ingrowing nail: Secondary | ICD-10-CM | POA: Insufficient documentation

## 2016-11-15 DIAGNOSIS — E1140 Type 2 diabetes mellitus with diabetic neuropathy, unspecified: Secondary | ICD-10-CM | POA: Insufficient documentation

## 2016-11-15 DIAGNOSIS — Z79899 Other long term (current) drug therapy: Secondary | ICD-10-CM | POA: Insufficient documentation

## 2016-11-15 DIAGNOSIS — L03031 Cellulitis of right toe: Secondary | ICD-10-CM | POA: Insufficient documentation

## 2016-11-15 DIAGNOSIS — Z87891 Personal history of nicotine dependence: Secondary | ICD-10-CM | POA: Insufficient documentation

## 2016-11-15 DIAGNOSIS — I10 Essential (primary) hypertension: Secondary | ICD-10-CM | POA: Insufficient documentation

## 2016-11-18 ENCOUNTER — Encounter: Payer: Self-pay | Admitting: Podiatry

## 2016-11-18 ENCOUNTER — Ambulatory Visit (INDEPENDENT_AMBULATORY_CARE_PROVIDER_SITE_OTHER): Payer: Self-pay | Admitting: Podiatry

## 2016-11-18 DIAGNOSIS — L600 Ingrowing nail: Secondary | ICD-10-CM

## 2016-11-18 DIAGNOSIS — M79676 Pain in unspecified toe(s): Secondary | ICD-10-CM

## 2016-11-18 DIAGNOSIS — L03039 Cellulitis of unspecified toe: Secondary | ICD-10-CM

## 2016-11-18 NOTE — Patient Instructions (Signed)

## 2016-11-19 NOTE — Progress Notes (Signed)
   Subjective: Patient is a 51 year old male with PMHx of T2DM presenting today as a new patient complaining of pain, redness and swelling to the lateral side of the right great toenail for the past 3 weeks. He reports associated clear drainage from the area. He also complains of right arch pain for the past 3 weeks. He has seen his PCP and was prescribed Bactrim DS which he reports taking as directed. Patient presents today for further treatment and evaluation.  Objective:  General: Well developed, nourished, in no acute distress, alert and oriented x3   Dermatology: Skin is warm, dry and supple bilateral. Lateral border of the right great toe appears to be erythematous with evidence of an ingrowing nail. Pain on palpation noted to the border of the nail fold. The remaining nails appear unremarkable at this time. There are no open sores, lesions.  Vascular: Dorsalis Pedis artery and Posterior Tibial artery pedal pulses palpable. No lower extremity edema noted.   Neruologic: Grossly intact via light touch bilateral.  Musculoskeletal: Pain with palpation to the right plantar fascia Muscular strength within normal limits in all groups bilateral. Normal range of motion noted to all pedal and ankle joints.   Assesement: #1 Paronychia with ingrowing nail lateral border of the right great toe #2 Pain in toe #3 Incurvated nail  Plan of Care:  1. Patient evaluated.  2. Discussed treatment alternatives and plan of care. Explained nail avulsion procedure and post procedure course to patient. 3. Patient opted for permanent partial nail avulsion.  4. Prior to procedure, local anesthesia infiltration utilized using 3 ml of a 50:50 mixture of 2% plain lidocaine and 0.5% plain marcaine in a normal hallux block fashion and a betadine prep performed.  5. Partial permanent nail avulsion with chemical matrixectomy performed using 3x30sec applications of phenol followed by alcohol flush.  6. Light dressing  applied. 7. Continue Bactrim from PCP 8. Return to clinic in 2 weeks.   Felecia Shelling, DPM [facility]  Dr. Felecia Shelling, DPM    [address]                                        [place], [place] [address]                Office [phone]  Fax [phone]

## 2016-11-24 MED FILL — GABAPENTIN 300 MG CAPSULE: 300 | 30 days supply | Qty: 30 | Fill #2

## 2016-11-24 MED FILL — TRUEplus LANCETS 28G MISC: 25 days supply | Qty: 100 | Fill #1

## 2016-11-24 MED FILL — LISINOPRIL 10 MG TABLET: 10 | 30 days supply | Qty: 30 | Fill #2

## 2016-12-01 ENCOUNTER — Ambulatory Visit: Payer: Self-pay | Attending: Family Medicine | Admitting: Family Medicine

## 2016-12-01 ENCOUNTER — Encounter: Payer: Self-pay | Admitting: Family Medicine

## 2016-12-01 VITALS — BP 141/87 | HR 89 | Temp 98.2°F | Resp 18 | Ht 72.0 in | Wt 297.2 lb

## 2016-12-01 DIAGNOSIS — Z9049 Acquired absence of other specified parts of digestive tract: Secondary | ICD-10-CM | POA: Insufficient documentation

## 2016-12-01 DIAGNOSIS — L03031 Cellulitis of right toe: Secondary | ICD-10-CM

## 2016-12-01 DIAGNOSIS — R400 Somnolence: Secondary | ICD-10-CM

## 2016-12-01 DIAGNOSIS — E119 Type 2 diabetes mellitus without complications: Secondary | ICD-10-CM | POA: Insufficient documentation

## 2016-12-01 DIAGNOSIS — R0683 Snoring: Secondary | ICD-10-CM

## 2016-12-01 DIAGNOSIS — Z794 Long term (current) use of insulin: Secondary | ICD-10-CM

## 2016-12-01 DIAGNOSIS — E118 Type 2 diabetes mellitus with unspecified complications: Secondary | ICD-10-CM

## 2016-12-01 DIAGNOSIS — L02611 Cutaneous abscess of right foot: Secondary | ICD-10-CM

## 2016-12-01 DIAGNOSIS — I10 Essential (primary) hypertension: Secondary | ICD-10-CM

## 2016-12-01 LAB — POCT UA - MICROALBUMIN
CREATININE, POC: 200 mg/dL
MICROALBUMIN (UR) POC: 80 mg/L

## 2016-12-01 LAB — GLUCOSE, POCT (MANUAL RESULT ENTRY): POC GLUCOSE: 171 mg/dL — AB (ref 70–99)

## 2016-12-01 LAB — POCT GLYCOSYLATED HEMOGLOBIN (HGB A1C): HEMOGLOBIN A1C: 6.7

## 2016-12-01 MED ORDER — SULFAMETHOXAZOLE-TRIMETHOPRIM 800-160 MG PO TABS: 1.0000 | ORAL_TABLET | Freq: Two times a day (BID) | ORAL | 0 refills | Status: AC

## 2016-12-01 MED ORDER — BACITRACIN 500 UNIT/GM EX OINT: 1.0000 "application " | TOPICAL_OINTMENT | Freq: Once | CUTANEOUS | Status: DC

## 2016-12-01 MED ORDER — LISINOPRIL 10 MG PO TABS
10.0000 mg | ORAL_TABLET | Freq: Every day | ORAL | 0 refills | Status: DC
Start: 2016-12-01 — End: 2016-12-01

## 2016-12-01 MED ORDER — LISINOPRIL 10 MG PO TABS: 10.0000 mg | ORAL_TABLET | Freq: Every day | ORAL | 0 refills | Status: DC

## 2016-12-01 MED FILL — SULFAMETHOXAZOLE/TMP DS TAB: 800-160 | 7 days supply | Qty: 14 | Fill #0

## 2016-12-01 NOTE — Patient Instructions (Addendum)
Follow up with [facility] center appointment. Follow up with orthopedics. Check CBG's three times a day and bring glucometer or log to next office visit.  Low-Fat Diet for Pancreatitis or Gallbladder Conditions A low-fat diet can be helpful if you have pancreatitis or a gallbladder condition. With these conditions, your pancreas and gallbladder have trouble digesting fats. A healthy eating plan with less fat will help rest your pancreas and gallbladder and reduce your symptoms. What do I need to know about this diet?  Eat a low-fat diet.  Reduce your fat intake to less than 20-30% of your total daily calories. This is less than 50-60 g of fat per day.  Remember that you need some fat in your diet. Ask your dietician what your daily goal should be.  Choose nonfat and low-fat healthy foods. Look for the words "nonfat," "low fat," or "fat free."  As a guide, look on the label and choose foods with less than 3 g of fat per serving. Eat only one serving.  Avoid alcohol.  Do not smoke. If you need help quitting, talk with your health care provider.  Eat small frequent meals instead of three large heavy meals. What foods can I eat? Grains  Include healthy grains and starches such as potatoes, wheat bread, fiber-rich cereal, and brown rice. Choose whole grain options whenever possible. In adults, whole grains should account for 45-65% of your daily calories. Fruits and Vegetables  Eat plenty of fruits and vegetables. Fresh fruits and vegetables add fiber to your diet. Meats and Other Protein Sources  Eat lean meat such as chicken and pork. Trim any fat off of meat before cooking it. Eggs, fish, and beans are other sources of protein. In adults, these foods should account for 10-35% of your daily calories. Dairy  Choose low-fat milk and dairy options. Dairy includes fat and protein, as well as calcium. Fats and Oils  Limit high-fat foods such as fried foods, sweets, baked goods, sugary  drinks. Other  Creamy sauces and condiments, such as mayonnaise, can add extra fat. Think about whether or not you need to use them, or use smaller amounts or low fat options. What foods are not recommended?  High fat foods, such as:  [employer].  Ice cream.  [place] toast.  Sweet rolls.  Pizza.  Cheese bread.  Foods covered with batter, butter, creamy sauces, or cheese.  Fried foods.  Sugary drinks and desserts.  Foods that cause gas or bloating This information is not intended to replace advice given to you by your health care provider. Make sure you discuss any questions you have with your health care provider. Document Released: 07/30/2013 Document Revised: 12/31/2015 Document Reviewed: 07/08/2013 Elsevier Interactive Patient Education  2017 [employer].

## 2016-12-01 NOTE — Progress Notes (Signed)
Patient is here for f/up   Patient complains about right humerus pain its getting better tough  also he had a surgery on his toe he had a ingrown nail   Patient has not taking his current medication for today  Patient has eaten for today

## 2016-12-01 NOTE — Progress Notes (Signed)
Subjective:  Patient ID: [name], male    DOB: [birth year]  Age: 51 y.o. MRN: [id number]  CC: No chief complaint on file.   HPI [name] presents for  DM: Patient is here for follow-up of diabetes. He reports taking metformin and Latus. Reports not taking metformin for 5 days last week due to frequent loose stools. He reports restarting Metformin 2 days ago but denies any loose stools after restarting metformin. He was asked about any history of GI issues. He does report history of cholecystectomy over 10 years ago. Denies adhering  to low fat diet. He reports taking Lantus 38 units instead of the 40 units that was prescribed. CBG's taken TID. AM range 140-170 Afternoon range 120-140 PM range 120-180. Complains of increased CBG's in the am.   Snoring: Patient presents with possible obstructive sleep apnea. Patent has a 7 year history of symptoms of daytime fatigue, daytime sleepiness, and loud snoring. Patient generally gets 6 or 7 hours of sleep per night, and states they generally have nightime awakenings. Snoring of severe severity is present. Unsure if apneic episodes are present. Nasal obstruction is not present.  Patient has not had tonsillectomy.     Outpatient Medications Prior to Visit  Medication Sig Dispense Refill  . gabapentin (NEURONTIN) 300 MG capsule Take 1 capsule (300 mg total) by mouth 2 (two) times daily. 60 capsule 2  . glucose blood (TRUE METRIX BLOOD GLUCOSE TEST) test strip Use as instructed 100 each 12  . glucose monitoring kit (FREESTYLE) monitoring kit 1 each by Does not apply route 4 (four) times daily - after meals and at bedtime. 1 month Diabetic Testing Supplies for QAC-QHS accuchecks. 1 each 1  . insulin aspart (NOVOLOG FLEXPEN) 100 UNIT/ML FlexPen 0-15 Units, Subcutaneous, 3 times daily with meals CBG < 70: implement hypoglycemia protocol-call MD CBG 70 - 120: 0 units CBG 121 - 150: 2 units CBG 151 - 200: 3 units CBG 201 - 250: 5 units CBG 251 -  300: 8 units CBG 301 - 350: 11 units CBG 351 - 400: 15 units CBG > 400: 45 mL 3  . insulin glargine (LANTUS) 100 UNIT/ML injection Inject 0.4 mLs (40 Units total) into the skin at bedtime. 10 mL 11  . Insulin Pen Needle 32G X 8 MM MISC Use as directed 100 each 0  . metFORMIN (GLUCOPHAGE XR) 500 MG 24 hr tablet Take 2 tablets (1,000 mg total) by mouth 2 (two) times daily with a meal. 120 tablet 3  . TRUEPLUS LANCETS 28G MISC Use as directed 100 each 12  . lisinopril (PRINIVIL,ZESTRIL) 10 MG tablet Take 1 tablet (10 mg total) by mouth daily. 30 tablet 0  . insulin lispro (HUMALOG KWIKPEN) 100 UNIT/ML KiwkPen Inject 0-0.15 mLs (0-15 Units total) into the skin 3 (three) times daily with meals. (Patient not taking: Reported on 12/01/2016) 45 mL 3  . sulfamethoxazole-trimethoprim (BACTRIM DS,SEPTRA DS) 800-160 MG tablet Take 1 tablet by mouth daily. (Patient not taking: Reported on 12/01/2016) 14 tablet 0   Facility-Administered Medications Prior to Visit  Medication Dose Route Frequency Provider Last Rate Last Dose  . bacitracin ointment 1 application  1 application Topical Once Alfonse Spruce, FNP        ROS Review of Systems  Constitutional: Positive for fatigue (daytime).  HENT:       Snoring  Eyes: Negative.   Respiratory: Negative.   Cardiovascular: Negative.   Gastrointestinal: Negative.        Loose  stools  Skin: Negative.   Neurological: Negative.     Objective:  BP (!) 141/87 (BP Location: Left Arm, Patient Position: Sitting, Cuff Size: Normal)   Pulse 89   Temp 98.2 F (36.8 C) (Oral)   Resp 18   Ht 6' (1.829 m)   Wt 297 lb 3.2 oz (134.8 kg)   SpO2 94%   BMI 40.31 kg/m   BP/Weight 12/01/2016 03/09/1571 01/07/354  Systolic BP 974 163 845  Diastolic BP 87 84 87  Wt. (Lbs) 297.2 296.6 301.6  BMI 40.31 41.37 40.9     Physical Exam  HENT:  Head: Normocephalic.  Right Ear: External ear normal.  Left Ear: External ear normal.  Nose: Nose normal.  Mouth/Throat:  Oropharynx is clear and moist.  Eyes: Conjunctivae are normal. Pupils are equal, round, and reactive to light.  Neck: Normal range of motion. No thyromegaly present.  Cardiovascular: Normal rate, regular rhythm, normal heart sounds and intact distal pulses.   Pulmonary/Chest: Effort normal and breath sounds normal.  Abdominal: Soft. Bowel sounds are normal. He exhibits no mass. There is no tenderness.  Lymphadenopathy:    He has no cervical adenopathy.  Skin: Skin is warm and dry. There is erythema (right great toe; no draianage.).  Psychiatric: He has a normal mood and affect.  Nursing note and vitals reviewed.   Assessment & Plan:   Problem List Items Addressed This Visit      Cardiovascular and Mediastinum   Essential hypertension, benign   Relevant Medications   lisinopril (PRINIVIL,ZESTRIL) 10 MG tablet     Endocrine   Diabetes ([place]) - Primary   Relevant Medications   lisinopril (PRINIVIL,ZESTRIL) 10 MG tablet   Other Relevant Orders   Glucose (CBG) (Completed)   POCT glycosylated hemoglobin (Hb A1C) (Completed)   POCT UA - Microalbumin (Completed)   Ambulatory referral to Ophthalmology    Other Visit Diagnoses    Cellulitis and abscess of toe of right foot       Reports following up with wound care center. History of ingrown toenail with cellulitis    Some erythema still present will add another week of antibiotic coverage.    Relevant Medications   bacitracin ointment 1 application   sulfamethoxazole-trimethoprim (BACTRIM DS,SEPTRA DS) 800-160 MG tablet   Snoring       Relevant Orders   Ambulatory referral to Neurology   Daytime sleepiness       Relevant Orders   Ambulatory referral to Neurology      Meds ordered this encounter  Medications  . bacitracin ointment 1 application  . DISCONTD: lisinopril (PRINIVIL,ZESTRIL) 10 MG tablet    Sig: Take 1 tablet (10 mg total) by mouth daily.    Dispense:  30 tablet    Refill:  0    Order Specific Question:    Supervising Provider    Answer:   Tresa Garter [id number]  . sulfamethoxazole-trimethoprim (BACTRIM DS,SEPTRA DS) 800-160 MG tablet    Sig: Take 1 tablet by mouth 2 (two) times daily. For 7 days.    Dispense:  14 tablet    Refill:  0    Order Specific Question:   Supervising Provider    Answer:   Tresa Garter [id number]  . lisinopril (PRINIVIL,ZESTRIL) 10 MG tablet    Sig: Take 1 tablet (10 mg total) by mouth daily.    Dispense:  90 tablet    Refill:  0    Order Specific Question:   Supervising Provider  AnswerTresa Garter [[id number]]    Follow-up: Return in about 2 weeks (around 12/15/2016) for Back Concerns.   Alfonse Spruce FNP

## 2016-12-02 ENCOUNTER — Ambulatory Visit (INDEPENDENT_AMBULATORY_CARE_PROVIDER_SITE_OTHER): Payer: Self-pay | Admitting: Podiatry

## 2016-12-02 ENCOUNTER — Encounter: Payer: Self-pay | Admitting: Podiatry

## 2016-12-02 DIAGNOSIS — M79676 Pain in unspecified toe(s): Secondary | ICD-10-CM

## 2016-12-02 DIAGNOSIS — S91109D Unspecified open wound of unspecified toe(s) without damage to nail, subsequent encounter: Secondary | ICD-10-CM

## 2016-12-02 DIAGNOSIS — S91209D Unspecified open wound of unspecified toe(s) with damage to nail, subsequent encounter: Secondary | ICD-10-CM

## 2016-12-03 NOTE — Progress Notes (Signed)
   Subjective: Patient presents today 2 weeks post ingrown nail permanent nail avulsion procedure. Patient states that the toe and nail fold is feeling much better, but is slightly red. He saw his PCP yesterday and was prescribed antibiotics due to the redness.  Objective: Skin is warm, dry and supple. Nail and respective nail fold appears to be healing appropriately. Open wound to the associated nail fold with a granular wound base and moderate amount of fibrotic tissue. Minimal drainage noted. Mild erythema around the periungual region likely due to phenol chemical matricectomy.  Assessment: #1 postop permanent partial nail avulsion lateral border of the right great toe #2 open wound periungual nail fold of respective digit.   Plan of care: #1 patient was evaluated  #2 debridement of open wound was performed to the periungual border of the respective toe using a currette. Antibiotic ointment and Band-Aid was applied. #3 patient is to return to clinic on a PRN  basis.   Felecia Shelling, DPM [facility]  Dr. Felecia Shelling, DPM    [address]                                        [place], [place] [address]                Office [phone]  Fax [phone]

## 2016-12-15 ENCOUNTER — Ambulatory Visit: Payer: Self-pay | Admitting: Family Medicine

## 2016-12-19 MED FILL — GABAPENTIN 300 MG CAPSULE: 300 | 30 days supply | Qty: 30 | Fill #3

## 2016-12-21 MED FILL — TRUEPLUS PEN NDL 31G X 1/4": 31G X 6 MM | 25 days supply | Qty: 100 | Fill #1

## 2016-12-21 MED FILL — TRUEPLUS PEN NDL 31G X 1/4: 31G X 6 MM | 25 days supply | Qty: 100 | Fill #1

## 2016-12-26 ENCOUNTER — Ambulatory Visit: Payer: Self-pay | Attending: Family Medicine | Admitting: Family Medicine

## 2016-12-26 ENCOUNTER — Encounter: Payer: Self-pay | Admitting: Family Medicine

## 2016-12-26 ENCOUNTER — Ambulatory Visit (HOSPITAL_COMMUNITY)
Admission: RE | Admit: 2016-12-26 | Discharge: 2016-12-26 | Disposition: A | Discharge status: Home / Self Care | Payer: Self-pay | Source: Ambulatory Visit | Attending: Family Medicine | Admitting: Family Medicine

## 2016-12-26 VITALS — BP 128/83 | HR 94 | Temp 98.3°F | Resp 18 | Ht 72.0 in | Wt 306.4 lb

## 2016-12-26 DIAGNOSIS — M722 Plantar fascial fibromatosis: Secondary | ICD-10-CM | POA: Insufficient documentation

## 2016-12-26 DIAGNOSIS — M549 Dorsalgia, unspecified: Secondary | ICD-10-CM | POA: Insufficient documentation

## 2016-12-26 DIAGNOSIS — E1142 Type 2 diabetes mellitus with diabetic polyneuropathy: Secondary | ICD-10-CM

## 2016-12-26 DIAGNOSIS — E119 Type 2 diabetes mellitus without complications: Secondary | ICD-10-CM | POA: Insufficient documentation

## 2016-12-26 DIAGNOSIS — M545 Low back pain: Secondary | ICD-10-CM | POA: Insufficient documentation

## 2016-12-26 DIAGNOSIS — Z794 Long term (current) use of insulin: Secondary | ICD-10-CM | POA: Insufficient documentation

## 2016-12-26 DIAGNOSIS — E669 Obesity, unspecified: Secondary | ICD-10-CM | POA: Insufficient documentation

## 2016-12-26 DIAGNOSIS — G8929 Other chronic pain: Secondary | ICD-10-CM | POA: Insufficient documentation

## 2016-12-26 DIAGNOSIS — M25511 Pain in right shoulder: Secondary | ICD-10-CM | POA: Insufficient documentation

## 2016-12-26 DIAGNOSIS — E0800 Diabetes mellitus due to underlying condition with hyperosmolarity without nonketotic hyperglycemic-hyperosmolar coma (NKHHC): Secondary | ICD-10-CM

## 2016-12-26 DIAGNOSIS — Z6841 Body Mass Index (BMI) 40.0 and over, adult: Secondary | ICD-10-CM | POA: Insufficient documentation

## 2016-12-26 DIAGNOSIS — M2578 Osteophyte, vertebrae: Secondary | ICD-10-CM | POA: Insufficient documentation

## 2016-12-26 LAB — GLUCOSE, POCT (MANUAL RESULT ENTRY): POC Glucose: 202 mg/dl — AB (ref 70–99)

## 2016-12-26 MED ORDER — DICLOFENAC SODIUM 75 MG PO TBEC: 75.0000 mg | DELAYED_RELEASE_TABLET | Freq: Two times a day (BID) | ORAL | 1 refills | Status: DC

## 2016-12-26 NOTE — Patient Instructions (Addendum)
Plantar Fasciitis Plantar fasciitis is a painful foot condition that affects the heel. It occurs when the band of tissue that connects the toes to the heel bone (plantar fascia) becomes irritated. This can happen after exercising too much or doing other repetitive activities (overuse injury). The pain from plantar fasciitis can range from mild irritation to severe pain that makes it difficult for you to walk or move. The pain is usually worse in the morning or after you have been sitting or lying down for a while. What are the causes? This condition may be caused by:  Standing for long periods of time.  Wearing shoes that do not fit.  Doing high-impact activities, including running, aerobics, and ballet.  Being overweight.  Having an abnormal way of walking (gait).  Having tight calf muscles.  Having high arches in your feet.  Starting a new athletic activity. What are the signs or symptoms? The main symptom of this condition is heel pain. Other symptoms include:  Pain that gets worse after activity or exercise.  Pain that is worse in the morning or after resting.  Pain that goes away after you walk for a few minutes. How is this diagnosed? This condition may be diagnosed based on your signs and symptoms. Your health care provider will also do a physical exam to check for:  A tender area on the bottom of your foot.  A high arch in your foot.  Pain when you move your foot.  Difficulty moving your foot. You may also need to have imaging studies to confirm the diagnosis. These can include:  X-rays.  Ultrasound.  MRI. How is this treated? Treatment for plantar fasciitis depends on the severity of the condition. Your treatment may include:  Rest, ice, and over-the-counter pain medicines to manage your pain.  Exercises to stretch your calves and your plantar fascia.  A splint that holds your foot in a stretched, upward position while you sleep (night splint).  Physical  therapy to relieve symptoms and prevent problems in the future.  Cortisone injections to relieve severe pain.  Extracorporeal shock wave therapy (ESWT) to stimulate damaged plantar fascia with electrical impulses. It is often used as a last resort before surgery.  Surgery, if other treatments have not worked after 12 months. Follow these instructions at home:  Take medicines only as directed by your health care provider.  Avoid activities that cause pain.  Roll the bottom of your foot over a bag of ice or a bottle of cold water. Do this for 20 minutes, 3-4 times a day.  Perform simple stretches as directed by your health care provider.  Try wearing athletic shoes with air-sole or gel-sole cushions or soft shoe inserts.  Wear a night splint while sleeping, if directed by your health care provider.  Keep all follow-up appointments with your health care provider. How is this prevented?  Do not perform exercises or activities that cause heel pain.  Consider finding low-impact activities if you continue to have problems.  Lose weight if you need to. The best way to prevent plantar fasciitis is to avoid the activities that aggravate your plantar fascia. Contact a health care provider if:  Your symptoms do not go away after treatment with home care measures.  Your pain gets worse.  Your pain affects your ability to move or do your daily activities. This information is not intended to replace advice given to you by your health care provider. Make sure you discuss any questions you have   with your health care provider. Document Released: 04/19/2001 Document Revised: 12/28/2015 Document Reviewed: 06/04/2014 Elsevier Interactive Patient Education  2017 [employer]   Back Exercises If you have pain in your back, do these exercises 2-3 times each day or as told by your doctor. When the pain goes away, do the exercises once each day, but repeat the steps more times for each exercise  (do more repetitions). If you do not have pain in your back, do these exercises once each day or as told by your doctor. Exercises Single Knee to Chest   Do these steps 3-5 times in a row for each leg: 1. Lie on your back on a firm bed or the floor with your legs stretched out. 2. Bring one knee to your chest. 3. Hold your knee to your chest by grabbing your knee or thigh. 4. Pull on your knee until you feel a gentle stretch in your lower back. 5. Keep doing the stretch for 10-30 seconds. 6. Slowly let go of your leg and straighten it. Pelvic Tilt   Do these steps 5-10 times in a row: 1. Lie on your back on a firm bed or the floor with your legs stretched out. 2. Bend your knees so they point up to the ceiling. Your feet should be flat on the floor. 3. Tighten your lower belly (abdomen) muscles to press your lower back against the floor. This will make your tailbone point up to the ceiling instead of pointing down to your feet or the floor. 4. Stay in this position for 5-10 seconds while you gently tighten your muscles and breathe evenly. Cat-Cow   Do these steps until your lower back bends more easily: 1. Get on your hands and knees on a firm surface. Keep your hands under your shoulders, and keep your knees under your hips. You may put padding under your knees. 2. Let your head hang down, and make your tailbone point down to the floor so your lower back is round like the back of a cat. 3. Stay in this position for 5 seconds. 4. Slowly lift your head and make your tailbone point up to the ceiling so your back hangs low (sags) like the back of a cow. 5. Stay in this position for 5 seconds. Press-Ups   Do these steps 5-10 times in a row: 1. Lie on your belly (face-down) on the floor. 2. Place your hands near your head, about shoulder-width apart. 3. While you keep your back relaxed and keep your hips on the floor, slowly straighten your arms to raise the top half of your body and lift  your shoulders. Do not use your back muscles. To make yourself more comfortable, you may change where you place your hands. 4. Stay in this position for 5 seconds. 5. Slowly return to lying flat on the floor. Bridges   Do these steps 10 times in a row: 1. Lie on your back on a firm surface. 2. Bend your knees so they point up to the ceiling. Your feet should be flat on the floor. 3. Tighten your butt muscles and lift your butt off of the floor until your waist is almost as high as your knees. If you do not feel the muscles working in your butt and the back of your thighs, slide your feet 1-2 inches farther away from your butt. 4. Stay in this position for 3-5 seconds. 5. Slowly lower your butt to the floor, and let your butt muscles relax.   If this exercise is too easy, try doing it with your arms crossed over your chest. Belly Crunches   Do these steps 5-10 times in a row: 1. Lie on your back on a firm bed or the floor with your legs stretched out. 2. Bend your knees so they point up to the ceiling. Your feet should be flat on the floor. 3. Cross your arms over your chest. 4. Tip your chin a little bit toward your chest but do not bend your neck. 5. Tighten your belly muscles and slowly raise your chest just enough to lift your shoulder blades a tiny bit off of the floor. 6. Slowly lower your chest and your head to the floor. Back Lifts  Do these steps 5-10 times in a row: 1. Lie on your belly (face-down) with your arms at your sides, and rest your forehead on the floor. 2. Tighten the muscles in your legs and your butt. 3. Slowly lift your chest off of the floor while you keep your hips on the floor. Keep the back of your head in line with the curve in your back. Look at the floor while you do this. 4. Stay in this position for 3-5 seconds. 5. Slowly lower your chest and your face to the floor. Contact a doctor if:  Your back pain gets a lot worse when you do an exercise.  Your back  pain does not lessen 2 hours after you exercise. If you have any of these problems, stop doing the exercises. Do not do them again unless your doctor says it is okay. Get help right away if:  You have sudden, very bad back pain. If this happens, stop doing the exercises. Do not do them again unless your doctor says it is okay. This information is not intended to replace advice given to you by your health care provider. Make sure you discuss any questions you have with your health care provider. Document Released: 08/27/2010 Document Revised: 12/31/2015 Document Reviewed: 09/18/2014 Elsevier Interactive Patient Education  2017 [employer]  

## 2016-12-26 NOTE — Progress Notes (Signed)
Patient is here for f/up back concerns  Patient complains about back pain arm pain  & right foot pain   Patient has not taking his medication for today  Patient has eaten for today

## 2016-12-26 NOTE — Progress Notes (Signed)
Subjective:  Patient ID: [name], male    DOB: 18-Oct-1965  Age: 51 y.o. MRN: [id number]  CC: Establish Care   HPI [name] presents for   Low Back Pain: Patient complains of chronic low back pain. The patient first noted symptoms severalyears ago. It was not related to a known injury.. The pain is rated moderate, and is located at the thoracic back. The pain is described as aching and occurs intermittently. The symptoms reports been progressive. Symptoms are exacerbated by flexion and standing. Factors which relieve the pain include narcotic pain medications, NSAIDs and rest. Other associated symptoms include no other symptoms. Previous history of symptoms: the problem is long-standing.  Treatment efforts have included rest, OTC NSAIDS and prescription NSAIDS, with mild relief of symptoms.  Diabetes Mellitus: Patient presents for follow up of diabetes. Symptoms: paresthesia of the feet. Symptoms have been well-controlled. Patient denies foot ulcerations, nausea, visual disturbances and vomitting.  Evaluation to date has been included: fasting blood sugar, hemoglobin A1C and microalbuminuria.  Home sugars: patient does not check sugars regularly. Treatment to date: Continued insulin which has been effective and Continued metformin which has been effective.   Joint/Muscle Pain: Patient complains of arthralgias for which has been present for 5 months. Pain is located in the right shoulder(s), is described as aching and pressure, and is intermittent .  Associated symptoms include: decreased range of motion.  The patient has tried NSAIDs and narcotics for pain, with moderate relief.  Related to injury: yes. Patient has history of arm fracture with surgical intervention. He reports last follow up with orthopedic specialist was early May. He denies any PT services. He also reports right, plantar foot pain. Reports tightness of plantar foot that decreases with movement throughout the day.      Outpatient Medications Prior to Visit  Medication Sig Dispense Refill  . gabapentin (NEURONTIN) 300 MG capsule Take 1 capsule (300 mg total) by mouth 2 (two) times daily. 60 capsule 2  . glucose blood (TRUE METRIX BLOOD GLUCOSE TEST) test strip Use as instructed 100 each 12  . glucose monitoring kit (FREESTYLE) monitoring kit 1 each by Does not apply route 4 (four) times daily - after meals and at bedtime. 1 month Diabetic Testing Supplies for QAC-QHS accuchecks. 1 each 1  . insulin aspart (NOVOLOG FLEXPEN) 100 UNIT/ML FlexPen 0-15 Units, Subcutaneous, 3 times daily with meals CBG < 70: implement hypoglycemia protocol-call MD CBG 70 - 120: 0 units CBG 121 - 150: 2 units CBG 151 - 200: 3 units CBG 201 - 250: 5 units CBG 251 - 300: 8 units CBG 301 - 350: 11 units CBG 351 - 400: 15 units CBG > 400: 45 mL 3  . insulin glargine (LANTUS) 100 UNIT/ML injection Inject 0.4 mLs (40 Units total) into the skin at bedtime. 10 mL 11  . insulin lispro (HUMALOG KWIKPEN) 100 UNIT/ML KiwkPen Inject 0-0.15 mLs (0-15 Units total) into the skin 3 (three) times daily with meals. (Patient not taking: Reported on 12/01/2016) 45 mL 3  . Insulin Pen Needle 32G X 8 MM MISC Use as directed 100 each 0  . lisinopril (PRINIVIL,ZESTRIL) 10 MG tablet Take 1 tablet (10 mg total) by mouth daily. 90 tablet 0  . metFORMIN (GLUCOPHAGE XR) 500 MG 24 hr tablet Take 2 tablets (1,000 mg total) by mouth 2 (two) times daily with a meal. 120 tablet 3  . TRUEPLUS LANCETS 28G MISC Use as directed 100 each 12   Facility-Administered Medications Prior  to Visit  Medication Dose Route Frequency Provider Last Rate Last Dose  . bacitracin ointment 1 application  1 application Topical Once Lis Savitt R, FNP        ROS Review of Systems  Constitutional: Negative.   Eyes: Negative.   Respiratory: Negative.   Cardiovascular: Negative.   Gastrointestinal: Negative.   Musculoskeletal: Positive for arthralgias and back pain.   Skin: Negative.   Neurological: Negative.   Psychiatric/Behavioral: Negative.     Objective:  BP 128/83 (BP Location: Left Arm, Patient Position: Sitting, Cuff Size: Normal)   Pulse 94   Temp 98.3 F (36.8 C) (Oral)   Resp 18   Ht 6' (1.829 m)   Wt (!) 306 lb 6.4 oz (139 kg)   SpO2 96%   BMI 41.56 kg/m   BP/Weight 12/26/2016 04/04/33 04/08/7914  Systolic BP 056 979 480  Diastolic BP 83 87 84  Wt. (Lbs) 306.4 297.2 296.6  BMI 41.56 40.31 41.37    Physical Exam  Constitutional: He is oriented to person, place, and time. He appears well-developed and well-nourished.  obese  Eyes: Conjunctivae are normal. Pupils are equal, round, and reactive to light.  Neck: No JVD present.  Cardiovascular: Normal rate, regular rhythm, normal heart sounds and intact distal pulses.   Pulmonary/Chest: Effort normal and breath sounds normal.  Abdominal: Soft. Bowel sounds are normal.  Musculoskeletal: Normal range of motion.       Right shoulder: He exhibits pain.       Thoracic back: He exhibits pain.  right arm pain.   Neurological: He is alert and oriented to person, place, and time. He has normal reflexes.  Skin: Skin is warm and dry.  Psychiatric: He has a normal mood and affect.  Nursing note and vitals reviewed.   Assessment & Plan:   Problem List Items Addressed This Visit      Endocrine   Diabetes ([place])   Relevant Orders   Glucose (CBG) (Completed)    Other Visit Diagnoses    Chronic back pain greater than 3 months duration    -  Primary   Relevant Medications   diclofenac (VOLTAREN) 75 MG EC tablet   Other Relevant Orders   Ambulatory referral to Physical Therapy   DG Thoracic Spine 2 View (Completed)   Plantar fasciitis       Recommend supportive shoe insoles.    Relevant Medications   diclofenac (VOLTAREN) 75 MG EC tablet   Other Relevant Orders   Ambulatory referral to Podiatry   Chronic right shoulder pain       Relevant Medications   diclofenac (VOLTAREN) 75  MG EC tablet   Other Relevant Orders   Ambulatory referral to Physical Therapy      Meds ordered this encounter  Medications  . diclofenac (VOLTAREN) 75 MG EC tablet    Sig: Take 1 tablet (75 mg total) by mouth 2 (two) times daily.    Dispense:  60 tablet    Refill:  1    Order Specific Question:   Supervising Provider    Answer:   Tresa Garter [id number]    Follow-up: Return if symptoms worsen or fail to improve.   Alfonse Spruce FNP

## 2016-12-30 ENCOUNTER — Other Ambulatory Visit: Payer: Self-pay | Admitting: Family Medicine

## 2016-12-30 DIAGNOSIS — M2578 Osteophyte, vertebrae: Secondary | ICD-10-CM

## 2017-01-03 ENCOUNTER — Telehealth: Payer: Self-pay

## 2017-01-03 NOTE — Telephone Encounter (Signed)
-----   Message from Lizbeth BarkMandesia R [name], FNP sent at 12/30/2016 11:31 AM EDT ----- Thoracic spine shows no evidence of fracture or dislocation of the spinal column. It does show bone spurs along the spine. You will be referred to orthopedics for follow up.

## 2017-01-03 NOTE — Telephone Encounter (Signed)
CMA call regarding x ray results   Patient Verify DOB   Patient was aware and understood  

## 2017-01-04 ENCOUNTER — Ambulatory Visit: Payer: Self-pay | Attending: Family Medicine

## 2017-01-04 DIAGNOSIS — M545 Low back pain: Secondary | ICD-10-CM | POA: Insufficient documentation

## 2017-01-04 DIAGNOSIS — M25511 Pain in right shoulder: Secondary | ICD-10-CM | POA: Insufficient documentation

## 2017-01-04 DIAGNOSIS — G8929 Other chronic pain: Secondary | ICD-10-CM | POA: Insufficient documentation

## 2017-01-04 DIAGNOSIS — M546 Pain in thoracic spine: Secondary | ICD-10-CM | POA: Insufficient documentation

## 2017-01-04 NOTE — Patient Instructions (Signed)
    Scapular Retraction (Standing)   With arms at sides, pinch shoulder blades together. Hold for 5 seconds. Repeat __10__ times per set. Do __3__ sets per session.     Copyright  VHI. All rights reserved.     

## 2017-01-04 NOTE — Therapy (Signed)
[facility] [address], [place], [address] Phone: [phone]   Fax:  [phone]  Physical Therapy Evaluation  Patient Details  Name: [name] MRN: [id number] Date of Birth: Dec 07, 1965 Referring Provider: Arrie Senate, FNP  Encounter Date: 01/04/2017      PT End of Session - 01/04/17 0808    Visit Number 1   Number of Visits 13   Date for PT Re-Evaluation 02/16/17   PT Start Time 0808   PT Stop Time 0902   PT Time Calculation (min) 54 min   Activity Tolerance Patient tolerated treatment well   Behavior During Therapy [facility] for tasks assessed/performed      Past Medical History:  Diagnosis Date  . Diabetes mellitus without complication (HCC)   . Fracture closed, humerus    right  . Fracture of humeral shaft, right, closed 09/02/2016  . Fracture of humerus, proximal, right, closed 09/03/2016  . Hypertension     Past Surgical History:  Procedure Laterality Date  . CHOLECYSTECTOMY    . ORIF HUMERUS FRACTURE Right 09/02/2016   Procedure: OPEN REDUCTION INTERNAL FIXATION (ORIF) RIGHT  HUMERAL SHAFT FRACTURE;  Surgeon: Myrene Galas, MD;  Location: [facility] OR;  Service: Orthopedics;  Laterality: Right;  . WISDOM TOOTH EXTRACTION      There were no vitals filed for this visit.       Subjective Assessment - 01/04/17 0810    Subjective R shoulder/arm: 3/10 currently (pt sitting), 1-2/10 at best (but pt states having pain medication), 7/10 at worst;  Back pain: 4/10 currently (pt sitting on a chair),  1-2/10 (pt was resting and was in a reclined position, also had tramadol), 8/10 back pain at worst for the past month.    Pertinent History Chronic back and R shoulder pain. Pt states having R shoulder surgery on September 02, 2016 secondary to R shoulder fracture. Has not had PT for his R shoulder. Feels a constant discomfort at his arm (feels like someone is compressing his shoulder such as there is a  screw holding it together). Feels it more at the end of the day. But his R shoulder feels better since his surgery.  Pt also states difficulty raising his R shoulder due to sharp pain in the front and side.  Currently has difficulty reaching with his R arm.   Pt also states having pain in his middle back with constant pain. Had x-rays which revealed bone spurs in his vertebrae. Thinks there area compressed discs going on but has an appointment with his MD to discuss that.   Feels like his back pain has gotten worse since the past year. Pt states that his doctor did not see any fractures.  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   Pt states that his R arm does not feel like its healed but his MD says that it is and is very happy with the X-rays.  Does some exercises with the green or purple elastic band (ER, IR, flexion, extension since the 12th week mark post op). R shoulder is better compared to a month ago.   R hand dominant, L hand writing and eating, and cutting with a knife   Patient Stated Goals Want sort of normal range of motion for his R shoulder.    Currently in Pain? Yes   Pain Score 3   R shoulder pain; 4/10 back pain   Pain Location --  R shoulder  and back   Pain Orientation Right;Mid;Lower   Pain Descriptors / Indicators --  R shoulder: sharp; R arm: compression   Pain Type Chronic pain   Pain Onset More than a month ago   Pain Frequency Constant   Aggravating Factors  Back: leaning forward, walking, standing and cooking, sitting to drive. R shoulder: reaching behind his back, reaching   Pain Relieving Factors Back: leaning back on a chair, lying down on his back. Sometimes leaning forward helps too.   Sitting or laying down.  Rest.             [facility] PT Assessment - 01/04/17 0828      Assessment   Medical Diagnosis Chronic back and R shoulder pain   Referring Provider Arrie Senate, FNP   Onset Date/Surgical Date 09/02/16  ORIF R humeral shaft. Back  pain chronic   Hand Dominance Right   Prior Therapy No known PT for current condition.     Precautions   Precaution Comments No known precautions     Restrictions   Other Position/Activity Restrictions No known restrictions     Balance Screen   Has the patient fallen in the past 6 months No   Has the patient had a decrease in activity level because of a fear of falling?  No   Is the patient reluctant to leave their home because of a fear of falling?  No     Home Environment   Additional Comments Patient lives in a 2 story home with family.  6 steps to enter, bilateral rail.   14 steps inside with L rail.      Prior Function   Vocation --  Chef   Vocation Requirements PLOF: better able to reach with his R arm, better able to stand and cook.    [place]isure [place]movies/cook/guitar     Observation/Other Assessments   Modified [place] 56%   Quick DASH  47.72%     Posture/Postural Control   Posture Comments Bilaterally protracted shoulders and neck, slight R lateral shift. Slight R convexity mid back at around the area of back pain.      AROM   Overall AROM Comments Most reproduction of back pain is flexion and R side bending.    Right Shoulder Flexion 112 Degrees  AAROM 116, stiff end feel; sharp anterior shoulder pain   Right Shoulder ABduction 110 Degrees  116 AAROM, stiff end feel, shoulder joint throbbing   Right Shoulder Internal Rotation --  Functional IR: thumb to L4   Right Shoulder External Rotation --  Functional ER: fingers to T1   Left Shoulder Flexion 146 Degrees   Left Shoulder ABduction 146 Degrees   Left Shoulder Internal Rotation --  functional IR: thumb to around T7   Left Shoulder External Rotation --  functional ER: fingers to around T2   Lumbar Flexion WFL with reproduction of back pain   Lumbar Extension WFL with slight stress feeling in his back    Lumbar - Right Side Bend Limited with reproduction of back pain as well as R rib area pain.    Lumbar -  Left Side Bend Limited with low back discomfort. Very little mid back symptoms    Lumbar - Right Rotation WFL   Lumbar - Left Rotation WFL     Palpation   Palpation comment L paraspinal muscle tension mid back      Objective measurements completed on examination: See above findings.        Objectives  Pt currently 17.5 weeks post op R humeral shaft ORIF (09/02/2016)  Pt states using a green or purple band (color blind) for his shoulder exercises   Paraspinal muscle tension L palpated mid back   There-ex  Standing L shoulder adduction resistng green band 10x5 seconds (easy) to help decrease slight R lateral shift posture   Then with double green band 10x   seated bilateral scapular retraction 10x to promote gentle thoracic extension and help promote proper scapular positioning to decrease R shoulder pain  Reviewed and given as part of his HEP. Pt demonstrated and verbalized understanding.    Improved exercise technique, movement at target joints, use of target muscles after mod verbal, visual, tactile cues.                       PT Education - 01/04/17 1002    Education provided Yes   Education Details ther-ex, HEP, plan of care   Person(s) Educated Patient   Methods Explanation;Demonstration;Tactile cues;Verbal cues   Comprehension Verbalized understanding;Returned demonstration             PT Long Term Goals - 01/04/17 [id number]      PT LONG TERM GOAL #1   Title Patient will improve R shoulder flexion and abduction AROM to at least 135 degrees to promote ability to reach with less pain.    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    Baseline 8/10 back pain at worst for the past month (01/04/2017)   Time 6    Period Weeks   Status New     PT LONG TERM GOAL #3   Title Patient will improve his Quick Dash Disability/Symptom score by at least 15% as a demonstration of improved function.    Baseline 47.72% (01/04/2017)   Time 6   Period Weeks   Status New     PT LONG TERM GOAL #4   Title Patient will improve his Modified [place] Low Back Pain Disability Questionnaire score by at least 12% as a demonstration of improved function.    Baseline 56% (01/04/2017)   Time 6   Period Weeks   Status New                Plan - 01/04/17 0909    Clinical Impression Statement Patient is a 51 year old male who came to physical therapy secondary to R shoulder (S/P ORIF 09/02/2016) and mid back pain.  He also presents with limited R shoulder ROM with stiff end feel, most reproduction of back pain with flexion and R side bending; poor posture, scapular weakness, and difficulty performing functional tasks such as cooking at work, as well as reaching, and picking up items from the floor. Patient will benefit from skilled physical therapy services to address the aforementioned deficits.     History and Personal Factors relevant to plan of care: R humeral shaft fracture, S/P ORIF on 09/02/2016; back pain, finances   Clinical Presentation Stable   Clinical Presentation due to: healing per pt subjective reports   Clinical Decision Making Low  for R arm   Rehab Potential Fair   Clinical Impairments Affecting Rehab Potential Chronicity of  condition, finances/insurance   PT Frequency 2x / week   PT Duration 6 weeks   PT Treatment/Interventions Therapeutic activities;Therapeutic exercise;Patient/family education;Manual techniques;Dry needling;Neuromuscular re-education;Traction;Iontophoresis 4mg /ml Dexamethasone;Electrical Stimulation;Aquatic Therapy  modalities if appropriate; traction for back if appropriate   PT Next Visit Plan scapular strengthening, R shoulder AROM/gentle strengthening as appropriate, manual  techniques, hip and trunk strengthening, posture   Consulted and Agree with Plan of Care Patient      Patient will benefit from skilled therapeutic intervention in order to improve the following deficits and impairments:  Decreased strength, Pain, Postural dysfunction, Improper body mechanics, Decreased range of motion  Visit Diagnosis: Chronic right shoulder pain - Plan: PT plan of care cert/re-cert  Pain in thoracic spine - Plan: PT plan of care cert/re-cert  Chronic low back pain, unspecified back pain laterality, with sciatica presence unspecified - Plan: PT plan of care cert/re-cert     Problem List Patient Active Problem List   Diagnosis Date Noted  . Fracture of humerus, proximal, right, closed 09/03/2016  . Fracture of humeral shaft, right, closed 09/02/2016  . Diabetes (HCC) 09/02/2016  . Essential hypertension, benign 09/02/2016    Loralyn Freshwater PT, DPT   01/04/2017, 10:12 AM  [facility] SPORTS MEDICINE [address], [place], [address] Phone: [phone]   Fax:  [phone]  Name: [name] MRN: [id number] Date of Birth: [birth year]

## 2017-01-11 ENCOUNTER — Telehealth: Payer: Self-pay

## 2017-01-11 ENCOUNTER — Ambulatory Visit: Payer: No Typology Code available for payment source | Attending: Family Medicine

## 2017-01-11 DIAGNOSIS — G8929 Other chronic pain: Secondary | ICD-10-CM | POA: Insufficient documentation

## 2017-01-11 DIAGNOSIS — M25511 Pain in right shoulder: Secondary | ICD-10-CM | POA: Insufficient documentation

## 2017-01-11 DIAGNOSIS — M546 Pain in thoracic spine: Secondary | ICD-10-CM | POA: Insufficient documentation

## 2017-01-11 DIAGNOSIS — M545 Low back pain: Secondary | ICD-10-CM | POA: Insufficient documentation

## 2017-01-11 NOTE — Telephone Encounter (Signed)
No show. Called patient pertaining to today's session and if he wanted to reschedule. Return phone call requested. Phone number [phone]) provided.

## 2017-01-16 ENCOUNTER — Ambulatory Visit (INDEPENDENT_AMBULATORY_CARE_PROVIDER_SITE_OTHER): Payer: No Typology Code available for payment source

## 2017-01-16 ENCOUNTER — Ambulatory Visit (INDEPENDENT_AMBULATORY_CARE_PROVIDER_SITE_OTHER): Payer: Self-pay | Admitting: Orthopedic Surgery

## 2017-01-16 ENCOUNTER — Encounter (INDEPENDENT_AMBULATORY_CARE_PROVIDER_SITE_OTHER): Payer: Self-pay | Admitting: Orthopedic Surgery

## 2017-01-16 DIAGNOSIS — G8929 Other chronic pain: Secondary | ICD-10-CM

## 2017-01-16 DIAGNOSIS — M545 Low back pain, unspecified: Secondary | ICD-10-CM

## 2017-01-17 ENCOUNTER — Ambulatory Visit: Payer: No Typology Code available for payment source

## 2017-01-17 ENCOUNTER — Telehealth: Payer: Self-pay

## 2017-01-17 DIAGNOSIS — G8929 Other chronic pain: Secondary | ICD-10-CM

## 2017-01-17 DIAGNOSIS — M25511 Pain in right shoulder: Principal | ICD-10-CM

## 2017-01-17 NOTE — Patient Instructions (Addendum)
  Walk your fingers up the wall to feel a comfortable stretch at your right shoulder.    Hold for 5 seconds.    Repeat 10 times.   Perform 3 sets daily.      You can repeat this diagonally.

## 2017-01-17 NOTE — Telephone Encounter (Signed)
No show. Called and left a message pertaining to his scheduled appointment and a reminder for his next follow up session. Return phone call requested. Phone number provided [phone]).

## 2017-01-17 NOTE — Therapy (Signed)
[facility] PHYS[facility] AND SPORTS MEDICINE [address] [place], [address] Phone: [phone]   Fax:  [phone]  Physical Therapy Treatment  Patient Details  Name: [name] MRN: [id number] Date of Birth: Apr 01, 1966 Referring Provider: Arrie Senate, FNP  Encounter Date: 01/17/2017      PT End of Session - 01/17/17 0824    Visit Number 2   Number of Visits 13   Date for PT Re-Evaluation 02/16/17   PT Start Time 0824  pt arrived very late   PT Stop Time 0906   PT Time Calculation (min) 42 min   Activity Tolerance Patient tolerated treatment well   Behavior During Therapy [facility] for tasks assessed/performed      Past Medical History:  Diagnosis Date  . Diabetes mellitus without complication (HCC)   . Fracture closed, humerus    right  . Fracture of humeral shaft, right, closed 09/02/2016  . Fracture of humerus, proximal, right, closed 09/03/2016  . Hypertension     Past Surgical History:  Procedure Laterality Date  . CHOLECYSTECTOMY    . ORIF HUMERUS FRACTURE Right 09/02/2016   Procedure: OPEN REDUCTION INTERNAL FIXATION (ORIF) RIGHT  HUMERAL SHAFT FRACTURE;  Surgeon: Myrene Galas, MD;  Location: [facility] OR;  Service: Orthopedics;  Laterality: Right;  . WISDOM TOOTH EXTRACTION      There were no vitals filed for this visit.      Subjective Assessment - 01/17/17 0825    Subjective Had an appointment with his orthopedic surgeon who said that the shoulder is ideal and is basically discharging the pt. Surgeon gave him no instructions or restrictions. Had an appointment with the back doctor (Dr. August Saucer) who said that the space between the vertebrae are small. Might be some degeneration.  He ordered an MRI. The low back looks quite good.   2/10 mid back pain currently, and 3-ish/10 R shoulder pain currently.    Pertinent History Chronic back and R shoulder pain. Pt states having R shoulder surgery on September 02, 2016 secondary to R  shoulder fracture. Has not had PT for his R shoulder. Feels a constant discomfort at his arm (feels like someone is compressing his shoulder such as there is a screw holding it together). Feels it more at the end of the day. But his R shoulder feels better since his surgery.  Pt also states difficulty raising his R shoulder due to sharp pain in the front and side.  Currently has difficulty reaching with his R arm.   Pt also states having pain in his middle back with constant pain. Had x-rays which revealed bone spurs in his vertebrae. Thinks there area compressed discs going on but has an appointment with his MD to discuss that.   Feels like his back pain has gotten worse since the past year. Pt states that his doctor did not see any fractures.  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   Pt states that his R arm does not feel like its healed but his MD says that it is and is very happy with the X-rays.  Does some exercises with the green or purple elastic band (ER, IR, flexion, extension since the 12th week mark post op). R shoulder is better compared to a month ago.   R hand dominant, L hand writing and eating, and cutting with a knife   Patient Stated Goals Want sort of normal range of motion for his R shoulder.  Currently in Pain? Yes   Pain Score 3   R shoulder   Pain Onset More than a month ago                                 PT Education - 01/17/17 0851    Education provided Yes   Education Details ther-ex, HEP   Person(s) Educated Patient   Methods Explanation;Demonstration;Tactile cues;Verbal cues;Handout   Comprehension Verbalized understanding;Returned demonstration       Objectives   Pt states wanting to work on his R shoulder R shoulder AROM: 127 degrees flexion, 111 degrees abduction at start of session   Manual therapy   STM R pectoralis major in supine and sitting (R arm propped in about 80 degrees scaption in sitting) STM R  teres major  AROM: 136 degrees flexion, 122 degrees abduction    There-ex  L shoulder flexion stretch against the wall 5 seconds. Pt states feeling stress at humeral shaft. Held off stretch  L shoulder ER isometrics against wall 10x5 seconds for 2 sets  Finger wall climbs for flexion 10x5 seconds. No stress felt at arm at area of repair.    140 degrees R shoulder flexion AROM afterwards   Finger wall climbs for scaption 10x5 seconds   reviewed HEP. Pt demonstrated and verbalized understanding.   Standing bilateral scapular retraction resisting red band 10x5 seconds to promote thoracic extension and scapular retraction   Improved exercise technique, movement at target joints, use of target muscles after mod verbal, visual, tactile cues.              PT Long Term Goals - 01/04/17 [id number]      PT LONG TERM GOAL #1   Title Patient will improve R shoulder flexion and abduction AROM to at least 135 degrees to promote ability to reach with less pain.    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    Baseline 8/10 back pain at worst for the past month (01/04/2017)   Time 6   Period Weeks   Status New     PT LONG TERM GOAL #3   Title Patient will improve his Quick Dash Disability/Symptom score by at least 15% as a demonstration of improved function.    Baseline 47.72% (01/04/2017)   Time 6   Period Weeks   Status New     PT LONG TERM GOAL #4   Title Patient will improve his Modified [place] Low Back Pain Disability Questionnaire score by at least 12% as a demonstration of improved function.    Baseline 56% (01/04/2017)   Time 6   Period Weeks   Status New               Plan - 01/17/17 [id number]    Clinical Impression Statement  Improved R shoulder flexion and abduction AROM after manual therapy to promote soft tissue mobility to pectoralis major and teres major muscles and shoulder flexion AAROM at wall. Pt tolerated session well without aggravation of R shoulder symptoms.    Clinical Presentation Stable   Clinical Decision Making Low   Rehab Potential Fair  Clinical Impairments Affecting Rehab Potential Chronicity of condition, finances/insurance   PT Frequency 2x / week   PT Duration 6 weeks   PT Treatment/Interventions Therapeutic activities;Therapeutic exercise;Patient/family education;Manual techniques;Dry needling;Neuromuscular re-education;Traction;Iontophoresis 4mg /ml Dexamethasone;Electrical Stimulation;Aquatic Therapy  modalities if appropriate; traction for back if appropriate   PT Next Visit Plan scapular strengthening, R shoulder AROM/gentle strengthening as appropriate, manual techniques, hip and trunk strengthening, posture   Consulted and Agree with Plan of Care Patient      Patient will benefit from skilled therapeutic intervention in order to improve the following deficits and impairments:  Decreased strength, Pain, Postural dysfunction, Improper body mechanics, Decreased range of motion  Visit Diagnosis: Chronic right shoulder pain     Problem List Patient Active Problem List   Diagnosis Date Noted  . Fracture of humerus, proximal, right, closed 09/03/2016  . Fracture of humeral shaft, right, closed 09/02/2016  . Diabetes (HCC) 09/02/2016  . Essential hypertension, benign 09/02/2016   Loralyn Freshwater PT, DPT   01/17/2017, 11:37 AM  [facility] PHYSICAL AND SPORTS MEDICINE [address] [place], [address] Phone: [phone]   Fax:  [phone]  Name: [name] MRN: [id number] Date of Birth: April 19, 1966

## 2017-01-18 ENCOUNTER — Encounter: Payer: Self-pay | Admitting: Neurology

## 2017-01-18 ENCOUNTER — Ambulatory Visit (INDEPENDENT_AMBULATORY_CARE_PROVIDER_SITE_OTHER): Payer: No Typology Code available for payment source | Admitting: Neurology

## 2017-01-18 VITALS — BP 136/86 | HR 110 | Ht 72.0 in | Wt 298.5 lb

## 2017-01-18 DIAGNOSIS — R400 Somnolence: Secondary | ICD-10-CM

## 2017-01-18 DIAGNOSIS — G4761 Periodic limb movement disorder: Secondary | ICD-10-CM

## 2017-01-18 DIAGNOSIS — Z6841 Body Mass Index (BMI) 40.0 and over, adult: Secondary | ICD-10-CM

## 2017-01-18 DIAGNOSIS — G2581 Restless legs syndrome: Secondary | ICD-10-CM

## 2017-01-18 DIAGNOSIS — G4733 Obstructive sleep apnea (adult) (pediatric): Secondary | ICD-10-CM

## 2017-01-18 DIAGNOSIS — R351 Nocturia: Secondary | ICD-10-CM

## 2017-01-18 NOTE — Progress Notes (Signed)
Office Visit Note   Patient: [name]           Date of Birth: 1966/08/04           MRN: [id number] Visit Date: 01/16/2017 Requested by: Lizbeth Bark, FNP [address] [place], [place] [address] PCP: Lizbeth Bark, FNP  Subjective: Chief Complaint  Patient presents with  . Lower Back - Pain    HPI: [name] is a 51 year old patient with back pain.  Denies any history of injury.  He's had pain for several years but it is getting worse.  Denies any other orthopedic complaints in his other joints.  He states that he has increased pain after activity.  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  He recently had a proximal humerus fracture which was treated with plating this year.  The pain does not wake him from sleep.  He denies any numbness and tingling or leg pain or radicular symptoms.  Patient has diabetes and does describe a little bit of neuropathic-type symptoms in both feet.  He states that in the morning his symptoms are the the best and as he is on his feet his back symptoms progress during the day.  Takes ibuprofen for the humerus fracture.  His hemoglobin A1c recently was 6.5.  He does work every day as a Futures trader.              ROS: All systems reviewed are negative as they relate to the chief complaint within the history of present illness.  Patient denies  fevers or chills.   Assessment & Plan: Visit Diagnoses:  1. Chronic midline low back pain without sciatica     Plan: Impression is thoracolumbar back pain with radiographic abnormality in this region.  Differential diagnosis includes atypical focal degenerative disc disease versus discitis versus some other type of pathologic process.  It's an unusual location for focal arthritis.  He does have very reproducible and centralizing pain with no radicular component.  I'll see him back after his MRI scan.  Malignancy is very unlikely but needs to be considered based on the radiographic appearance at the thoracic  lumbar junction  Follow-Up Instructions: Return for after MRI.   Orders:  Orders Placed This Encounter  Procedures  . XR Lumbar Spine 2-3 Views  . MR Lumbar Spine w/o contrast   No orders of the defined types were placed in this encounter.     Procedures: No procedures performed   Clinical Data: No additional findings.  Objective: Vital Signs: There were no vitals taken for this visit.  Physical Exam:   Constitutional: Patient appears well-developed HEENT:  Head: Normocephalic Eyes:EOM are normal Neck: Normal range of motion Cardiovascular: Normal rate Pulmonary/chest: Effort normal Neurologic: Patient is alert Skin: Skin is warm Psychiatric: Patient has normal mood and affect    Ortho Exam: Orthopedic exam demonstrates normal gait alignment 5 out of 5 ankle dorsiflexion plantar flexion upon entering strength palpable pedal pulses negative Babinski negative clonus and strength resisted hip flexion abduction and adduction.  No paresthesias L1 S1 bilaterally.  Specialty Comments:  No specialty comments available.  Imaging: No results found.   PMFS History: Patient Active Problem List   Diagnosis Date Noted  . Fracture of humerus, proximal, right, closed 09/03/2016  . Fracture of humeral shaft, right, closed 09/02/2016  . Diabetes (HCC) 09/02/2016  . Essential hypertension, benign 09/02/2016   Past Medical History:  Diagnosis Date  . Diabetes mellitus without complication (  HCC)   . Fracture closed, humerus    right  . Fracture of humeral shaft, right, closed 09/02/2016  . Fracture of humerus, proximal, right, closed 09/03/2016  . Hypertension     Family History  Problem Relation Age of Onset  . Sjogren's syndrome Mother   . CAD Father   . Diabetes Father     Past Surgical History:  Procedure Laterality Date  . CHOLECYSTECTOMY    . ORIF HUMERUS FRACTURE Right 09/02/2016   Procedure: OPEN REDUCTION INTERNAL FIXATION (ORIF) RIGHT  HUMERAL SHAFT  FRACTURE;  Surgeon: Myrene GalasMichael Handy, MD;  Location: [facility] OR;  Service: Orthopedics;  Laterality: Right;  . WISDOM TOOTH EXTRACTION     Social History   Occupational History  . Not on file.   Social History Main Topics  . Smoking status: Former Smoker    Quit date: 08/09/2011  . Smokeless tobacco: Never Used  . Alcohol use Yes     Comment: 3 drinks per week  . Drug use: No  . Sexual activity: Not on file

## 2017-01-18 NOTE — Patient Instructions (Signed)
Based on your symptoms and your exam I believe you are at risk for obstructive sleep apnea or OSA, and I think we should proceed with a sleep study to determine whether you do or do not have OSA and how severe it is. If you have more than mild OSA, I want you to consider treatment with CPAP. Please remember, the risks and ramifications of moderate to severe obstructive sleep apnea or OSA are: Cardiovascular disease, including congestive heart failure, stroke, difficult to control hypertension, arrhythmias, and even type 2 diabetes has been linked to untreated OSA. Sleep apnea causes disruption of sleep and sleep deprivation in most cases, which, in turn, can cause recurrent headaches, problems with memory, mood, concentration, focus, and vigilance. Most people with untreated sleep apnea report excessive daytime sleepiness, which can affect their ability to drive. Please do not drive if you feel sleepy.   I will likely see you back after your sleep study to go over the test results and where to go from there. We will call you after your sleep study to advise about the results (most likely, you will hear from Lafonda Mossesiana, my nurse) and to set up an appointment at the time, as necessary.    Please remember to try to maintain good sleep hygiene, which means: Keep a regular sleep and wake schedule, try not to exercise or have a meal within 2 hours of your bedtime, try to keep your bedroom conducive for sleep, that is, cool and dark, without light distractors such as an illuminated alarm clock, and refrain from watching TV right before sleep or in the middle of the night and do not keep the TV or radio on during the night. Also, try not to use or play on electronic devices at bedtime, such as your cell phone, tablet PC or laptop. If you like to read at bedtime on an electronic device, try to dim the background light as much as possible. Do not eat in the middle of the night.   Our sleep lab administrative assistant, Alvis LemmingsDawn  will meet with you or call you to schedule your sleep study. If you don't hear back from her by next week please feel free to call her at [phone]. This is her direct line and please leave a message with your phone number to call back if you get the voicemail box. She will call back as soon as possible.

## 2017-01-18 NOTE — Progress Notes (Signed)
Subjective:    Patient ID: Frederic Tones is a 51 y.o. male.  HPI     Star Age, MD, PhD [facility] [address]O. [place], [place] [address]  Dear Toy Baker,   I saw your patient, Beren Yniguez, upon your kind request in my neurologic clinic today for initial consultation of his sleep disorder, in particular, concern for underlying obstructive sleep apnea. The patient is unaccompanied today. As you know, Mr. Guo is a 51 year old right-handed gentleman with an underlying medical history of type 2 diabetes, low back pain, and morbid obesity with a BMI of over 40, who reports snoring and excessive daytime somnolence. I reviewed your office note from 12/01/2016 as well as 12/26/2016. He was recently seen by Dr. Marlou Sa for his low back pain. Lumbar spine MRI was ordered at the time. His Epworth sleepiness score is 15 out of 24, fatigue score is 47 out of 63. He moved from [place] last year. Single, no children, lives with sister and her family. He has been told he has apneas.  Cannot sleep on his back, d/t apneas and gasping and has been told he has apneic pauses. Has nocturia x 1 per night. No AM HAs reported. Has some RLS type symptoms and PLMs were reported. He works as a Biomedical scientist, caters now. Fell in Jan and Fx his R humerus.  He goes to bed around 10 PM but plays on cell phone for 2-3 hours sometimes. He gets up around 7:30. He is trying to lose weight, less exercise d/t back pain.   His Past Medical History Is Significant For: Past Medical History:  Diagnosis Date  . Diabetes mellitus without complication ([place])   . Fracture closed, humerus    right  . Fracture of humeral shaft, right, closed 09/02/2016  . Fracture of humerus, proximal, right, closed 09/03/2016  . Hypertension     His Past Surgical History Is Significant For: Past Surgical History:  Procedure Laterality Date  . CHOLECYSTECTOMY    . ORIF HUMERUS FRACTURE Right 09/02/2016    Procedure: OPEN REDUCTION INTERNAL FIXATION (ORIF) RIGHT  HUMERAL SHAFT FRACTURE;  Surgeon: Altamese Emelle, MD;  Location: [facility];  Service: Orthopedics;  Laterality: Right;  . WISDOM TOOTH EXTRACTION      His Family History Is Significant For: Family History  Problem Relation Age of Onset  . Sjogren's syndrome Mother   . CAD Father   . Diabetes Father     His Social History Is Significant For: Social History   Social History  . Marital status: Single    Spouse name: N/A  . Number of children: N/A  . Years of education: N/A   Social History Main Topics  . Smoking status: Former Smoker    Quit date: 08/09/2011  . Smokeless tobacco: Never Used  . Alcohol use Yes     Comment: 3 drinks per week  . Drug use: No  . Sexual activity: Not Asked   Other Topics Concern  . None   Social History Narrative  . None    His Allergies Are:  Allergies  Allergen Reactions  . No Known Allergies   :   His Current Medications Are:  Outpatient Encounter Prescriptions as of 01/18/2017  Medication Sig  . diclofenac (VOLTAREN) 75 MG EC tablet Take 1 tablet (75 mg total) by mouth 2 (two) times daily.  Marland Kitchen gabapentin (NEURONTIN) 300 MG capsule Take 1 capsule (300 mg total) by mouth 2 (two) times daily.  Marland Kitchen  glucose blood (TRUE METRIX BLOOD GLUCOSE TEST) test strip Use as instructed  . glucose monitoring kit (FREESTYLE) monitoring kit 1 each by Does not apply route 4 (four) times daily - after meals and at bedtime. 1 month Diabetic Testing Supplies for QAC-QHS accuchecks.  . insulin aspart (NOVOLOG FLEXPEN) 100 UNIT/ML FlexPen 0-15 Units, Subcutaneous, 3 times daily with meals CBG < 70: implement hypoglycemia protocol-call MD CBG 70 - 120: 0 units CBG 121 - 150: 2 units CBG 151 - 200: 3 units CBG 201 - 250: 5 units CBG 251 - 300: 8 units CBG 301 - 350: 11 units CBG 351 - 400: 15 units CBG > 400:  . insulin glargine (LANTUS) 100 UNIT/ML injection Inject 0.4 mLs (40 Units total) into the skin at  bedtime.  . insulin lispro (HUMALOG KWIKPEN) 100 UNIT/ML KiwkPen Inject 0-0.15 mLs (0-15 Units total) into the skin 3 (three) times daily with meals.  . Insulin Pen Needle 32G X 8 MM MISC Use as directed  . lisinopril (PRINIVIL,ZESTRIL) 10 MG tablet Take 1 tablet (10 mg total) by mouth daily.  . metFORMIN (GLUCOPHAGE XR) 500 MG 24 hr tablet Take 2 tablets (1,000 mg total) by mouth 2 (two) times daily with a meal.  . TRUEPLUS LANCETS 28G MISC Use as directed   Facility-Administered Encounter Medications as of 01/18/2017  Medication  . bacitracin ointment 1 application  :  Review of Systems:  Out of a complete 14 point review of systems, all are reviewed and negative with the exception of these symptoms as listed below:  Review of Systems  Neurological:       Pt presents today as a new pt with complaints of not being able to sleep. Pt has not had a sleep study before and does say that he snores.   Epworth Sleepiness Scale 0= would never doze 1= slight chance of dozing 2= moderate chance of dozing 3= high chance of dozing  Sitting and reading:2 Watching TV:2 Sitting inactive in a public place (ex. Theater or meeting):1 As a passenger in a car for an hour without a break:3 Lying down to rest in the afternoon:3 Sitting and talking to someone:0-1 Sitting quietly after lunch (no alcohol):2 In a car, while stopped in traffic:1 Total:14-15     Objective:  Neurologic Exam  Physical Exam Physical Examination:   Vitals:   01/18/17 1328  BP: 136/86  Pulse: (!) 110    General Examination: The patient is a very pleasant 51 y.o. male in no acute distress. He appears well-developed and well-nourished and adequately groomed.   HEENT: Normocephalic, atraumatic, pupils are equal, round and reactive to light and accommodation. Extraocular tracking is good without limitation to gaze excursion or nystagmus noted. Normal smooth pursuit is noted. Hearing is grossly intact. Face is symmetric  with normal facial animation and normal facial sensation. Speech is clear with no dysarthria noted. There is no hypophonia. There is no lip, neck/head, jaw or voice tremor. Neck is supple with full range of passive and active motion. There are no carotid bruits on auscultation. Oropharynx exam reveals: moderate mouth dryness, adequate dental hygiene and moderate airway crowding. Mallampati is class III. Tongue protrudes centrally and palate elevates symmetrically. Tonsils are 1+. Neck size is 19 3/8 inches. He has a Mild overbite.    Chest: Clear to auscultation without wheezing, rhonchi or crackles noted.  Heart: S1+S2+0, regular and normal without murmurs, rubs or gallops noted.   Abdomen: Soft, non-tender and non-distended with normal bowel sounds appreciated  on auscultation.  Extremities: There is no pitting edema in the distal lower extremities bilaterally. Pedal pulses are intact.  Skin: Warm and dry without trophic changes noted.  Musculoskeletal: exam reveals no obvious joint deformities, tenderness or joint swelling or erythema.   Neurologically:  Mental status: The patient is awake, alert and oriented in all 4 spheres. His immediate and remote memory, attention, language skills and fund of knowledge are appropriate. There is no evidence of aphasia, agnosia, apraxia or anomia. Speech is clear with normal prosody and enunciation. Thought process is linear. Mood is normal and affect is normal.  Cranial nerves II - XII are as described above under HEENT exam. In addition: shoulder shrug is normal with equal shoulder height noted. Motor exam: Normal bulk, strength and tone is noted. There is no drift, tremor or rebound. Romberg is negative. Reflexes are 2+ throughout. Babinski: Toes are flexor bilaterally. Fine motor skills and coordination: intact with normal finger taps, normal hand movements, normal rapid alternating patting, normal foot taps and normal foot agility.  Cerebellar testing: No  dysmetria or intention tremor on finger to nose testing. Heel to shin is unremarkable bilaterally. There is no truncal or gait ataxia.  Sensory exam: intact to light touch, pinprick, vibration, temperature sense in the upper and lower extremities, but mild decrease to all modalities up to below knees b/l.  Gait, station and balance: He stands with mild difficulty. No veering to one side is noted. No leaning to one side is noted. Posture is age-appropriate and stance is narrow based. Gait shows normal stride length and normal pace. No problems turning are noted. Tandem walk is difficult for him.           Assessment and Plan:   In summary, Wasif Simonich is a very pleasant 51 y.o.-year old male with an underlying medical history of type 2 diabetes, low back pain, and morbid obesity, whose history and physical exam are in keeping with obstructive sleep apnea (OSA). He endorses RLS Sx and PLMs.  I had a long chat with the patient about my findings and the diagnosis of OSA, its prognosis and treatment options. We talked about medical treatments, surgical interventions and non-pharmacological approaches. I explained in particular the risks and ramifications of untreated moderate to severe OSA, especially with respect to developing cardiovascular disease down the Road, including congestive heart failure, difficult to treat hypertension, cardiac arrhythmias, or stroke. Even type 2 diabetes has, in part, been linked to untreated OSA. Symptoms of untreated OSA include daytime sleepiness, memory problems, mood irritability and mood disorder such as depression and anxiety, lack of energy, as well as recurrent headaches, especially morning headaches. We talked about trying to maintain a healthy lifestyle in general, as well as the importance of weight control. I encouraged the patient to eat healthy, exercise daily and keep well hydrated, to keep a scheduled bedtime and wake time routine, to not skip any meals and eat  healthy snacks in between meals. I advised the patient not to drive when feeling sleepy. I recommended the following at this time: sleep study with potential positive airway pressure titration. (We will score hypopneas at 3%).   I explained the sleep test procedure to the patient and also outlined possible surgical and non-surgical treatment options of OSA, including the use of a custom-made dental device (which would require a referral to a specialist dentist or oral surgeon), upper airway surgical options, such as pillar implants, radiofrequency surgery, tongue base surgery, and UPPP (which would involve  a referral to an ENT surgeon). Rarely, jaw surgery such as mandibular advancement may be considered.  I also explained the CPAP treatment option to the patient, who indicated that he would be willing to try CPAP if the need arises. I explained the importance of being compliant with PAP treatment, not only for insurance purposes but primarily to improve His symptoms, and for the patient's long term health benefit, including to reduce His cardiovascular risks. I answered all his questions today and the patient was in agreement. I would like to see him back after the sleep study is completed and encouraged him to call with any interim questions, concerns, problems or updates.   Thank you very much for allowing me to participate in the care of this nice patient. If I can be of any further assistance to you please do not hesitate to call me at [phone].  Sincerely,   Star Age, MD, PhD

## 2017-01-19 ENCOUNTER — Ambulatory Visit: Payer: No Typology Code available for payment source

## 2017-01-19 DIAGNOSIS — M545 Low back pain: Secondary | ICD-10-CM

## 2017-01-19 DIAGNOSIS — G8929 Other chronic pain: Secondary | ICD-10-CM

## 2017-01-19 DIAGNOSIS — M25511 Pain in right shoulder: Principal | ICD-10-CM

## 2017-01-19 DIAGNOSIS — M546 Pain in thoracic spine: Secondary | ICD-10-CM

## 2017-01-19 NOTE — Therapy (Signed)
[facility] [address] [place], [address] Phone: [phone]   Fax:  [phone]  Physical Therapy Treatment  Patient Details  Name: [name] MRN: [id number] Date of Birth: September 01, 1965 Referring Provider: Arrie Senate, FNP  Encounter Date: 01/19/2017      PT End of Session - 01/19/17 0847    Visit Number 3   Number of Visits 13   Date for PT Re-Evaluation 02/16/17   PT Start Time 0847   PT Stop Time 0931   PT Time Calculation (min) 44 min   Activity Tolerance Patient tolerated treatment well   Behavior During Therapy [facility] for tasks assessed/performed      Past Medical History:  Diagnosis Date  . Diabetes mellitus without complication ([facility])   . Fracture closed, humerus    right  . Fracture of humeral shaft, right, closed 09/02/2016  . Fracture of humerus, proximal, right, closed 09/03/2016  . Hypertension     Past Surgical History:  Procedure Laterality Date  . CHOLECYSTECTOMY    . ORIF HUMERUS FRACTURE Right 09/02/2016   Procedure: OPEN REDUCTION INTERNAL FIXATION (ORIF) RIGHT  HUMERAL SHAFT FRACTURE;  Surgeon: Myrene Galas, MD;  Location: [facility] OR;  Service: Orthopedics;  Laterality: Right;  . WISDOM TOOTH EXTRACTION      There were no vitals filed for this visit.      Subjective Assessment - 01/19/17 0848    Subjective Shoulder and back is the same but they're fine. 1-2/10 R arm pain currently, 2-3/10 back pain currently.  Would like to work on his shoulder today.    Pertinent History Chronic back and R shoulder pain. Pt states having R shoulder surgery on September 02, 2016 secondary to R shoulder fracture. Has not had PT for his R shoulder. Feels a constant discomfort at his arm (feels like someone is compressing his shoulder such as there is a screw holding it together). Feels it more at the end of the day. But his R shoulder feels better since his surgery.  Pt also states difficulty  raising his R shoulder due to sharp pain in the front and side.  Currently has difficulty reaching with his R arm.   Pt also states having pain in his middle back with constant pain. Had x-rays which revealed bone spurs in his vertebrae. Thinks there area compressed discs going on but has an appointment with his MD to discuss that.   Feels like his back pain has gotten worse since the past year. Pt states that his doctor did not see any fractures.  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   Pt states that his R arm does not feel like its healed but his MD says that it is and is very happy with the X-rays.  Does some exercises with the green or purple elastic band (ER, IR, flexion, extension since the 12th week mark post op). R shoulder is better compared to a month ago.   R hand dominant, L hand writing and eating, and cutting with a knife   Patient Stated Goals Want sort of normal range of motion for his R shoulder.    Currently in Pain? Yes   Pain Score 2   R shoulder/arm   Pain Onset More than a month ago  PT Education - 01/19/17 0905    Education provided Yes   Education Details ther-ex   Person(s) Educated Patient   Methods Explanation;Demonstration;Tactile cues;Verbal cues   Comprehension Verbalized understanding;Returned demonstration        Objectives   Pt states wanting to work on his R shoulder R shoulder AROM: 127 degrees flexion, 121 degrees abduction at start of session   Manual therapy  STM R pectoralis major in sitting (R arm propped in about 80 degrees scaption ) STM R teres major  AROM: 135 degrees flexion, 127 degrees abduction    There-ex  Supine R shoulder flexion AROM 10x5 seconds for 2 sets Supine R shoulder scaption AROM 10x2 R shoulder ER isometrics against 10x5 seconds for 2 sets Supine open book 10x2 to promote pectoralis muscle flexibility   Standing bilateral scapular  retraction resisting red band 10x5 seconds for 2 sets to promote thoracic extension and scapular retraction   Seated naval ins 10x5 seconds to promote trunk muscle use to help with back pain   Improved exercise technique, movement at target joints, use of target muscles after min to mod verbal, visual, tactile cues.    Improved R shoulder flexion and abduction AROM against gravity following manual therapy and exercises to promote pectoralis major and teres major muscle flexibility. R shoulder AROM improved to 140 degrees flexion, and 134 degrees abduction at end of session.            PT Long Term Goals - 01/04/17 [id number]      PT LONG TERM GOAL #1   Title Patient will improve R shoulder flexion and abduction AROM to at least 135 degrees to promote ability to reach with less pain.    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    Baseline 8/10 back pain at worst for the past month (01/04/2017)   Time 6   Period Weeks   Status New     PT LONG TERM GOAL #3   Title Patient will improve his Quick Dash Disability/Symptom score by at least 15% as a demonstration of improved function.    Baseline 47.72% (01/04/2017)   Time 6   Period Weeks   Status New     PT LONG TERM GOAL #4   Title Patient will improve his Modified [place] Low Back Pain Disability Questionnaire score by at least 12% as a demonstration of improved function.    Baseline 56% (01/04/2017)   Time 6   Period Weeks   Status New               Plan - 01/19/17 0847    Clinical Impression Statement Improved R shoulder flexion and abduction AROM against gravity following manual therapy and exercises to promote pectoralis major and teres major muscle flexibility. R shoulder AROM  improved to 140 degrees flexion, and 134 degrees abduction at end of session.    Clinical Presentation Stable   Clinical Decision Making Low   Rehab Potential Fair   Clinical Impairments Affecting Rehab Potential Chronicity of condition, finances/insurance   PT Frequency 2x / week   PT Duration 6 weeks   PT Treatment/Interventions Therapeutic activities;Therapeutic exercise;Patient/family education;Manual techniques;Dry needling;Neuromuscular re-education;Traction;Iontophoresis 4mg /ml Dexamethasone;Electrical  Stimulation;Aquatic Therapy  modalities if appropriate; traction for back if appropriate   PT Next Visit Plan scapular strengthening, R shoulder AROM/gentle strengthening as appropriate, manual techniques, hip and trunk strengthening, posture   Consulted and Agree with Plan of Care Patient      Patient will benefit from skilled therapeutic intervention in order to improve the following deficits and impairments:  Decreased strength, Pain, Postural dysfunction, Improper body mechanics, Decreased range of motion  Visit Diagnosis: Chronic right shoulder pain  Pain in thoracic spine  Chronic low back pain, unspecified back pain laterality, with sciatica presence unspecified     Problem List Patient Active Problem List   Diagnosis Date Noted  . Fracture of humerus, proximal, right, closed 09/03/2016  . Fracture of humeral shaft, right, closed 09/02/2016  . Diabetes (HCC) 09/02/2016  . Essential hypertension, benign 09/02/2016    Loralyn Freshwater PT, DPT   01/19/2017, 8:50 PM  [facility] PH[facility] AND SPORTS MEDICINE [address] [place], [address] Phone: [phone]   Fax:  [phone]  Name: Damauri Minion MRN: [id number] Date of Birth: September 23, 1965

## 2017-01-19 NOTE — Patient Instructions (Addendum)
  Gently pull your belly button in.    Hold for 5 seconds (breathe).    Repeat throughout the day.

## 2017-01-23 ENCOUNTER — Ambulatory Visit: Payer: No Typology Code available for payment source

## 2017-01-23 DIAGNOSIS — G8929 Other chronic pain: Secondary | ICD-10-CM

## 2017-01-23 DIAGNOSIS — M25511 Pain in right shoulder: Principal | ICD-10-CM

## 2017-01-23 MED FILL — GABAPENTIN 300 MG CAPSULE: 300 | 30 days supply | Qty: 30 | Fill #4

## 2017-01-23 MED FILL — TRUEPLUS PEN NDL 31G X 1/4: 31G X 6 MM | 25 days supply | Qty: 100 | Fill #2

## 2017-01-23 MED FILL — LISINOPRIL 10 MG TABLET: 10 | 30 days supply | Qty: 30 | Fill #0

## 2017-01-23 MED FILL — TRUEPLUS PEN NDL 31G X 1/4": 31G X 6 MM | 25 days supply | Qty: 100 | Fill #2

## 2017-01-23 NOTE — Therapy (Signed)
[place] [facility] [address] [place], [address] Phone: [phone]   Fax:  [phone]  Physical Therapy Treatment  Patient Details  Name: [name] MRN: [id number] Date of Birth: 1966-07-17 Referring Provider: Arrie Senate, FNP  Encounter Date: 01/23/2017      PT End of Session - 01/23/17 0805    Visit Number 4   Number of Visits 13   Date for PT Re-Evaluation 02/16/17   PT Start Time 0805   PT Stop Time 0850   PT Time Calculation (min) 45 min   Activity Tolerance Patient tolerated treatment well   Behavior During Therapy [facility] for tasks assessed/performed      Past Medical History:  Diagnosis Date  . Diabetes mellitus without complication (HCC)   . Fracture closed, humerus    right  . Fracture of humeral shaft, right, closed 09/02/2016  . Fracture of humerus, proximal, right, closed 09/03/2016  . Hypertension     Past Surgical History:  Procedure Laterality Date  . CHOLECYSTECTOMY    . ORIF HUMERUS FRACTURE Right 09/02/2016   Procedure: OPEN REDUCTION INTERNAL FIXATION (ORIF) RIGHT  HUMERAL SHAFT FRACTURE;  Surgeon: Myrene Galas, MD;  Location: [facility] OR;  Service: Orthopedics;  Laterality: Right;  . WISDOM TOOTH EXTRACTION      There were no vitals filed for this visit.      Subjective Assessment - 01/23/17 0807    Subjective R shoulder is not bad. Really really stiff. I feel like I am getting better every day. Laid on his L side yesterday and felt a sharp pain that disappeared quickly. 1/10 R shoulder currently. Back is 2-3/10 currently; really stiff. Did not do much yesterday so it's kind of relaxed.    Pertinent History Chronic back and R shoulder pain. Pt states having R shoulder surgery on September 02, 2016 secondary to R shoulder fracture. Has not had PT for his R shoulder. Feels a constant discomfort at his arm (feels like someone is compressing his shoulder such as there is a screw  holding it together). Feels it more at the end of the day. But his R shoulder feels better since his surgery.  Pt also states difficulty raising his R shoulder due to sharp pain in the front and side.  Currently has difficulty reaching with his R arm.   Pt also states having pain in his middle back with constant pain. Had x-rays which revealed bone spurs in his vertebrae. Thinks there area compressed discs going on but has an appointment with his MD to discuss that.   Feels like his back pain has gotten worse since the past year. Pt states that his doctor did not see any fractures.  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   Pt states that his R arm does not feel like its healed but his MD says that it is and is very happy with the X-rays.  Does some exercises with the green or purple elastic band (ER, IR, flexion, extension since the 12th week mark post op). R shoulder is better compared to a month ago.   R hand dominant, L hand writing and eating, and cutting with a knife   Patient Stated Goals Want sort of normal range of motion for his R shoulder.    Currently in Pain? Yes   Pain Score --  1/10 R shoulder, 2-3/10 back   Pain Onset More than a month ago  PT Education - 01/23/17 0830    Education provided Yes   Education Details ther-ex   [employer]) Educated Patient   Methods Explanation;Demonstration;Tactile cues;Verbal cues   Comprehension Verbalized understanding;Returned demonstration        Objectives   Pt states wanting to work on his R shoulder R shoulder AROM: 132 degrees flexion, 116 degrees abduction ("tight") at start of session   Manual therapy   STM R distal pectoralis major in sitting (R arm propped in about 80 degrees scaption ) STM R teres major Latissimus dorsi R side  AROM: 135 degrees flexion, 118 degrees abduction     There-ex   Standing UE ranger: scaption 10x2  Flexion  10x  Standing bilateral scapular retraction resisting red band 10x5 seconds for 2 sets to promote thoracic extension and scapular retraction Supine R shoulder flexion AROM 10x5 seconds for 2 sets Supine R shoulder scaption AROM 10x2 Supine open book 10x2 with 5 second holds to promote pectoralis muscle flexibility    Improved exercise technique, movement at target joints, use of target muscles after min to mod verbal, visual, tactile cues.    Good carry over of increased R shoulder flexion AROM since last session. Slightly more limited abduction AROM at start of session today. R shoulder abduction AROM improved to 125 degrees after session. R shoulder 135 degrees AROM after session.        PT Long Term Goals - 01/04/17 9562      PT LONG TERM GOAL #1   Title Patient will improve R shoulder flexion and abduction AROM to at least 135 degrees to promote ability to reach with less pain.    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    Baseline 8/10 back pain at worst for the past month (01/04/2017)   Time 6   Period Weeks   Status New     PT LONG TERM GOAL #3   Title Patient will improve his Quick Dash Disability/Symptom score by at least 15% as a demonstration of improved function.    Baseline 47.72% (01/04/2017)   Time 6   Period Weeks   Status New     PT LONG TERM GOAL #4   Title Patient will improve his Modified [place] Low Back Pain Disability Questionnaire score by at least 12% as a demonstration of improved function.    Baseline 56% (01/04/2017)   Time 6   Period Weeks   Status New               Plan - 01/23/17 0831    Clinical Impression Statement Good carry over of increased R shoulder flexion AROM since last session. Slightly  more limited abduction AROM at start of session today. R shoulder abduction AROM improved to 125 degrees after session. R shoulder 135 degrees AROM after session.    Clinical Presentation Stable   Clinical Decision Making Low   Rehab Potential Fair   Clinical Impairments Affecting Rehab Potential Chronicity of condition, finances/insurance   PT Frequency 2x / week   PT Duration 6 weeks   PT Treatment/Interventions Therapeutic activities;Therapeutic exercise;Patient/family education;Manual techniques;Dry needling;Neuromuscular re-education;Traction;Iontophoresis 4mg /ml Dexamethasone;Electrical Stimulation;Aquatic Therapy  modalities if appropriate; traction for back if  appropriate   PT Next Visit Plan scapular strengthening, R shoulder AROM/gentle strengthening as appropriate, manual techniques, hip and trunk strengthening, posture   Consulted and Agree with Plan of Care Patient      Patient will benefit from skilled therapeutic intervention in order to improve the following deficits and impairments:  Decreased strength, Pain, Postural dysfunction, Improper body mechanics, Decreased range of motion  Visit Diagnosis: Chronic right shoulder pain     Problem List Patient Active Problem List   Diagnosis Date Noted  . Fracture of humerus, proximal, right, closed 09/03/2016  . Fracture of humeral shaft, right, closed 09/02/2016  . Diabetes (HCC) 09/02/2016  . Essential hypertension, benign 09/02/2016    Loralyn FreshwaterMiguel Login Muckleroy PT, DPT   01/23/2017, 9:53 AM   [facility] [address] [place], [place], [address] Phone: [phone]   Fax:  [phone]  Name: Shaun KittenRobert Gendron MRN: [id number] Date of Birth: 1966-04-16

## 2017-01-25 ENCOUNTER — Ambulatory Visit: Payer: No Typology Code available for payment source

## 2017-01-25 DIAGNOSIS — G8929 Other chronic pain: Secondary | ICD-10-CM

## 2017-01-25 DIAGNOSIS — M25511 Pain in right shoulder: Principal | ICD-10-CM

## 2017-01-25 NOTE — Patient Instructions (Signed)
Gave seated L hip extension isometrics as part of his HEP 10x3 with 5 second holds daily. Pt demonstrated and verbalized understanding.

## 2017-01-25 NOTE — Therapy (Signed)
[place] [facility] [address] [place], [address] Phone: [phone]   Fax:  [phone]  Physical Therapy Treatment  Patient Details  Name: [name] MRN: [id number] Date of Birth: 04-17-1966 Referring Provider: Arrie Senate, FNP  Encounter Date: 01/25/2017      PT End of Session - 01/25/17 0804    Visit Number 5   Number of Visits 13   Date for PT Re-Evaluation 02/16/17   PT Start Time 0805   PT Stop Time 0847   PT Time Calculation (min) 42 min   Activity Tolerance Patient tolerated treatment well   Behavior During Therapy [facility] for tasks assessed/performed      Past Medical History:  Diagnosis Date  . Diabetes mellitus without complication (HCC)   . Fracture closed, humerus    right  . Fracture of humeral shaft, right, closed 09/02/2016  . Fracture of humerus, proximal, right, closed 09/03/2016  . Hypertension     Past Surgical History:  Procedure Laterality Date  . CHOLECYSTECTOMY    . ORIF HUMERUS FRACTURE Right 09/02/2016   Procedure: OPEN REDUCTION INTERNAL FIXATION (ORIF) RIGHT  HUMERAL SHAFT FRACTURE;  Surgeon: Myrene Galas, MD;  Location: [facility] OR;  Service: Orthopedics;  Laterality: Right;  . WISDOM TOOTH EXTRACTION      There were no vitals filed for this visit.      Subjective Assessment - 01/25/17 0806    Subjective R shoulder is not bad. 1-2/10 R shouder and arm. Feels a little clicky at his tendon in his R arm. Has been bothering him during the weekend. Still does the ER band exercise.   Overall feels a little bit better every day. Able to reach behind his back and tie his apron. Woke up on his R side this weekend.    Pertinent History Chronic back and R shoulder pain. Pt states having R shoulder surgery on September 02, 2016 secondary to R shoulder fracture. Has not had PT for his R shoulder. Feels a constant discomfort at his arm (feels like someone is compressing his shoulder such  as there is a screw holding it together). Feels it more at the end of the day. But his R shoulder feels better since his surgery.  Pt also states difficulty raising his R shoulder due to sharp pain in the front and side.  Currently has difficulty reaching with his R arm.   Pt also states having pain in his middle back with constant pain. Had x-rays which revealed bone spurs in his vertebrae. Thinks there area compressed discs going on but has an appointment with his MD to discuss that.   Feels like his back pain has gotten worse since the past year. Pt states that his doctor did not see any fractures.  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   Pt states that his R arm does not feel like its healed but his MD says that it is and is very happy with the X-rays.  Does some exercises with the green or purple elastic band (ER, IR, flexion, extension since the 12th week mark post op). R shoulder is better compared to a month ago.   R hand dominant, L hand writing and eating, and cutting with a knife   Patient Stated Goals Want sort of normal range of motion for his R shoulder.    Currently in Pain? Yes   Pain Score 2   1-2/10 R shoulder and  arm when he moves it.    Pain Onset More than a month ago                                 PT Education - 01/25/17 0824    Education provided Yes   Education Details ther-ex   [employer]) Educated Patient   Methods Explanation;Demonstration;Tactile cues;Verbal cues   Comprehension Returned demonstration;Verbalized understanding        Objectives   20.8 weeks post op R shoulder AROM: 128 degrees flexion (feels shoulder tightness), 115 degrees abduction  at start of session Pt states that when he leans forward and then going to extension, pt feels his back symptoms.    Manual therapy    sitting (R arm propped in about 80 degrees scaption ) STM R teres major Latissimus dorsi R side  AROM: 136degrees flexion,  123degrees abduction     There-ex  Pt was recommended to hold off on the ER band exercise that he was doing at home. Pt verbalized understanding,   Standing UE ranger for AAROM: scaption 10x2             Flexion 10x2   Seated (for low back comfort) bilateral scapular retraction resisting red band 10x5 seconds for 2 sets to promote thoracic extension and scapular retraction  Mid to low back discomfort  For back: Standing L shoulder adduction resisting blue band 10x5 seconds for 2 sets to help counter R lateral shift Standing L shoulder extension resisting blue band 10x5 seconds for 2 sets  Seated L hip extension isometrics 10x5 seconds (slight decreased R lateral lean in sitting when performing exercise) for 2 sets    Improved exercise technique, movement at target joints, use of target muscles after min to mod verbal, visual, tactile cues.   Improved R shoulder flexion AROM following manual therapy to promote terres major and latissimus dorsi muscle flexibility.  Worked on L shoulder AAROM to promote ability to raise his R arm up against gravity. Worked on decreasing R lateral lean and improving glute strength to help with back symptoms.                PT Long Term Goals - 01/04/17 1610      PT LONG TERM GOAL #1   Title Patient will improve R shoulder flexion and abduction AROM to at least 135 degrees to promote ability to reach with less pain.    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    Baseline 8/10 back pain at worst for the past month (01/04/2017)   Time 6   Period Weeks   Status New     PT LONG TERM GOAL #3   Title Patient will improve his Quick Dash Disability/Symptom score by at least 15% as a demonstration of  improved function.    Baseline 47.72% (01/04/2017)   Time 6   Period Weeks   Status New     PT LONG TERM GOAL #4   Title Patient will improve his Modified Osw[place] Low Back Pain Disability Questionnaire score by at least 12% as a demonstration of improved function.  Baseline 56% (01/04/2017)   Time 6   Period Weeks   Status New               Plan - 01/25/17 [id number]    Clinical Impression Statement Improved R shoulder flexion AROM following manual therapy to promote terres major and latissimus dorsi muscle flexibility.  Worked on L shoulder AAROM to promote ability to raise his R arm up against gravity. Worked on decreasing R lateral lean and improving glute strength to help with back symptoms.    Clinical Presentation Stable   Clinical Decision Making Low   Rehab Potential Fair   Clinical Impairments Affecting Rehab Potential Chronicity of condition, finances/insurance   PT Frequency 2x / week   PT Duration 6 weeks   PT Treatment/Interventions Therapeutic activities;Therapeutic exercise;Patient/family education;Manual techniques;Dry needling;Neuromuscular re-education;Traction;Iontophoresis 4mg /ml Dexamethasone;Electrical Stimulation;Aquatic Therapy  modalities if appropriate; traction for back if appropriate   PT Next Visit Plan scapular strengthening, R shoulder AROM/gentle strengthening as appropriate, manual techniques, hip and trunk strengthening, posture   Consulted and Agree with Plan of Care Patient      Patient will benefit from skilled therapeutic intervention in order to improve the following deficits and impairments:  Decreased strength, Pain, Postural dysfunction, Improper body mechanics, Decreased range of motion  Visit Diagnosis: Chronic right shoulder pain     Problem List Patient Active Problem List   Diagnosis Date Noted  . Fracture of humerus, proximal, right, closed 09/03/2016  . Fracture of humeral shaft, right, closed 09/02/2016  . Diabetes (HCC)  09/02/2016  . Essential hypertension, benign 09/02/2016   Loralyn FreshwaterMiguel Bienvenido Proehl PT, DPT   01/25/2017, 11:25 AM  [place] [facility] [address] [place], [place], [address] Phone: [phone]   Fax:  [phone]  Name: Shaun KittenRobert Ayala MRN: [id number] Date of Birth: 02-22-1966

## 2017-01-27 ENCOUNTER — Ambulatory Visit (INDEPENDENT_AMBULATORY_CARE_PROVIDER_SITE_OTHER): Payer: Self-pay

## 2017-01-27 ENCOUNTER — Ambulatory Visit (INDEPENDENT_AMBULATORY_CARE_PROVIDER_SITE_OTHER): Payer: Self-pay | Admitting: Podiatry

## 2017-01-27 ENCOUNTER — Ambulatory Visit
Admission: RE | Admit: 2017-01-27 | Discharge: 2017-01-27 | Disposition: A | Discharge status: Home / Self Care | Payer: No Typology Code available for payment source | Source: Ambulatory Visit | Attending: Orthopedic Surgery | Admitting: Orthopedic Surgery

## 2017-01-27 DIAGNOSIS — M79671 Pain in right foot: Secondary | ICD-10-CM

## 2017-01-27 DIAGNOSIS — M545 Low back pain: Principal | ICD-10-CM

## 2017-01-27 DIAGNOSIS — M722 Plantar fascial fibromatosis: Secondary | ICD-10-CM

## 2017-01-27 DIAGNOSIS — G8929 Other chronic pain: Secondary | ICD-10-CM

## 2017-01-27 DIAGNOSIS — M79672 Pain in left foot: Secondary | ICD-10-CM

## 2017-01-30 ENCOUNTER — Ambulatory Visit (INDEPENDENT_AMBULATORY_CARE_PROVIDER_SITE_OTHER): Payer: Self-pay | Admitting: Neurology

## 2017-01-30 ENCOUNTER — Ambulatory Visit: Payer: No Typology Code available for payment source

## 2017-01-30 DIAGNOSIS — G8929 Other chronic pain: Secondary | ICD-10-CM

## 2017-01-30 DIAGNOSIS — M25511 Pain in right shoulder: Principal | ICD-10-CM

## 2017-01-30 DIAGNOSIS — G4720 Circadian rhythm sleep disorder, unspecified type: Secondary | ICD-10-CM

## 2017-01-30 DIAGNOSIS — G4733 Obstructive sleep apnea (adult) (pediatric): Secondary | ICD-10-CM

## 2017-01-30 NOTE — Therapy (Signed)
[facility] [address] [place], [place], [address] Phone: [phone]   Fax:  [phone]  Physical Therapy Treatment  Patient Details  Name: Shaun KittenRobert Ayala MRN: [id number] Date of Birth: 07-Mar-1966 Referring Provider: Arrie SenateMandesia [name], FNP  Encounter Date: 01/30/2017      PT End of Session - 01/30/17 0806    Visit Number 6   Number of Visits 13   Date for PT Re-Evaluation 02/16/17   PT Start Time 0806   PT Stop Time 0848   PT Time Calculation (min) 42 min   Activity Tolerance Patient tolerated treatment well   Behavior During Therapy [facility] for tasks assessed/performed      Past Medical History:  Diagnosis Date  . Diabetes mellitus without complication (HCC)   . Fracture closed, humerus    right  . Fracture of humeral shaft, right, closed 09/02/2016  . Fracture of humerus, proximal, right, closed 09/03/2016  . Hypertension     Past Surgical History:  Procedure Laterality Date  . CHOLECYSTECTOMY    . ORIF HUMERUS FRACTURE Right 09/02/2016   Procedure: OPEN REDUCTION INTERNAL FIXATION (ORIF) RIGHT  HUMERAL SHAFT FRACTURE;  Surgeon: Myrene GalasMichael Handy, MD;  [facility];  Service: Orthopedics;  Laterality: Right;  . WISDOM TOOTH EXTRACTION      There were no vitals filed for this visit.      Subjective Assessment - 01/30/17 0807    Subjective R shoulder is not bad. 1/10 R arm pain currently. Reaching and raising his R arm is getting better each weak. Less sharp pain. Back is not bad. Had a really casual day yesterday so the back is not doing bad. Wants to focus on his R shoulder.    Pertinent History Chronic back and R shoulder pain. Pt states having R shoulder surgery on September 02, 2016 secondary to R shoulder fracture. Has not had PT for his R shoulder. Feels a constant discomfort at his arm (feels like someone is compressing his shoulder such as there is a screw holding it together). Feels it more at the  end of the day. But his R shoulder feels better since his surgery.  Pt also states difficulty raising his R shoulder due to sharp pain in the front and side.  Currently has difficulty reaching with his R arm.   Pt also states having pain in his middle back with constant pain. Had x-rays which revealed bone spurs in his vertebrae. Thinks there area compressed discs going on but has an appointment with his MD to discuss that.   Feels like his back pain has gotten worse since the past year. Pt states that his doctor did not see any fractures.  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   Pt states that his R arm does not feel like its healed but his MD says that it is and is very happy with the X-rays.  Does some exercises with the green or purple elastic band (ER, IR, flexion, extension since the 12th week mark post op). R shoulder is better compared to a month ago.   R hand dominant, L hand writing and eating, and cutting with a knife   Patient Stated Goals Want sort of normal range of motion for his R shoulder.    Currently in Pain? Yes   Pain Score 1    Pain Onset More than a month ago  PT Education - 01/30/17 0825    Education provided Yes   Education Details ther-ex   Person(s) Educated Patient   Methods Explanation;Demonstration;Tactile cues;Verbal cues   Comprehension Returned demonstration;Verbalized understanding        Objectives   21 weeks post op R shoulder AROM: 123degrees flexion (feels shoulder tightness), 117degrees abduction  at start of session     Manual therapy    sitting (R arm propped in about 80 degrees scaption ) STM R teres major Latissimus dorsi R side  AROM: 137degrees flexion, 122degrees abduction     There-ex   Standing ball rolls up the wall for flexion 10x5 seconds,      For scaption 10x5 seconds    Ball wall circles 30 seconds clockwise, 30 seconds  counterclockwise  At 90 degrees flexion  At 90 degrees scaption  Seated R shoulder lower trap raise over table 10x2, head resting on forearm.   Supine shoulder ER with hands behind head 5x3  Standing UE ranger for AAROM: Flexion 10x2     scaption 10x2     Improved exercise technique, movement at target joints, use of target muscles after mod verbal, visual, tactile cues.    Continued working on improving R shoulder AROM to promote ability to reach and perform functional tasks. 137 degrees flexion, 130 degrees abduction AROM after session. Pt tolerated session well without aggravation of symptoms.              PT Long Term Goals - 01/04/17 1610      PT LONG TERM GOAL #1   Title Patient will improve R shoulder flexion and abduction AROM to at least 135 degrees to promote ability to reach with less pain.    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    Baseline 8/10 back pain at worst for the past month (01/04/2017)   Time 6   Period Weeks   Status New     PT LONG TERM GOAL #3   Title Patient will improve his Quick Dash Disability/Symptom score by at least 15% as a demonstration of improved function.    Baseline 47.72% (01/04/2017)   Time 6   Period Weeks   Status New     PT LONG TERM GOAL #4   Title Patient will improve his Modified Oswester[place] Low Back Pain Disability Questionnaire score by at least 12% as a demonstration of improved function.    Baseline 56% (01/04/2017)   Time 6   Period Weeks   Status New               Plan - 01/30/17 9604    Clinical Impression Statement Continued working on improving R shoulder AROM to promote ability to reach and perform functional tasks. 137 degrees flexion, 130 degrees  abduction AROM after session. Pt tolerated session well without aggravation of symptoms.     Clinical Presentation Stable   Clinical Presentation due to: decreasing R shoulder and arm pain, improving ease of reaching per pt reports   Clinical Decision Making Low   Rehab Potential Fair   Clinical Impairments Affecting Rehab Potential Chronicity of condition, finances/insurance   PT Frequency 2x / week   PT  Duration 6 weeks   PT Treatment/Interventions Therapeutic activities;Therapeutic exercise;Patient/family education;Manual techniques;Dry needling;Neuromuscular re-education;Traction;Iontophoresis 4mg /ml Dexamethasone;Electrical Stimulation;Aquatic Therapy  modalities if appropriate; traction for back if appropriate   PT Next Visit Plan scapular strengthening, R shoulder AROM/gentle strengthening as appropriate, manual techniques, hip and trunk strengthening, posture   Consulted and Agree with Plan of Care Patient      Patient will benefit from skilled therapeutic intervention in order to improve the following deficits and impairments:  Decreased strength, Pain, Postural dysfunction, Improper body mechanics, Decreased range of motion  Visit Diagnosis: Chronic right shoulder pain     Problem List Patient Active Problem List   Diagnosis Date Noted  . Fracture of humerus, proximal, right, closed 09/03/2016  . Fracture of humeral shaft, right, closed 09/02/2016  . Diabetes (HCC) 09/02/2016  . Essential hypertension, benign 09/02/2016    Loralyn Freshwater PT, DPT   01/30/2017, 8:58 AM  [place] [facility] PHYSICAL AND SPORTS MEDICINE [address] [place], [address] Phone: [phone]   Fax:  [phone]  Name: [name] MRN: [id number] Date of Birth: [birth year]

## 2017-01-31 NOTE — Progress Notes (Signed)
   Subjective: Patient presents today for pain and tenderness in bilateral arches, right worse than left, that has been ongoing for the past year. 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 Patient presents today for further treatment and evaluation  Objective: Physical Exam General: The patient is alert and oriented x3 in no acute distress.  Dermatology: Skin is warm, dry and supple bilateral lower extremities. Negative for open lesions or macerations bilateral.   Vascular: Dorsalis Pedis and Posterior Tibial pulses palpable bilateral.  Capillary fill time is immediate to all digits.  Neurological: Epicritic and protective threshold intact bilateral.   Musculoskeletal: Tenderness to palpation at the medial calcaneal tubercale and through the insertion of the plantar fascia of the right foot. All other joints range of motion within normal limits bilateral. Strength 5/5 in all groups bilateral.   Radiographic exam: Normal osseous mineralization. Joint spaces preserved. No fracture/dislocation/boney destruction. Calcaneal spur present with mild thickening of plantar fascia right. No other soft tissue abnormalities or radiopaque foreign bodies.   Assessment: 1. Plantar fasciitis right 2. Pain in right foot 3. General foot fatigue  Plan of Care:  1. Patient evaluated. Xrays reviewed.   2. Injection of 0.5cc Celestone soluspan injected into the right heel at the insertion of the plantar fascia.  3. Recommended good walking shoes. 4. Discuss custom molded orthotics. 5. Return to clinic when necessary when ready for orthotics.    Patient is a Investment banker, operationalchef.   Felecia ShellingBrent M. Mauro Arps, DPM [facility]  Dr. Felecia ShellingBrent M. Yamilka Lopiccolo, DPM    [address]                                        [place], [place] [address]                Office [phone]  Fax [phone]

## 2017-02-01 ENCOUNTER — Ambulatory Visit: Payer: No Typology Code available for payment source

## 2017-02-01 DIAGNOSIS — M25511 Pain in right shoulder: Principal | ICD-10-CM

## 2017-02-01 DIAGNOSIS — G8929 Other chronic pain: Secondary | ICD-10-CM

## 2017-02-01 NOTE — Therapy (Signed)
[place] [facility]IC[facility] [address] [place], [address] Phone: [phone]   Fax:  [phone]  Physical Therapy Treatment  Patient Details  Name: [name] MRN: [id number] Date of Birth: 09-Dec-1965 Referring Provider: Arrie Senate, FNP  Encounter Date: 02/01/2017      PT End of Session - 02/01/17 0804    Visit Number 7   Number of Visits 13   Date for PT Re-Evaluation 02/16/17   PT Start Time 0804   PT Stop Time 0850   PT Time Calculation (min) 46 min   Activity Tolerance Patient tolerated treatment well   Behavior During Therapy [facility] for tasks assessed/performed      Past Medical History:  Diagnosis Date  . Diabetes mellitus without complication (HCC)   . Fracture closed, humerus    right  . Fracture of humeral shaft, right, closed 09/02/2016  . Fracture of humerus, proximal, right, closed 09/03/2016  . Hypertension     Past Surgical History:  Procedure Laterality Date  . CHOLECYSTECTOMY    . ORIF HUMERUS FRACTURE Right 09/02/2016   Procedure: OPEN REDUCTION INTERNAL FIXATION (ORIF) RIGHT  HUMERAL SHAFT FRACTURE;  Surgeon: Myrene Galas, MD;  Location: [facility] OR;  Service: Orthopedics;  Laterality: Right;  . WISDOM TOOTH EXTRACTION      There were no vitals filed for this visit.      Subjective Assessment - 02/01/17 0805    Subjective R shoulder and arm is not bad. I can feel it getting better every day. 1/10 R arm currently.  Back is not too bad. 1-2/10. Pretty good.  Wants to focus on his R UE today.  Better able to reach behind his back to tie his apron.   Pertinent History Chronic back and R shoulder pain. Pt states having R shoulder surgery on September 02, 2016 secondary to R shoulder fracture. Has not had PT for his R shoulder. Feels a constant discomfort at his arm (feels like someone is compressing his shoulder such as there is a screw holding it together). Feels it more at the end of the day.  But his R shoulder feels better since his surgery.  Pt also states difficulty raising his R shoulder due to sharp pain in the front and side.  Currently has difficulty reaching with his R arm.   Pt also states having pain in his middle back with constant pain. Had x-rays which revealed bone spurs in his vertebrae. Thinks there area compressed discs going on but has an appointment with his MD to discuss that.   Feels like his back pain has gotten worse since the past year. Pt states that his doctor did not see any fractures.  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   Pt states that his R arm does not feel like its healed but his MD says that it is and is very happy with the X-rays.  Does some exercises with the green or purple elastic band (ER, IR, flexion, extension since the 12th week mark post op). R shoulder is better compared to a month ago.   R hand dominant, L hand writing and eating, and cutting with a knife   Patient Stated Goals Want sort of normal range of motion for his R shoulder.    Currently in Pain? Yes   Pain Score 1    Pain Onset More than a month ago  PT Education - 02/01/17 0827    Education provided Yes   Education Details ther-ex   Person(s) Educated Patient   Methods Explanation;Demonstration;Tactile cues;Verbal cues   Comprehension Verbalized understanding;Returned demonstration        Objectives   21 weeks post op R shoulder AROM: 127degrees flexion (feels shoulder tightness), 121 degrees abduction at start of session     Manual therapy   Sitting (R arm propped in about 80 degrees scaption ): STM R teres major Latissimus dorsi R side R distal pectoralis major muscle area    AROM: 131degrees flexion, 125degrees abduction     There-ex  Standing R biceps flexion 2 lbs 10x3 Standing ball rolls up the wall for flexion 10x5 seconds,                                                   For scaption 10x5 seconds    Ball wall circles 30 seconds clockwise, 30 seconds counterclockwise             At 90 degrees flexion             At 90 degrees scaption  L S/L R shoulder ER 10x3 resisting 1 lb  Supine R shoulder flexion AROM 10x2 with 5 second holds   R shoulder scaption AROM 10x with 5 second holds   Seated R shoulder lower trap raise over table 10x2, head resting on forearm.    Improved exercise technique, movement at target joints, use of target muscles after min to mod verbal, visual, tactile cues.    138 degrees flexion, 120 degrees abduction AROM after session.  Improved R shoulder flexion AROM after session. Added light weights to promote gentle strengthening of R UE. Pt tolerated session well without aggravation of symptoms. Pt also demonstrating improving functional use of his R UE based on subjective reports.            PT Long Term Goals - 01/04/17 [id number]      PT LONG TERM GOAL #1   Title Patient will improve R shoulder flexion and abduction AROM to at least 135 degrees to promote ability to reach with less pain.    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    Baseline 8/10 back pain at worst for the past month (01/04/2017)   Time 6   Period Weeks   Status New     PT LONG TERM GOAL #3   Title Patient will improve his Quick Dash Disability/Symptom score by at least 15% as a demonstration of improved function.    Baseline 47.72% (01/04/2017)   Time 6   Period Weeks   Status New     PT LONG TERM GOAL #4   Title Patient will improve his Modified Oswestery Low Back Pain Disability Questionnaire score by at least 12% as a demonstration of improved function.    Baseline 56% (01/04/2017)   Time 6    Period Weeks   Status New  Plan - 02/01/17 0827    Clinical Impression Statement 138 degrees flexion, 120 degrees abduction AROM after session.  Improved R shoulder flexion AROM after session. Added light weights to promote gentle strengthening of R UE. Pt tolerated session well without aggravation of symptoms. Pt also demonstrating improving functional use of his R UE based on subjective reports.    Clinical Presentation Stable   Clinical Decision Making Low   Rehab Potential Fair   Clinical Impairments Affecting Rehab Potential Chronicity of condition, finances/insurance   PT Frequency 2x / week   PT Duration 6 weeks   PT Treatment/Interventions Therapeutic activities;Therapeutic exercise;Patient/family education;Manual techniques;Dry needling;Neuromuscular re-education;Traction;Iontophoresis 4mg /ml Dexamethasone;Electrical Stimulation;Aquatic Therapy  modalities if appropriate; traction for back if appropriate   PT Next Visit Plan scapular strengthening, R shoulder AROM/gentle strengthening as appropriate, manual techniques, hip and trunk strengthening, posture   Consulted and Agree with Plan of Care Patient      Patient will benefit from skilled therapeutic intervention in order to improve the following deficits and impairments:  Decreased strength, Pain, Postural dysfunction, Improper body mechanics, Decreased range of motion  Visit Diagnosis: Chronic right shoulder pain     Problem List Patient Active Problem List   Diagnosis Date Noted  . Fracture of humerus, proximal, right, closed 09/03/2016  . Fracture of humeral shaft, right, closed 09/02/2016  . Diabetes (HCC) 09/02/2016  . Essential hypertension, benign 09/02/2016    Loralyn Freshwater PT, DPT   02/01/2017, 11:08 AM  [place] [facility] PHYSICAL AND SPORTS MEDICINE [address] [place], [address] Phone: [phone]   Fax:  [phone]  Name: [name] MRN: [id number] Date of Birth: Mar 23, 1966

## 2017-02-01 NOTE — Patient Instructions (Signed)
  On your back:    Raise your right arm up and back as far as you can comfortably.    Hold for 5 seconds.    Repeat 10 times.    Perform 3 sets daily.       You can also perform this movement diagonally.

## 2017-02-02 MED ORDER — BETAMETHASONE SOD PHOS & ACET 6 (3-3) MG/ML IJ SUSP: 3.0000 mg | Freq: Once | INTRAMUSCULAR | Status: DC

## 2017-02-03 ENCOUNTER — Encounter (INDEPENDENT_AMBULATORY_CARE_PROVIDER_SITE_OTHER): Payer: Self-pay | Admitting: Orthopedic Surgery

## 2017-02-03 ENCOUNTER — Ambulatory Visit (INDEPENDENT_AMBULATORY_CARE_PROVIDER_SITE_OTHER): Payer: Self-pay | Admitting: Orthopedic Surgery

## 2017-02-03 DIAGNOSIS — G8929 Other chronic pain: Secondary | ICD-10-CM

## 2017-02-03 DIAGNOSIS — M545 Low back pain: Secondary | ICD-10-CM

## 2017-02-03 LAB — CBC WITH DIFFERENTIAL/PLATELET
BASOS PCT: 0 %
Basophils Absolute: 0 cells/uL (ref 0–200)
Eosinophils Absolute: 212 cells/uL (ref 15–500)
Eosinophils Relative: 2 %
HCT: 45.6 % (ref 38.5–50.0)
Hemoglobin: 14.5 g/dL (ref 13.2–17.1)
LYMPHS PCT: 19 %
Lymphs Abs: 2014 cells/uL (ref 850–3900)
MCH: 28 pg (ref 27.0–33.0)
MCHC: 31.8 g/dL — AB (ref 32.0–36.0)
MCV: 88 fL (ref 80.0–100.0)
MONOS PCT: 7 %
MPV: 9.8 fL (ref 7.5–12.5)
Monocytes Absolute: 742 cells/uL (ref 200–950)
NEUTROS PCT: 72 %
Neutro Abs: 7632 cells/uL (ref 1500–7800)
PLATELETS: 247 10*3/uL (ref 140–400)
RBC: 5.18 MIL/uL (ref 4.20–5.80)
RDW: 15.6 % — AB (ref 11.0–15.0)
WBC: 10.6 10*3/uL (ref 3.8–10.8)

## 2017-02-03 NOTE — Progress Notes (Signed)
Office Visit Note   Patient: [name]           Date of Birth: 03/19/1966           MRN: [id number] Visit Date: 02/03/2017 Requested by: Lizbeth BarkHairston, Mandesia R, FNP [address] , [place] [address] PCP: Lizbeth BarkHairston, Mandesia R, FNP  Subjective: Chief Complaint  Patient presents with  . 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  He denies any leg symptoms but only reports pain in his back.  When he gets up in the morning he generally is doing well but by the end of the day he is in fairly incapacitating pain.  Pain does get worse as the day progresses.  MRI scan is reviewed with the patient.  He does have some facet arthritis at L4-5.  Also generally diffuse bone marrow changes but nothing really clear-cut lytic or concerning for cancer.  Nonetheless we will obtain a CBC with differential and call him with results.  The only definitive pain generator in his back is the L4-5 facet arthritis              ROS: All systems reviewed are negative as they relate to the chief complaint within the history of present illness.  Patient denies  fevers or chills.   Assessment & Plan: Visit Diagnoses:  1. Chronic low back pain, unspecified back pain laterality, with sciatica presence unspecified     Plan: Impression is low back pain with less radicular symptoms today.  Plan is CBC differential just to make sure there is no abnormal blood work presents because of this diffuse signal change in the lumbar spine.  This looks like changes that I have seen in other patients who have obesity or they were smokers.  Also want to send him to Dr. Alvester MorinNewton for lumbar spine ESI.  I'll call him with the results of his lab work.  Follow-Up Instructions: No Follow-up on file.   Orders:  Orders Placed This Encounter  Procedures  . CBC with Differential  . Ambulatory referral to Physical Medicine Rehab   No orders of the defined types were placed in this  encounter.     Procedures: No procedures performed   Clinical Data: No additional findings.  Objective: Vital Signs: There were no vitals taken for this visit.  Physical Exam:   Constitutional: Patient appears well-developed HEENT:  Head: Normocephalic Eyes:EOM are normal Neck: Normal range of motion Cardiovascular: Normal rate Pulmonary/chest: Effort normal Neurologic: Patient is alert Skin: Skin is warm Psychiatric: Patient has normal mood and affect    Ortho Exam: Orthopedic examination is pretty unchanged from prior visit.  Not much in way of nerve root tension signs today.  Does have a little bit of stiffness when he gets up from the seated position.  Both feet are perfused and sensate.  Mild tenderness to palpation along the lower lumbar spine area.  Otherwise unremarkable exam which is unchanged from prior visit  Specialty Comments:  No specialty comments available.  Imaging: No results found.   PMFS History: Patient Active Problem List   Diagnosis Date Noted  . Fracture of humerus, proximal, right, closed 09/03/2016  . Fracture of humeral shaft, right, closed 09/02/2016  . Diabetes (HCC) 09/02/2016  . Essential hypertension, benign 09/02/2016   Past Medical History:  Diagnosis Date  . Diabetes mellitus without complication (HCC)   . Fracture closed, humerus    right  .  Fracture of humeral shaft, right, closed 09/02/2016  . Fracture of humerus, proximal, right, closed 09/03/2016  . Hypertension     Family History  Problem Relation Age of Onset  . Sjogren's syndrome Mother   . CAD Father   . Diabetes Father     Past Surgical History:  Procedure Laterality Date  . CHOLECYSTECTOMY    . ORIF HUMERUS FRACTURE Right 09/02/2016   Procedure: OPEN REDUCTION INTERNAL FIXATION (ORIF) RIGHT  HUMERAL SHAFT FRACTURE;  Surgeon: Myrene Galas, MD;  [facility] OR;  Service: Orthopedics;  Laterality: Right;  . WISDOM TOOTH EXTRACTION     Social History    Occupational History  . Not on file.   Social History Main Topics  . Smoking status: Former Smoker    Quit date: 08/09/2011  . Smokeless tobacco: Never Used  . Alcohol use Yes     Comment: 3 drinks per week  . Drug use: No  . Sexual activity: Not on file

## 2017-02-06 ENCOUNTER — Ambulatory Visit: Payer: No Typology Code available for payment source | Attending: Family Medicine

## 2017-02-06 DIAGNOSIS — M546 Pain in thoracic spine: Secondary | ICD-10-CM | POA: Insufficient documentation

## 2017-02-06 DIAGNOSIS — M545 Low back pain: Secondary | ICD-10-CM | POA: Insufficient documentation

## 2017-02-06 DIAGNOSIS — M25511 Pain in right shoulder: Secondary | ICD-10-CM | POA: Insufficient documentation

## 2017-02-06 DIAGNOSIS — G8929 Other chronic pain: Secondary | ICD-10-CM | POA: Insufficient documentation

## 2017-02-06 NOTE — Therapy (Signed)
[place] [facility]IC[facility] [address], [place], [address] Phone: [phone]   Fax:  [phone]  Physical Therapy Treatment  Patient Details  Name: [name] MRN: [id number] Date of Birth: 1965/10/20 Referring Provider: Arrie Senate, FNP  Encounter Date: 02/06/2017      PT End of Session - 02/06/17 0805    Visit Number 8   Number of Visits 13   Date for PT Re-Evaluation 02/16/17   PT Start Time 0805   PT Stop Time 0849   PT Time Calculation (min) 44 min   Activity Tolerance Patient tolerated treatment well   Behavior During Therapy [facility] for tasks assessed/performed      Past Medical History:  Diagnosis Date  . Diabetes mellitus without complication (HCC)   . Fracture closed, humerus    right  . Fracture of humeral shaft, right, closed 09/02/2016  . Fracture of humerus, proximal, right, closed 09/03/2016  . Hypertension     Past Surgical History:  Procedure Laterality Date  . CHOLECYSTECTOMY    . ORIF HUMERUS FRACTURE Right 09/02/2016   Procedure: OPEN REDUCTION INTERNAL FIXATION (ORIF) RIGHT  HUMERAL SHAFT FRACTURE;  Surgeon: Myrene Galas, MD;  Location: [facility] OR;  Service: Orthopedics;  Laterality: Right;  . WISDOM TOOTH EXTRACTION      There were no vitals filed for this visit.      Subjective Assessment - 02/06/17 0806    Subjective R shoulder is not bad. 1/10 R shoulder pain currently. Very low. Back is not too bad. 2/10.  Saw the doctor last week. Thinks he is going to do an injection into the thoracic area.    Pertinent History Chronic back and R shoulder pain. Pt states having R shoulder surgery on September 02, 2016 secondary to R shoulder fracture. Has not had PT for his R shoulder. Feels a constant discomfort at his arm (feels like someone is compressing his shoulder such as there is a screw holding it together). Feels it more at the end of the day. But his R shoulder feels better since his  surgery.  Pt also states difficulty raising his R shoulder due to sharp pain in the front and side.  Currently has difficulty reaching with his R arm.   Pt also states having pain in his middle back with constant pain. Had x-rays which revealed bone spurs in his vertebrae. Thinks there area compressed discs going on but has an appointment with his MD to discuss that.   Feels like his back pain has gotten worse since the past year. Pt states that his doctor did not see any fractures.  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   Pt states that his R arm does not feel like its healed but his MD says that it is and is very happy with the X-rays.  Does some exercises with the green or purple elastic band (ER, IR, flexion, extension since the 12th week mark post op). R shoulder is better compared to a month ago.   R hand dominant, L hand writing and eating, and cutting with a knife   Patient Stated Goals Want sort of normal range of motion for his R shoulder.    Currently in Pain? Yes   Pain Score 1    Pain Onset More than a month ago  PT Education - 02/06/17 0827    Education provided Yes   Education Details ther-ex   Person(s) Educated Patient   Methods Explanation;Demonstration;Tactile cues;Verbal cues   Comprehension Returned demonstration;Verbalized understanding        Objectives   22weeks post op R shoulder AROM: 135degrees flexion (feels shoulder tightness), 124 degrees abduction at start of session     Manual therapy   Sitting (R arm propped in about 80 degrees scaption ): STM R teres major Latissimus dorsi R side R distal pectoralis major muscle area    AROM: 137degrees flexion, 130degrees abduction     There-ex   Supine shoulder ER with hands behind head 10 seconds x 3  Then 5x3   L S/L R shoulder ER 10x2 resisting 1 lb  Then 2 lbs 10x  Standing R biceps flexion 2 lbs  10x  Then 3 lbs 10x2  Standing ball rolls up the wall for flexion 10x5 seconds,  For scaption 10x5 seconds    1 kg Ball wall circles 30 seconds clockwise, 30 seconds counterclockwise At 90 degrees flexion At 90 degrees scaption  Improved exercise technique, movement at target joints, use of target muscles after min to mod verbal, visual, tactile cues.    Improved starting shoulder flexion and abduction AROM at start of session compared to previous sessions. Pt steadily improving ability to raise his R arm up against gravity. 140 degrees R shoulder flexion, 128 degrees abduction AROM against gravity at end of session. Pt tolerated session well without aggravation of symptoms.          PT Long Term Goals - 01/04/17 [id number]      PT LONG TERM GOAL #1   Title Patient will improve R shoulder flexion and abduction AROM to at least 135 degrees to promote ability to reach with less pain.    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    Baseline 8/10 back pain at worst for the past month (01/04/2017)   Time 6   Period Weeks   Status New     PT LONG TERM GOAL #3   Title Patient will improve his Quick Dash Disability/Symptom score by at least 15% as a demonstration of improved function.    Baseline 47.72% (01/04/2017)   Time 6   Period Weeks   Status New     PT LONG TERM GOAL #4   Title Patient will improve his Modified Osw[place] Low Back Pain Disability Questionnaire score by at least 12% as a demonstration of improved function.    Baseline 56% (01/04/2017)   Time 6   Period Weeks   Status New               Plan - 02/06/17 0827    Clinical Impression Statement Improved  starting shoulder flexion and abduction AROM at start of session compared to previous sessions. Pt steadily improving ability to raise his R arm up against gravity. 140 degrees R shoulder flexion, 128 degrees abduction AROM against gravity at end of session. Pt tolerated session well without aggravation of symptoms.    Clinical Presentation Stable   Clinical Decision Making Low   Rehab Potential Fair   Clinical Impairments  Affecting Rehab Potential Chronicity of condition, finances/insurance   PT Frequency 2x / week   PT Duration 6 weeks   PT Treatment/Interventions Therapeutic activities;Therapeutic exercise;Patient/family education;Manual techniques;Dry needling;Neuromuscular re-education;Traction;Iontophoresis 4mg /ml Dexamethasone;Electrical Stimulation;Aquatic Therapy  modalities if appropriate; traction for back if appropriate   PT Next Visit Plan scapular strengthening, R shoulder AROM/gentle strengthening as appropriate, manual techniques, hip and trunk strengthening, posture   Consulted and Agree with Plan of Care Patient      Patient will benefit from skilled therapeutic intervention in order to improve the following deficits and impairments:  Decreased strength, Pain, Postural dysfunction, Improper body mechanics, Decreased range of motion  Visit Diagnosis: Chronic right shoulder pain     Problem List Patient Active Problem List   Diagnosis Date Noted  . Fracture of humerus, proximal, right, closed 09/03/2016  . Fracture of humeral shaft, right, closed 09/02/2016  . Diabetes (HCC) 09/02/2016  . Essential hypertension, benign 09/02/2016    Loralyn Freshwater PT, DPT   02/06/2017, 10:20 AM  [place] [facility] PHYSICAL AND SPORTS MEDICINE [address] [place], [address] Phone: [phone]   Fax:  [phone]  Name: [name] MRN: [id number] Date of Birth: 12-27-1965

## 2017-02-07 NOTE — Progress Notes (Signed)
Labs ok pls clal thx

## 2017-02-09 ENCOUNTER — Ambulatory Visit: Payer: No Typology Code available for payment source

## 2017-02-09 DIAGNOSIS — M546 Pain in thoracic spine: Secondary | ICD-10-CM

## 2017-02-09 DIAGNOSIS — G8929 Other chronic pain: Secondary | ICD-10-CM

## 2017-02-09 DIAGNOSIS — M25511 Pain in right shoulder: Principal | ICD-10-CM

## 2017-02-09 DIAGNOSIS — M545 Low back pain: Secondary | ICD-10-CM

## 2017-02-09 NOTE — Therapy (Signed)
[facility] PHYSICAL AND SPORTS MEDICINE [address] [place], [place], [address] Phone: [phone]   Fax:  [phone]  Physical Therapy Treatment  Patient Details  Name: Shaun KittenRobert Ayala MRN: [id number] Date of Birth: [birth year] Referring Provider: Arrie SenateMandesia [name], FNP  Encounter Date: 02/09/2017      PT End of Session - 02/09/17 0805    Visit Number 9   Number of Visits 13   Date for PT Re-Evaluation 02/16/17   PT Start Time 0805   PT Stop Time 0839   PT Time Calculation (min) 34 min   Activity Tolerance Patient tolerated treatment well   [facility] tasks assessed/performed      Past Medical History:  Diagnosis Date  . Diabetes mellitus without complication (HCC)   . Fracture closed, humerus    right  . Fracture of humeral shaft, right, closed 09/02/2016  . Fracture of humerus, proximal, right, closed 09/03/2016  . Hypertension     Past Surgical History:  Procedure Laterality Date  . CHOLECYSTECTOMY    . ORIF HUMERUS FRACTURE Right 09/02/2016   Procedure: OPEN REDUCTION INTERNAL FIXATION (ORIF) RIGHT  HUMERAL SHAFT FRACTURE;  Surgeon: Myrene GalasMichael Handy, MD;  [facility];  Service: Orthopedics;  Laterality: Right;  . WISDOM TOOTH EXTRACTION      There were no vitals filed for this visit.      Subjective Assessment - 02/09/17 0806    Subjective R shoulder is not bad. A little pain... 2/10. Back is a bad day... 5-6/10 currently. Shoulder feels loose. Laid down to sleep on his L side, and pushed with his R hand and it felt loose. Carried heavy stuff with his arms the other day.   Carried 2 heavy air conditioners yesterday for 2 flights of stairs.  The back feels like a deep muscle ache.  Also feels lightheaded at times since yesterday.   Did not take his lisonopril last night.  Took it this morning. Did not eat breakfast.   Room does not feel like it is spinning. Dizziness not reproduced with cervical movements   Pertinent History Chronic back and R shoulder pain. Pt states having R shoulder surgery on September 02, 2016 secondary to R shoulder fracture. Has not had PT for his R shoulder. Feels a constant discomfort at his arm (feels like someone is compressing his shoulder such as there is a screw holding it together). Feels it more at the end of the day. But his R shoulder feels better since his surgery.  Pt also states difficulty raising his R shoulder due to sharp pain in the front and side.  Currently has difficulty reaching with his R arm.   Pt also states having pain in his middle back with constant pain. Had x-rays which revealed bone spurs in his vertebrae. Thinks there area compressed discs going on but has an appointment with his MD to discuss that.   Feels like his back pain has gotten worse since the past year. Pt states that his doctor did not see any fractures.  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   Pt states that his R arm does not feel like its healed but his MD says that it is and is very happy with the X-rays.  Does some exercises with the green or purple elastic band (ER, IR, flexion, extension since the 12th week mark post op). R shoulder is better compared to a month ago.   R  hand dominant, L hand writing and eating, and cutting with a knife   Patient Stated Goals Want sort of normal range of motion for his R shoulder.    Pain Score 6   back   Pain Onset More than a month ago                                 PT Education - 02/09/17 0853    Education provided Yes   Education Details eating before PT, contact MD if dizziness worsens   Person(s) Educated Patient   Methods Explanation;Demonstration;Tactile cues;Verbal cues   Comprehension Verbalized understanding        Objectives     Did not eat breakfast  Carried 2 heavy air conditioners yesterday for 2 flights of stairs.  134 degrees R shoulder flexion AROM with a feeling of ball and  socket looseness. Also feels a little click with shoulder abduction for the past few weeks  126 degrees R shoulder abduction AROM at start of session.  Room does not feel like it is spinning. Dizziness not reproduced with cervical movements    Manual therapy    Blood pressure, L arm sitting, mechanically taken: 148/77, HR 96. Took lisinopril this morning.   Sitting (R arm propped in about 80 degrees scaption ): STM R teres major Latissimus dorsi R side R distal pectoralis major muscle area    AROM: 140degrees flexion, 125degrees abduction         There-ex  Recommended for pt to perform ER isometrics at wall 10x3 with 5 second holds at a pain-free level of effort after eating. Pt demonstrated and verbalized understanding  Pt was also recommended to lean back onto his chair to promote thoracic extension (feels better for his back). Pt demonstrated and verbalized understanding.   Only manual therapy performed today secondary to pt not eating breakfast, hx of diabetes, and feeling of lightheadedness and dizziness. Pt was recommended to contact his MD if his symptoms worsen. Pt verbalized understanding. Improved R shoulder flexion AROM after soft tissue mobilization.            PT Long Term Goals - 01/04/17 1610      PT LONG TERM GOAL #1   Title Patient will improve R shoulder flexion and abduction AROM to at least 135 degrees to promote ability to reach with less pain.    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    Baseline 8/10 back pain at worst for the past month (01/04/2017)   Time 6   Period Weeks   Status New     PT LONG TERM GOAL #3   Title Patient will improve his Quick Dash Disability/Symptom score by at least 15%  as a demonstration of improved function.    Baseline 47.72% (01/04/2017)   Time 6   Period Weeks   Status New     PT LONG TERM GOAL #4   Title Patient will improve his Modified [place] Low Back Pain Disability Questionnaire score by at least 12% as a demonstration of improved function.    Baseline 56% (01/04/2017)   Time 6  Period Weeks   Status New               Plan - 02/09/17 0854    Clinical Impression Statement Only manual therapy performed today secondary to pt not eating breakfast, hx of diabetes, and feeling of lightheadedness and dizziness. Pt was recommended to contact his MD if his symptoms worsen. Pt verbalized understanding. Improved R shoulder flexion AROM after soft tissue mobilization.    History and Personal Factors relevant to plan of care: R humeral shaft fracture, S/P ORIF, back pain, finances   Clinical Presentation Evolving   Clinical Presentation due to: feeling of looseness at R shoulder joint, increased back pain after carrying 2 air conditioners 2 flights of stairs July 4   Clinical Decision Making Low   Rehab Potential Fair   Clinical Impairments Affecting Rehab Potential Chronicity of condition, finances/insurance   PT Frequency 2x / week   PT Duration 6 weeks   PT Treatment/Interventions Therapeutic activities;Therapeutic exercise;Patient/family education;Manual techniques;Dry needling;Neuromuscular re-education;Traction;Iontophoresis 4mg /ml Dexamethasone;Electrical Stimulation;Aquatic Therapy  modalities if appropriate; traction for back if appropriate   PT Next Visit Plan scapular strengthening, R shoulder AROM/gentle strengthening as appropriate, manual techniques, hip and trunk strengthening, posture   Consulted and Agree with Plan of Care Patient      Patient will benefit from skilled therapeutic intervention in order to improve the following deficits and impairments:  Decreased strength, Pain, Postural dysfunction, Improper body mechanics,  Decreased range of motion  Visit Diagnosis: Chronic right shoulder pain  Pain in thoracic spine  Chronic low back pain, unspecified back pain laterality, with sciatica presence unspecified     Problem List Patient Active Problem List   Diagnosis Date Noted  . Fracture of humerus, proximal, right, closed 09/03/2016  . Fracture of humeral shaft, right, closed 09/02/2016  . Diabetes (HCC) 09/02/2016  . Essential hypertension, benign 09/02/2016    Loralyn Freshwater PT, DPT   02/09/2017, 10:15 AM  [facility] PHYSICAL AND SPORTS MEDICINE [address] [place], [address] Phone: [phone]   Fax:  [phone]  Name: [name] MRN: [id number] Date of Birth: November 03, 1965

## 2017-02-10 NOTE — Procedures (Signed)
PATIENT'S NAME:  [name], [name] DOB:      [birth year]      MR#:    [id number]     DATE OF RECORDING: 01/30/2017 REFERRING M.D.:  Arrie Senate, FNP Study Performed:  Split-Night Titration Study HISTORY: 51 year old man with a history of type 2 diabetes, low back pain, and morbid obesity with a BMI of over 40, who reports snoring and excessive daytime somnolence. The patient endorsed the Epworth Sleepiness Scale at 15/24 points. The patient's weight 298 pounds with a height of 72 (inches), resulting in a BMI of 40.3 kg/m2. The patient's neck circumference measured 19.5 inches.  CURRENT MEDICATIONS: Diclofenac, Gabapentin, Lisinopril, Metformin  PROCEDURE:  This is a multichannel digital polysomnogram utilizing the Somnostar 11.2 system.  Electrodes and sensors were applied and monitored per AASM Specifications.   EEG, EOG, Chin and Limb EMG, were sampled at 200 Hz.  ECG, Snore and Nasal Pressure, Thermal Airflow, Respiratory Effort, CPAP Flow and Pressure, Oximetry was sampled at 50 Hz. Digital video and audio were recorded.      BASELINE STUDY WITHOUT CPAP RESULTS:  Lights Out was at 22:32 and Lights On at 05:03 for the night, split start at 01:55 AM, epoch 412. Total recording time (TRT) was 203, with a total sleep time (TST) of 135.5 minutes.   The patient's sleep latency was 70.5 minutes, which is delayed. REM was absent. The sleep efficiency was 66.7 %.    SLEEP ARCHITECTURE: WASO (Wake after sleep onset) was 14 minutes, Stage N1 was 21 minutes, Stage N2 was 114.5 minutes, Stage N3 was 0 minutes and Stage R (REM sleep) was 0 minutes.  The percentages were Stage N1 15.5%, Stage N2 84.5%, both increased, Stages N3 and R (REM sleep) were absent.  The arousals were noted as: 20 were spontaneous, 0 were associated with PLMs, 158 were associated with respiratory events.   Audio and video analysis did not show any abnormal or unusual movements, behaviors, phonations or vocalizations.  The patient  took no bathroom breaks. Mild to moderate snoring was noted. The EKG was in keeping with normal sinus rhythm (NSR).  RESPIRATORY ANALYSIS:  There were a total of 158 respiratory events:  7 obstructive apneas, 0 central apneas and 0 mixed apneas with a total of 7 apneas and an apnea index (AI) of 3.1. There were 151 hypopneas with a hypopnea index of 66.9. The patient also had 0 respiratory event related arousals (RERAs).  Snoring was noted.     The total APNEA/HYPOPNEA INDEX (AHI) was 70/hour and the total RESPIRATORY DISTURBANCE INDEX was 70. /hour.  0 events occurred in REM sleep and 302 events in NREM. The REM AHI was n/arsus a non-REM AHI of 70. /hour. The patient spent 66 minutes sleep time in the supine position 184 minutes in non-supine. The supine AHI was 78.6 /hour versus a non-supine AHI of 63.5 /hour.  OXYGEN SATURATION & C02:  The wake baseline 02 saturation was 97%, with the lowest being 83%. Time spent below 89% saturation equaled 14 minutes.  PERIODIC LIMB MOVEMENTS: The patient had a total of 0 Periodic Limb Movements.  The Periodic Limb Movement (PLM) index was 0 /hour and the PLM Arousal index was 0 /hour.   TITRATION STUDY WITH CPAP RESULTS:   The patient was tried on nasal pillows, but complained of nasal congestion and was fitted with a medium Simplus FFM, which he tolerated. CPAP was initiated at 6 cmH20 (patient felt 5 cm was not enough) with heated humidity per  AASM split night standards and pressure was advanced to 9 cmH20 because of hypopneas, apneas and desaturations.  At a PAP pressure of 9 cmH20, there was a reduction of the AHI to 0/hour, non-supine REM sleep achieved and O2 nadir of 95%.   Total recording time (TRT) was 188.5 minutes, with a total sleep time (TST) of 114 minutes. The patient's sleep latency was 83 minutes. REM latency was 79.5 minutes.  The sleep efficiency was 60.5%.    SLEEP ARCHITECTURE: Wake after sleep was 51 minutes, Stage N1 9.5 minutes, Stage  N2 45 minutes, Stage N3 0 minutes and Stage R (REM sleep) 59.5 minutes. The percentages were: Stage N1 8.3%, Stage N2 39.5%, Stage N3 was absent and Stage R (REM sleep) 52.2%, in keeping with significant rebound.  The arousals were noted as: 11 were spontaneous, 0 were associated with PLMs, 7 were associated with respiratory events.  RESPIRATORY ANALYSIS:  There were a total of 6 respiratory events: 0 obstructive apneas, 0 central apneas and 0 mixed apneas with a total of 0 apneas and an apnea index (AI) of 0. There were 6 hypopneas with a hypopnea index of 3.2 /hour. The patient also had 0 respiratory event related arousals (RERAs).      The total APNEA/HYPOPNEA INDEX  (AHI) was 3.2 /hour and the total RESPIRATORY DISTURBANCE INDEX was 3.2 /hour.  0 events occurred in REM sleep and 6 events in NREM. The REM AHI was 0 /hour versus a non-REM AHI of 6.6 /hour. REM sleep was achieved on a pressure of  cm/h2o (AHI was  .) The patient spent 7% of total sleep time in the supine position. The supine AHI was 30.0 /hour, versus a non-supine AHI of 1.1/hour.  OXYGEN SATURATION & C02:  The wake baseline 02 saturation was 97%, with the lowest being 90%. Time spent below 89% saturation equaled 0 minutes.  PERIODIC LIMB MOVEMENTS: The patient had a total of 0 Periodic Limb Movements. The Periodic Limb Movement (PLM) index was 0 /hour and the PLM Arousal index was 0 /hour.  Post-study, the patient indicated that sleep was the same as usual.  POLYSOMNOGRAPHY IMPRESSION :   1. Obstructive Sleep Apnea (OSA)  2. Dysfunctions associated with sleep stages or arousals from sleep  RECOMMENDATIONS:  1. This patient has severe obstructive sleep apnea and responded well on CPAP therapy. I will, therefore, start the patient on home CPAP treatment at a pressure of 9 cm via medium FFM, with heated humidity. The patient should be reminded to be fully compliant with PAP therapy to improve sleep related symptoms and decrease  long term cardiovascular risks. Please note that untreated obstructive sleep apnea carries additional perioperative morbidity. Patients with significant obstructive sleep apnea should receive perioperative PAP therapy and the surgeons and particularly the anesthesiologist should be informed of the diagnosis and the severity of the sleep disordered breathing. 2. This study shows sleep fragmentation and abnormal sleep stage percentages; these are nonspecific findings and per se do not signify an intrinsic sleep disorder or a cause for the patient's sleep-related symptoms. Causes include (but are not limited to) the first night effect of the sleep study, circadian rhythm disturbances, medication effect or an underlying mood disorder or medical problem.  3. The patient should be cautioned not to drive, work at heights, or operate dangerous or heavy equipment when tired or sleepy. Review and reiteration of good sleep hygiene measures should be pursued with any patient. 4. The patient will be seen in follow-up by  Dr. Frances Furbish at [facility] for discussion of the test results and further management strategies. The referring provider will be notified of the test results.  I certify that I have reviewed the entire raw data recording prior to the issuance of this report in accordance with the Standards of Accreditation of the American Academy of Sleep Medicine (AASM)   Huston Foley, MD, PhD Diplomat, American Board of Psychiatry and Neurology (Neurology and Sleep Medicine)

## 2017-02-10 NOTE — Progress Notes (Signed)
Kristen:  Patient referred by Ms. [name], seen by me on 01/18/17, split study on 01/30/17. Please call and notify patient that the recent sleep study confirmed the diagnosis of severe OSA. He did well with CPAP during the study with significant improvement of the respiratory events. Therefore, I would like start the patient on CPAP therapy at home by prescribing a machine for home use. I placed the order in the chart. The patient will need a follow up appointment with me in 10 weeks post set up that has to be scheduled; may schedule with Aundra MilletMegan or Eber JonesCarolyn if needed. Please go ahead and schedule while you have the patient on the phone and make sure patient understands the importance of keeping this window for the FU appointment, as it is often an insurance requirement and failing to adhere to this may result in losing coverage for sleep apnea treatment.  Please re-enforce the importance of compliance with treatment and the need for us to monitor compliance data - again an insurance requirement and good feedback for the patient as far as how they are doing.  Also remind patient, that any upcoming CPAP machine or mask issues, should be first addressed with the DME company. Please ask if patient has a preference regarding DME company.  Please arrange for CPAP set up at home through a DME company of patient's choice - once you have spoken to the patient - and faxed/routed report to PCP and referring MD (if other than PCP), you can close this encounter, thanks,   Huston FoleySaima Modest Draeger, MD, PhD [facility]

## 2017-02-10 NOTE — Addendum Note (Signed)
Addended by: Huston FoleyATHAR, Zeena Starkel on: 02/10/2017 12:51 PM   Modules accepted: Orders

## 2017-02-13 ENCOUNTER — Telehealth: Payer: Self-pay

## 2017-02-13 ENCOUNTER — Ambulatory Visit: Payer: No Typology Code available for payment source

## 2017-02-13 DIAGNOSIS — M545 Low back pain: Secondary | ICD-10-CM

## 2017-02-13 DIAGNOSIS — M25511 Pain in right shoulder: Principal | ICD-10-CM

## 2017-02-13 DIAGNOSIS — G8929 Other chronic pain: Secondary | ICD-10-CM

## 2017-02-13 DIAGNOSIS — M546 Pain in thoracic spine: Secondary | ICD-10-CM

## 2017-02-13 NOTE — Telephone Encounter (Signed)
-----   Message from Huston FoleySaima Athar, MD sent at 02/10/2017 12:51 PM EDT ----- Baxter HireKristen:  Patient referred by Ms. [name], seen by me on 01/18/17, split study on 01/30/17. Please call and notify patient that the recent sleep study confirmed the diagnosis of severe OSA. He did well with CPAP during the study with significant improvement of the respiratory events. Therefore, I would like start the patient on CPAP therapy at home by prescribing a machine for home use. I placed the order in the chart. The patient will need a follow up appointment with me in 10 weeks post set up that has to be scheduled; may schedule with Aundra MilletMegan or Eber JonesCarolyn if needed. Please go ahead and schedule while you have the patient on the phone and make sure patient understands the importance of keeping this window for the FU appointment, as it is often an insurance requirement and failing to adhere to this may result in losing coverage for sleep apnea treatment.  Please re-enforce the importance of compliance with treatment and the need for us to monitor compliance data - again an insurance requirement and good feedback for the patient as far as how they are doing.  Also remind patient, that any upcoming CPAP machine or mask issues, should be first addressed with the DME company. Please ask if patient has a preference regarding DME company.  Please arrange for CPAP set up at home through a DME company of patient's choice - once you have spoken to the patient - and faxed/routed report to PCP and referring MD (if other than PCP), you can close this encounter, thanks,   Huston FoleySaima Athar, MD, PhD [facility]

## 2017-02-13 NOTE — Patient Instructions (Signed)
  Resisted External Rotation: in Neutral - Bilateral    Sit or stand, yellow band in both hands, elbows at sides, bent to 90, forearms forward. Pinch shoulder blades together and rotate forearms out in a pain-free range of motion . Keep elbows at sides. Repeat _10___ times per set. Do _1___ sets per session. Do __2__ sessions per day.  http://orth.exer.us/966   Copyright  VHI. All rights reserved.

## 2017-02-13 NOTE — Telephone Encounter (Signed)
I called pt to discuss his sleep study results. No answer, left a message asking him to call me back. 

## 2017-02-13 NOTE — Therapy (Signed)
[facility] [address], [place], [address] Phone: [phone]   Fax:  [phone]  Physical Therapy Treatment  Patient Details  Name: [name] MRN: [id number] Date of Birth: 06/19/1966 Referring Provider: Fredia Beets, FNP  Encounter Date: 02/13/2017      PT End of Session - 02/13/17 0804    Visit Number 10   Number of Visits 19   Date for PT Re-Evaluation 03/06/17   PT Start Time 0804   PT Stop Time 2263   PT Time Calculation (min) 50 min   Activity Tolerance Patient tolerated treatment well   Behavior During Therapy [facility] for tasks assessed/performed      Past Medical History:  Diagnosis Date  . Diabetes mellitus without complication ([place])   . Fracture closed, humerus    right  . Fracture of humeral shaft, right, closed 09/02/2016  . Fracture of humerus, proximal, right, closed 09/03/2016  . Hypertension     Past Surgical History:  Procedure Laterality Date  . CHOLECYSTECTOMY    . ORIF HUMERUS FRACTURE Right 09/02/2016   Procedure: OPEN REDUCTION INTERNAL FIXATION (ORIF) RIGHT  HUMERAL SHAFT FRACTURE;  Surgeon: Altamese Volente, MD;  Location: [place];  Service: Orthopedics;  Laterality: Right;  . WISDOM TOOTH EXTRACTION      There were no vitals filed for this visit.      Subjective Assessment - 02/13/17 0808    Subjective Ate a little bit of breakfast. The dizziness went away last Thursday. Blood sugars were fine. Has not felt the "separation feeling" in his R shoulder since about last Thursday/Friday. R arm feels like a 1-2/10. Not so much in the shoulder right now.  The mid back is not bad, 2/10. I think I just strained it.   5/10 mid back pain at most for the past 7 days.    Pertinent History Chronic back and R shoulder pain. Pt states having R shoulder surgery on September 02, 2016 secondary to R shoulder fracture. Has not had PT for his R shoulder. Feels a constant discomfort at his arm  (feels like someone is compressing his shoulder such as there is a screw holding it together). Feels it more at the end of the day. But his R shoulder feels better since his surgery.  Pt also states difficulty raising his R shoulder due to sharp pain in the front and side.  Currently has difficulty reaching with his R arm.   Pt also states having pain in his middle back with constant pain. Had x-rays which revealed bone spurs in his vertebrae. Thinks there area compressed discs going on but has an appointment with his MD to discuss that.   Feels like his back pain has gotten worse since the past year. Pt states that his doctor did not see any fractures.  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   Pt states that his R arm does not feel like its healed but his MD says that it is and is very happy with the X-rays.  Does some exercises with the green or purple elastic band (ER, IR, flexion, extension since the 12th week mark post op). R shoulder is better compared to a month ago.   R hand dominant, L hand writing and eating, and cutting with a knife   Patient Stated Goals Want sort of normal range of motion for his R shoulder.    Currently in Pain? Yes  Pain Score 2   R shoulder and mid back   Pain Onset More than a month ago            [facility] PT Assessment - 02/13/17 0910      Observation/Other Assessments   Modified [place] 38%   Quick DASH  31.8%     AROM   Right Shoulder Flexion 128 Degrees  at start of session   Right Shoulder ABduction 122 Degrees  at start of session                             PT Education - 02/13/17 0831    Education provided Yes   Education Details ther-ex, HEP   Person(s) Educated Patient   Methods Explanation;Demonstration;Tactile cues;Verbal cues;Handout   Comprehension Verbalized understanding;Returned demonstration         Objectives    23 weeks post op R shoulder AROM: 128degrees flexion, 122 degrees  abduction at start of session       Manual therapy   Sitting (R arm propped in about 80 degrees scaption): STM R teres major Latissimus dorsi R side R distal pectoralis major muscle area   STM R arm at scar tissue area     There-ex   Vitals obtained, L arm sitting, mechanically taken: 108/71, HR 102   Lower trap raise on table (forehead resting on forearm) 10x2 with 5 second holds   Seated thoracic extension over chair 10x5 seconds for 2 sets  R shoulder AROM: 140 degrees flexion, 130 degrees abduction  Reviewed progress with R shoulder flexion and abduction AROM with pt.   Standing low rows resisting red band 10x2 with 5 second holds  Then with green band 10x5 seconds   Shoulder ER resisting yellow band 10x5 seconds for 2 sets  Reviewed and given as part of his HEP. Pt demonstrated and verbalized understanding. Handout and yellow band provided.   R shoulder IR resisting red band 10x5 seconds for 2 sets    Improved exercise technique, movement at target joints, use of target muscles after min to mod verbal, visual, tactile cues.    Pt demonstrates overall improved R shoulder AROM, decreased back pain, and improved function since initial evaluation. Improved R shoulder AROM following manual therapy to decrease teres major, latissimus dorsi, and pectoralis major muscle tension as well as exercises that promote thoracic extension and scapular muscle use. Currently working on R UE strengthening as appropriate to promote increase functional use of R UE. Pt still demonstrates limited R shoulder AROM, weakness, and difficulty performing functional tasks and would benefit from continued skilled physical therapy services to address the aforementioned deficits.            PT Long Term Goals - 02/13/17 0913      PT LONG TERM GOAL #1   Title Patient will improve R shoulder flexion and abduction AROM to at least 135 degrees to promote ability to reach with less  pain.    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    Baseline 8/10 back  pain at worst for the past month (01/04/2017); 5/10 mid back pain at most for the past 7 days (02/13/2017)   Time 6   Period Weeks   Status Achieved     PT LONG TERM GOAL #3   Title Patient will improve his Quick Dash Disability/Symptom score by at least 15% as a demonstration of improved function.    Baseline 47.72% (01/04/2017); 31.8% (02/13/2017)   Time 6   Period Weeks   Status Achieved     PT LONG TERM GOAL #4   Title Patient will improve his Modified [place] Low Back Pain Disability Questionnaire score by at least 12% as a demonstration of improved function.    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    Baseline 5/10 mid back pain at most for the past 7 days (02/13/2017)   Time 4   Period Weeks   Status New               Plan - 02/13/17 0109    Clinical Impression Statement Pt demonstrates overall improved R shoulder AROM, decreased back pain, and improved function since initial evaluation. Improved R shoulder AROM following manual therapy to decrease teres major, latissimus dorsi, and pectoralis major muscle tension as well as exercises that promote thoracic extension and scapular muscle use. Currently working on R UE strengthening as appropriate to promote increase functional use of R UE. Pt still demonstrates limited R shoulder AROM, weakness, and difficulty performing  functional tasks and would benefit from continued skilled physical therapy services to address the aforementioned deficits.    History and Personal Factors relevant to plan of care: R humeral shaft fracture, S/P ORIF, back pain, fincances   Clinical Presentation Stable   Clinical Presentation due to: Improving R shoulder AROM, function; decreased back pain   Clinical Decision Making Low   Rehab Potential Fair   Clinical Impairments Affecting Rehab Potential Chronicity of condition, finances/insurance   PT Frequency 2x / week   PT Duration 4 weeks   PT Treatment/Interventions Therapeutic activities;Therapeutic exercise;Patient/family education;Manual techniques;Dry needling;Neuromuscular re-education;Traction;Iontophoresis '4mg'$ /ml Dexamethasone;Electrical Stimulation;Aquatic Therapy  modalities if appropriate; traction for back if appropriate   PT Next Visit Plan scapular strengthening, R shoulder AROM/gentle strengthening as appropriate, manual techniques, hip and trunk strengthening, posture   Consulted and Agree with Plan of Care Patient      Patient will benefit from skilled therapeutic intervention in order to improve the following deficits and impairments:  Decreased strength, Pain, Postural dysfunction, Improper body mechanics, Decreased range of motion  Visit Diagnosis: Chronic right shoulder pain - Plan: PT plan of care cert/re-cert  Pain in thoracic spine - Plan: PT plan of care cert/re-cert  Chronic low back pain, unspecified back pain laterality, with sciatica presence unspecified - Plan: PT plan of care cert/re-cert     Problem List Patient Active Problem List   Diagnosis Date Noted  . Fracture of humerus, proximal, right, closed 09/03/2016  . Fracture of humeral shaft, right, closed 09/02/2016  . Diabetes ([place]) 09/02/2016  . Essential hypertension, benign 09/02/2016   Joneen Boers PT, DPT   02/13/2017, 9:37 AM  [facility] [address] [place], [address]  Phone: [phone]   Fax:  [phone]  Name: [name] MRN: [id number] Date of Birth: 23-Nov-1965

## 2017-02-15 ENCOUNTER — Ambulatory Visit: Payer: No Typology Code available for payment source

## 2017-02-15 DIAGNOSIS — M25511 Pain in right shoulder: Principal | ICD-10-CM

## 2017-02-15 DIAGNOSIS — G8929 Other chronic pain: Secondary | ICD-10-CM

## 2017-02-15 NOTE — Therapy (Signed)
[facility] [address] [place], [address] Phone: [phone]   Fax:  [phone]  Physical Therapy Treatment  Patient Details  Name: [name] MRN: [id number] Date of Birth: 1965-12-31 Referring Provider: Fredia Beets, FNP  Encounter Date: 02/15/2017      PT End of Session - 02/15/17 0806    Visit Number 11   Number of Visits 19   Date for PT Re-Evaluation 03/06/17   PT Start Time 0806   PT Stop Time 0848   PT Time Calculation (min) 42 min   Activity Tolerance Patient tolerated treatment well   Behavior During Therapy [facility] for tasks assessed/performed      Past Medical History:  Diagnosis Date  . Diabetes mellitus without complication ([place])   . Fracture closed, humerus    right  . Fracture of humeral shaft, right, closed 09/02/2016  . Fracture of humerus, proximal, right, closed 09/03/2016  . Hypertension     Past Surgical History:  Procedure Laterality Date  . CHOLECYSTECTOMY    . ORIF HUMERUS FRACTURE Right 09/02/2016   Procedure: OPEN REDUCTION INTERNAL FIXATION (ORIF) RIGHT  HUMERAL SHAFT FRACTURE;  Surgeon: Altamese Ravenswood, MD;  Location: [place];  Service: Orthopedics;  Laterality: Right;  . WISDOM TOOTH EXTRACTION      There were no vitals filed for this visit.      Subjective Assessment - 02/15/17 0808    Subjective Ate breakfast today. R shoulder is not bad, 1/10 currently. R arm is not bad either.    Pertinent History Chronic back and R shoulder pain. Pt states having R shoulder surgery on September 02, 2016 secondary to R shoulder fracture. Has not had PT for his R shoulder. Feels a constant discomfort at his arm (feels like someone is compressing his shoulder such as there is a screw holding it together). Feels it more at the end of the day. But his R shoulder feels better since his surgery.  Pt also states difficulty raising his R shoulder due to sharp pain in the front and side.   Currently has difficulty reaching with his R arm.   Pt also states having pain in his middle back with constant pain. Had x-rays which revealed bone spurs in his vertebrae. Thinks there area compressed discs going on but has an appointment with his MD to discuss that.   Feels like his back pain has gotten worse since the past year. Pt states that his doctor did not see any fractures.  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   Pt states that his R arm does not feel like its healed but his MD says that it is and is very happy with the X-rays.  Does some exercises with the green or purple elastic band (ER, IR, flexion, extension since the 12th week mark post op). R shoulder is better compared to a month ago.   R hand dominant, L hand writing and eating, and cutting with a knife   Patient Stated Goals Want sort of normal range of motion for his R shoulder.    Currently in Pain? Yes   Pain Score 1    Pain Onset More than a month ago                                 PT Education - 02/15/17 0347    Education provided  Yes   Education Details ther-ex   Person(s) Educated Patient   Methods Explanation;Demonstration;Tactile cues;Verbal cues   Comprehension Verbalized understanding;Returned demonstration        Objectives    23 -24 weeks post op R shoulder AROM: 140degrees flexion, 125degrees abduction at start of session   There-ex   Upgraded ER HEP back up to green band since that was what pt was using before (from MD) and no pain. Supine open books 10x5 seconds to promote scapular retraction and pectoralis muscle strengthening  Supine R shoulder flexion resisting 1 lb 10x 5 second holds at end range  Then 2 lbs10x2 with 5 second holds at end range Standing biceps flexion 4 lbs 10x  Then 5 lbs 10x2  standing triceps extension resisting green band 10x3  Seated thoracic extension over chair 10x5 seconds for 2 sets  Ball wall flexion 10x5  seconds  Ball wall scaption 10x5 seconds  Lower trap raise on table (forehead resting on forearm) 10x2 with 5 second holds     Improved exercise technique, movement at target joints, use of target muscles after min to mod verbal, visual, tactile cues.    Good carry over of shoulder flexion AROM from last session. Continued working on gentle strengthening and AROM to promote ability to use R UE for functional tasks. Decreased back pain with thoracic extension, back pain returns when returning back to starting position.                  PT Long Term Goals - 02/13/17 0913      PT LONG TERM GOAL #1   Title Patient will improve R shoulder flexion and abduction AROM to at least 135 degrees to promote ability to reach with less pain.    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    Baseline 8/10 back pain at worst for the past month (01/04/2017); 5/10 mid back pain at most for the past 7 days (02/13/2017)   Time 6   Period Weeks   Status Achieved     PT LONG TERM GOAL #3   Title Patient will improve his Quick Dash Disability/Symptom score by at least 15% as a demonstration of improved function.    Baseline 47.72% (01/04/2017); 31.8% (02/13/2017)   Time 6   Period Weeks   Status Achieved     PT LONG TERM GOAL #4   Title Patient will improve his Modified [place] Low Back Pain Disability Questionnaire score by at least 12% as a demonstration of improved function.    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    Baseline 5/10 mid back pain at most for the past 7 days (02/13/2017)   Time 4   Period Weeks   Status New  Plan - 02/15/17 0815    Clinical Impression Statement Good carry over of shoulder flexion AROM from last session. Continued working on gentle strengthening and AROM to promote ability to use R UE for functional tasks. Decreased back pain with thoracic extension, back pain returns when returning back to starting position.    History and Personal Factors relevant to plan of care: R humeral shaft fracture, S/P ORIF, back pain, finances   Clinical Presentation Stable   Clinical Presentation due to: good carryover of R shoulder flexion AROM from previous session   Clinical Decision Making Low   Rehab Potential Fair   Clinical Impairments Affecting Rehab Potential Chronicity of condition, finances/insurance   PT Frequency 2x / week   PT Duration 4 weeks   PT Treatment/Interventions Therapeutic activities;Therapeutic exercise;Patient/family education;Manual techniques;Dry needling;Neuromuscular re-education;Traction;Iontophoresis 42m/ml Dexamethasone;Electrical Stimulation;Aquatic Therapy  modalities if appropriate; traction for back if appropriate   PT Next Visit Plan scapular strengthening, R shoulder AROM/gentle strengthening as appropriate, manual techniques, hip and trunk strengthening, posture   Consulted and Agree with Plan of Care Patient      Patient will benefit from skilled therapeutic intervention in order to improve the following deficits and impairments:  Decreased strength, Pain, Postural dysfunction, Improper body mechanics, Decreased range of motion  Visit Diagnosis: Chronic right shoulder pain     Problem List Patient Active Problem List   Diagnosis Date Noted  . Fracture of humerus, proximal, right, closed 09/03/2016  . Fracture of humeral shaft, right, closed 09/02/2016  .  [place] 09/02/2016  . Essential hypertension, benign 09/02/2016    MJoneen BoersPT, DPT   02/15/2017, 8:57 AM  [facility] [address] [place] [address]Phone: [phone]  Fax:  [phone] Name: RFuller MakinMRN: [id number] of Birth: 901-18-1967

## 2017-02-16 NOTE — Telephone Encounter (Signed)
I called [name]. I advised [name] that Dr. 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 Dr. Frances FurbishAthar recommends that [name] start a cpap at home. I reviewed PAP compliance expectations with the [name]. [name] is agreeable to starting a CPAP. I advised [name] that an order will be sent to a DME, [facility], and [facility] will call the [name] within about one week after they file with the [name]'s insurance. [facility] will show the [name] how to use the machine, fit for masks, and troubleshoot the CPAP if needed. A follow up appt was made for insurance purposes with Dr. Frances FurbishAthar on Monday, October 1st, 2018 at 8:30am. [name] verbalized understanding to arrive 15 minutes early and bring their CPAP. A letter with all of this information in it will be mailed to the [name] as a reminder. I verified with the [name] that the address we have on file is correct. [name] verbalized understanding of results. [name] had no questions at this time but was encouraged to call back if questions arise.

## 2017-02-16 NOTE — Telephone Encounter (Signed)
I called pt again to discuss his sleep study results. No answer, left a message asking him to call me back. 

## 2017-02-20 ENCOUNTER — Ambulatory Visit: Payer: No Typology Code available for payment source

## 2017-02-22 ENCOUNTER — Ambulatory Visit: Payer: No Typology Code available for payment source

## 2017-02-22 DIAGNOSIS — M25511 Pain in right shoulder: Principal | ICD-10-CM

## 2017-02-22 DIAGNOSIS — G8929 Other chronic pain: Secondary | ICD-10-CM

## 2017-02-22 NOTE — Therapy (Signed)
[facility] [address] [place], [address] Phone: [phone]   Fax:  [phone]  Physical Therapy Treatment  Patient Details  Name: [name] MRN: [id number] Date of Birth: [birth year] Referring Provider: Fredia Beets, FNP  Encounter Date: 02/22/2017      PT End of Session - 02/22/17 0803    Visit Number 12   Number of Visits 19   Date for PT Re-Evaluation 03/06/17   PT Start Time 0804   PT Stop Time 6384   PT Time Calculation (min) 40 min   Activity Tolerance Patient tolerated treatment well   Behavior During Therapy [facility] for tasks assessed/performed      Past Medical History:  Diagnosis Date  . Diabetes mellitus without complication ([place])   . Fracture closed, humerus    right  . Fracture of humeral shaft, right, closed 09/02/2016  . Fracture of humerus, proximal, right, closed 09/03/2016  . Hypertension     Past Surgical History:  Procedure Laterality Date  . CHOLECYSTECTOMY    . ORIF HUMERUS FRACTURE Right 09/02/2016   Procedure: OPEN REDUCTION INTERNAL FIXATION (ORIF) RIGHT  HUMERAL SHAFT FRACTURE;  Surgeon: Altamese Needham, MD;  Location: [facility];  Service: Orthopedics;  Laterality: Right;  . WISDOM TOOTH EXTRACTION      There were no vitals filed for this visit.      Subjective Assessment - 02/22/17 0806    Subjective R shoulder is not bad. Not really having R shoulder pain.  Back is not bad but kind of the same (1/10). Has been relaxing.  I've seen some progress with the shoulder.   Would like to work on his shoulder today.    Pertinent History Chronic back and R shoulder pain. Pt states having R shoulder surgery on September 02, 2016 secondary to R shoulder fracture. Has not had PT for his R shoulder. Feels a constant discomfort at his arm (feels like someone is compressing his shoulder such as there is a screw holding it together). Feels it more at the end of the day. But his R  shoulder feels better since his surgery.  Pt also states difficulty raising his R shoulder due to sharp pain in the front and side.  Currently has difficulty reaching with his R arm.   Pt also states having pain in his middle back with constant pain. Had x-rays which revealed bone spurs in his vertebrae. Thinks there area compressed discs going on but has an appointment with his MD to discuss that.   Feels like his back pain has gotten worse since the past year. Pt states that his doctor did not see any fractures.  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   Pt states that his R arm does not feel like its healed but his MD says that it is and is very happy with the X-rays.  Does some exercises with the green or purple elastic band (ER, IR, flexion, extension since the 12th week mark post op). R shoulder is better compared to a month ago.   R hand dominant, L hand writing and eating, and cutting with a knife   Patient Stated Goals Want sort of normal range of motion for his R shoulder.    Currently in Pain? Yes   Pain Score 1    Pain Onset More than a month ago  PT Education - 02/22/17 0810    Education provided Yes   Education Details ther-ex   [employer]) Educated Patient   Methods Explanation;Demonstration;Tactile cues   Comprehension Verbalized understanding;Returned demonstration         Objectives     R shoulder AROM: 131degrees flexion, 120degrees abduction at start of session   There-ex     Supine open books 10x5 seconds for 2 sets to promote scapular retraction and pectoralis muscle stretching   Supine R shoulder flexion resisting 2 lb 10x2 with 5 second holds at end range to promote strength and flexion AROM  Lower trap raise on table (forehead resting on forearm) 10x2 with 5 second holds   Ball wall flexion 10x5 seconds   Ball wall scaption 10x5 seconds   135 degrees flexion AROM  afterwards   Scapular retraction resisting red band with back at corner to promote scapular mobility and strength 10x5 seconds  Improved exercise technique, movement at target joints, use of target muscles after min to mod verbal, visual, tactile cues.     Manual therapy  STM R terres major muscle with R arm propped in scaption   Then to R latissimus dorsi muscles  R shoulder AROM afterwards:  135 degrees R shoulder flexion   132 degrees R shoulder abduction   Improved R shoulder abduction AROM after manual therapy to promote teres major and latissimus muscle mobility. Continued on R shoulder and scapular strengthening to promote ability to raise his R arm and improve ability to perform functional tasks.         PT Long Term Goals - 02/13/17 0913      PT LONG TERM GOAL #1   Title Patient will improve R shoulder flexion and abduction AROM to at least 135 degrees to promote ability to reach with less pain.    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    Baseline 8/10 back pain at worst for the past month (01/04/2017); 5/10 mid back pain at most for the past 7 days (02/13/2017)   Time 6   Period Weeks   Status Achieved     PT LONG TERM GOAL #3   Title Patient will improve his Quick Dash Disability/Symptom score by at least 15% as a demonstration of improved function.    Baseline 47.72% (01/04/2017); 31.8% (02/13/2017)   Time 6   Period Weeks   Status Achieved     PT LONG TERM GOAL #4   Title Patient will improve his Modified [place] Low Back Pain Disability Questionnaire score by at least 12% as a demonstration of improved function.     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    Baseline 5/10 mid back pain at most for the past 7 days (02/13/2017)   Time 4   Period Weeks   Status New  Plan - 02/22/17 0811    Clinical Impression Statement Improved R shoulder abduction AROM after manual therapy to promote teres major and latissimus muscle mobility. Continued on R shoulder and scapular strengthening to promote ability to raise his R arm and improve ability to perform functional tasks.    History and Personal Factors relevant to plan of care: R humeral shaft fracture, S/P ORIF, back pain, finances   Clinical Presentation Stable   Clinical Presentation due to: improved R shoulder function and movement per pt reports   Clinical Decision Making Low   Rehab Potential Fair   Clinical Impairments Affecting Rehab Potential Chronicity of condition, finances/insurance   PT Frequency 2x / week   PT Duration 4 weeks   PT Treatment/Interventions Therapeutic activities;Therapeutic exercise;Patient/family education;Manual techniques;Dry needling;Neuromuscular re-education;Traction;Iontophoresis 49m/ml Dexamethasone;Electrical Stimulation;Aquatic Therapy  modalities if appropriate; traction for back if appropriate   PT Next Visit Plan scapular strengthening, R shoulder AROM/gentle strengthening as appropriate, manual techniques, hip and trunk strengthening, posture   Consulted and Agree with Plan of Care Patient      Patient will benefit from skilled therapeutic intervention in order to improve the following deficits and impairments:  Decreased strength, Pain, Postural dysfunction, Improper body mechanics, Decreased range of motion  Visit Diagnosis: Chronic right shoulder pain     Problem List Patient Active  Problem List   Diagnosis Date Noted  . Fracture of humerus, proximal, right, closed 09/03/2016  . Fracture of humeral shaft, right, closed 09/02/2016  . Diabetes ([place] 09/02/2016  . Essential hypertension, benign 09/02/2016   MJoneen BoersPT, DPT  02/22/2017, 12:43 PM  [facility] [address] [place] [address]Phone: [phone]  Fax:  [phone] Name: RPedro WhitersMRN: [id number] of Birth: [birth year]

## 2017-02-23 ENCOUNTER — Encounter (INDEPENDENT_AMBULATORY_CARE_PROVIDER_SITE_OTHER): Payer: Self-pay | Admitting: Physical Medicine and Rehabilitation

## 2017-02-27 ENCOUNTER — Ambulatory Visit: Payer: No Typology Code available for payment source

## 2017-02-27 DIAGNOSIS — M545 Low back pain: Secondary | ICD-10-CM

## 2017-02-27 DIAGNOSIS — G8929 Other chronic pain: Secondary | ICD-10-CM

## 2017-02-27 DIAGNOSIS — M25511 Pain in right shoulder: Principal | ICD-10-CM

## 2017-02-27 DIAGNOSIS — M546 Pain in thoracic spine: Secondary | ICD-10-CM

## 2017-02-27 NOTE — Therapy (Signed)
[facility] [address] [place], [place], [address] Phone: [phone]   Fax:  [phone]  Physical Therapy Treatment  Patient Details  Name: [name] MRN: [id number] Date of Birth: 02/01/1966 Referring Provider: Mandesia [name], FNP  Encounter Date: 02/27/2017      PT End of Session - 02/27/17 1441    Visit Number 13   Number of Visits 19   Date for PT Re-Evaluation 03/06/17   PT Start Time 1440   PT Stop Time 1524   PT Time Calculation (min) 44 min   Activity Tolerance Patient tolerated treatment well   Behavior During Therapy [facility] for tasks assessed/performed      Past Medical History:  Diagnosis Date  . Diabetes mellitus without complication ([facility])   . Fracture closed, humerus    right  . Fracture of humeral shaft, right, closed 09/02/2016  . Fracture of humerus, proximal, right, closed 09/03/2016  . Hypertension     Past Surgical History:  Procedure Laterality Date  . CHOLECYSTECTOMY    . ORIF HUMERUS FRACTURE Right 09/02/2016   Procedure: OPEN REDUCTION INTERNAL FIXATION (ORIF) RIGHT  HUMERAL SHAFT FRACTURE;  Surgeon: Michael Handy, MD;  Location[facility];  Service: Orthopedics;  Laterality: Right;  . WISDOM TOOTH EXTRACTION      There were no vitals filed for this visit.      Subjective Assessment - 02/27/17 1441    Subjective R shoulder is very good. No pain currently. A little bit of tightness and pain but not major. Back is doing alright. 2-3/10 currently. Getting a cortisone injection tomorrow for his back.    Pertinent History Chronic back and R shoulder pain. Pt states having R shoulder surgery on September 02, 2016 secondary to R shoulder fracture. Has not had PT for his R shoulder. Feels a constant discomfort at his arm (feels like someone is compressing his shoulder such as there is a screw holding it together). Feels it more at the end of the day. But his R shoulder feels better since  his surgery.  Pt also states difficulty raising his R shoulder due to sharp pain in the front and side.  Currently has difficulty reaching with his R arm.   Pt also states having pain in his middle back with constant pain. Had x-rays which revealed bone spurs in his vertebrae. Thinks there area compressed discs going on but has an appointment with his MD to discuss that.   Feels like his back pain has gotten worse since the past year. Pt states that his doctor did not see any fractures.  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   Pt states that his R arm does not feel like its healed but his MD says that it is and is very happy with the X-rays.  Does some exercises with the green or purple elastic band (ER, IR, flexion, extension since the 12th week mark post op). R shoulder is better compared to a month ago.  Wants to work on his shoulder today.   R hand dominant, L hand writing and eating, and cutting with a knife   Patient Stated Goals Want sort of normal range of motion for his R shoulder.    Currently in Pain? Yes   Pain Score 3   2-3/10 back   Pain Onset More than a month ago                                   PT Education - 02/27/17 1447    Education provided Yes   Education Details ther-ex   [employer]) Educated Patient   Methods Explanation;Demonstration;Tactile cues;Verbal cues   Comprehension Returned demonstration;Verbalized understanding        Objectives     R shoulder AROM: 126degrees flexion, 119degrees abduction at start of session. Has not done his exercise yet today    There-ex  Supine R shoulder flexion resisting 2 lb 10x2 with 5 second holds at end range to promote strength and flexion AROM  Supine open books 10x5 seconds for 2 sets to promote scapular retraction and pectoralis muscle stretching    130 degrees R shoulder flexion, 124 degrees abduction   Seated gentle lumbar flexion. Decreased low back  pain   Ball wall flexion 10x5 seconds   Ball wall scaption 10x5 seconds   Bilateral shoulder scaption with scapular retraction at corner wall 10x, then 5x   R shoulder flexion 135 degrees, R shoulder abduction 120 degrees   Sitting on dyna disc  Anterior and posterior pelvic tilts 10x3   Lateral pelvic tilts 10x2    Back fatigue afterwards. Pt states feeling thoracolumbar area catching, which is what his back pain is   Transversus abdominis contraction for 10x5 seconds  Seated thoracic extension over chair 10x5 seconds with 3 inch step on feet to promote thoracic mobility.      Improved exercise technique, movement at target joints, use of target muscles after min to mod verbal, visual, tactile cues.    Improved R shoulder flexion and abduction AROM after supine exercises to promote stretching. Low back pain area located around the thoracolumbar junction. Worked on thoracic and lumbar mobility to help redistribute movement throughout spine and decrease stress around the throacolumbar area.           PT Long Term Goals - 02/13/17 0913      PT LONG TERM GOAL #1   Title Patient will improve R shoulder flexion and abduction AROM to at least 135 degrees to promote ability to reach with less pain.    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    Baseline 8/10 back pain at worst for the past month (01/04/2017); 5/10 mid back pain at most for the past 7 days (02/13/2017)   Time 6   Period Weeks   Status Achieved     PT LONG TERM GOAL #3   Title Patient will improve his Quick Dash Disability/Symptom score by at least 15% as a demonstration of  improved function.    Baseline 47.72% (01/04/2017); 31.8% (02/13/2017)   Time 6   Period Weeks   Status Achieved     PT LONG TERM GOAL #4   Title Patient will improve his Modified [place] Low Back Pain Disability Questionnaire score by at least 12% as a demonstration of improved function.    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    Baseline 5/10 mid back pain at most for the past 7 days (02/13/2017)   Time 4   Period Weeks   Status New               Plan - 02/27/17 1448    Clinical Impression Statement Improved R shoulder flexion and abduction AROM after supine exercises to promote stretching. Low back pain area located around the thoracolumbar junction. Worked on thoracic and lumbar mobility to help redistribute movement throughout spine and decrease stress around the throacolumbar area.    History and Personal Factors relevant to plan of care: R humeral shaft fracture, S/P ORIF, back pain, finances   Clinical Presentation Stable   Clinical Presentation due to: improved shoulder AROM with exercises    Clinical Decision Making Low   Rehab Potential Fair   Clinical Impairments Affecting Rehab Potential Chronicity of condition, finances/insurance   PT Frequency 2x / week   PT Duration 4 weeks   PT Treatment/Interventions Therapeutic activities;Therapeutic exercise;Patient/family education;Manual techniques;Dry needling;Neuromuscular re-education;Traction;Iontophoresis 4mg/ml Dexamethasone;Electrical Stimulation;Aquatic Therapy  modalities if appropriate; traction for back if appropriate   PT Next Visit Plan scapular strengthening, R shoulder AROM/gentle strengthening as appropriate, manual techniques, hip and trunk strengthening, posture   Consulted and Agree with Plan of Care  Patient      Patient will benefit from skilled therapeutic intervention in order to improve the following deficits and impairments:  Decreased strength, Pain, Postural dysfunction, Improper body mechanics, Decreased range of motion  Visit Diagnosis: Chronic right shoulder pain  Pain in thoracic spine  Chronic low back pain, unspecified back pain laterality, with sciatica presence unspecified     Problem List Patient Active Problem List   Diagnosis Date Noted  . Fracture of humerus, proximal, right, closed 09/03/2016  . Fracture of humeral shaft, right, closed 09/02/2016  . Diabetes ([facility]) 09/02/2016  . Essential hypertension, benign 09/02/2016    Miguel Laygo PT, DPT   02/27/2017, 4:21 PM  [facility] [address] [place], [place], [address] Phone: [phone]   Fax:  [phone]  Name: [name] MRN: [id number] Date of Birth: 02/04/1966   

## 2017-02-28 ENCOUNTER — Ambulatory Visit (INDEPENDENT_AMBULATORY_CARE_PROVIDER_SITE_OTHER): Payer: Self-pay | Admitting: Physical Medicine and Rehabilitation

## 2017-02-28 ENCOUNTER — Encounter (INDEPENDENT_AMBULATORY_CARE_PROVIDER_SITE_OTHER): Payer: Self-pay | Admitting: Physical Medicine and Rehabilitation

## 2017-02-28 ENCOUNTER — Ambulatory Visit (INDEPENDENT_AMBULATORY_CARE_PROVIDER_SITE_OTHER): Payer: Self-pay

## 2017-02-28 VITALS — BP 130/87 | HR 104

## 2017-02-28 DIAGNOSIS — R0781 Pleurodynia: Secondary | ICD-10-CM

## 2017-02-28 DIAGNOSIS — G8929 Other chronic pain: Secondary | ICD-10-CM

## 2017-02-28 DIAGNOSIS — M545 Low back pain: Secondary | ICD-10-CM

## 2017-02-28 DIAGNOSIS — M546 Pain in thoracic spine: Secondary | ICD-10-CM

## 2017-02-28 DIAGNOSIS — M47816 Spondylosis without myelopathy or radiculopathy, lumbar region: Secondary | ICD-10-CM

## 2017-02-28 MED ORDER — LIDOCAINE HCL (PF) 1 % IJ SOLN
2.0000 mL | Freq: Once | INTRAMUSCULAR | Status: AC
  Administered 2017-02-28: 2 mL

## 2017-02-28 MED ORDER — METHYLPREDNISOLONE ACETATE 80 MG/ML IJ SUSP
80.0000 mg | Freq: Once | INTRAMUSCULAR | Status: AC
  Administered 2017-02-28: 80 mg

## 2017-02-28 NOTE — Progress Notes (Deleted)
Pain in the mid back for a few years and has gotten worse recently. Works as a Investment banker, operationalchef so on feel all day and gets worse as day goes on. No leg pain. Describes pain as a grinding type of pain with movement.

## 2017-02-28 NOTE — Progress Notes (Unsigned)
Fluoro Time:  17sec MGy: 22.67

## 2017-02-28 NOTE — Patient Instructions (Signed)

## 2017-03-01 ENCOUNTER — Other Ambulatory Visit: Payer: Self-pay | Admitting: Family Medicine

## 2017-03-01 ENCOUNTER — Encounter (INDEPENDENT_AMBULATORY_CARE_PROVIDER_SITE_OTHER): Payer: Self-pay | Admitting: Physical Medicine and Rehabilitation

## 2017-03-01 ENCOUNTER — Ambulatory Visit: Payer: No Typology Code available for payment source

## 2017-03-01 DIAGNOSIS — G8929 Other chronic pain: Secondary | ICD-10-CM

## 2017-03-01 DIAGNOSIS — M546 Pain in thoracic spine: Secondary | ICD-10-CM

## 2017-03-01 DIAGNOSIS — E118 Type 2 diabetes mellitus with unspecified complications: Secondary | ICD-10-CM

## 2017-03-01 DIAGNOSIS — Z794 Long term (current) use of insulin: Principal | ICD-10-CM

## 2017-03-01 DIAGNOSIS — M25511 Pain in right shoulder: Principal | ICD-10-CM

## 2017-03-01 DIAGNOSIS — M545 Low back pain: Secondary | ICD-10-CM

## 2017-03-01 MED ORDER — INSULIN GLARGINE 100 UNIT/ML SOLOSTAR PEN: 40.0000 [IU] | PEN_INJECTOR | Freq: Every day | SUBCUTANEOUS | 11 refills | Status: DC

## 2017-03-01 MED ORDER — INSULIN PEN NEEDLE 32G X 8 MM MISC: 11 refills | Status: DC

## 2017-03-01 MED FILL — TRUE METRIX TEST STRIP: 25 days supply | Qty: 100 | Fill #1

## 2017-03-01 MED FILL — $LANTUS 100 UNITS/ML VIAL: 100 | 25 days supply | Qty: 10 | Fill #0

## 2017-03-01 MED FILL — GABAPENTIN 300 MG CAPSULE: 300 | 30 days supply | Qty: 30 | Fill #5

## 2017-03-01 MED FILL — TRUEPLUS SYR 0.5ML 31GX5/16: 31G X 5/16" | 25 days supply | Qty: 100 | Fill #0

## 2017-03-01 MED FILL — LISINOPRIL 10 MG TABLET: 10 | 30 days supply | Qty: 30 | Fill #1

## 2017-03-01 MED FILL — METFORMIN HCL ER 500 MG TAB: 500 | 30 days supply | Qty: 120 | Fill #2

## 2017-03-01 NOTE — Progress Notes (Signed)
[name] - 51 y.o. male MRN [id number]  Date of birth: 12/22/1965  Office Visit Note: Visit Date: 02/28/2017 PCP: Lizbeth Bark, FNP Referred by: Thomas Hoff*  Subjective: Chief Complaint  Patient presents with  . Lower Back - Pain  . 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 He comes in today for evaluation and management of his thoracic and low back pain. He has been seeing Dr. August Saucer and continues to see Dr. August Saucer for his orthopedic complaints. An MRI of the lumbar spine was performed and this is reviewed below. The patient has an interesting somatic complaints of pain in the really upper lumbar region and may be radiating pattern to the ribs with catching sensations and again just different vague complaints of severe pain at different times. He was coming in today for planned epidural injection versus facet joint block at the request of Dr. August Saucer. When I sit down to talk to him the pain was really all over the place and terms of symptoms. Briefly looks like his primary care physician obtained a thoracic x-ray which was really unrevealing except for some anterior bridging osteophytes which were mild. Dr. August Saucer did get x-rays as well of the lumbar spine and he felt like there were some degenerative changes at the T12 area. The patient is really focused on that today and what he was told as far as being a problem there. Likely the MRI which is again reviewed in detail below image the T11 and T12 area and there is no real concern of that area there is a small Schmorl's node in one of the endplates but the disc looks okay there without disc herniation or focal stenosis. Again real mild findings of the thoracic spine on the MRI. His biggest finding is bilateral L4-5 facet joint arthritis with facet joint fluid signal as well as. Facet soft tissue inflammation. I discussed these findings at length with the  patient and I do think part of his pain is probably coming from a referral pattern from these facet joints due to his standing all day and his increased weight. He has no radicular symptoms.    Review of Systems  Constitutional: Negative for chills, fever, malaise/fatigue and weight loss.  HENT: Negative for hearing loss and sinus pain.   Eyes: Negative for blurred vision, double vision and photophobia.  Respiratory: Negative for cough and shortness of breath.   Cardiovascular: Negative for chest pain, palpitations and leg swelling.  Gastrointestinal: Negative for abdominal pain, nausea and vomiting.  Genitourinary: Negative for flank pain.  Musculoskeletal: Positive for back pain. Negative for myalgias.  Skin: Negative for itching and rash.  Neurological: Negative for tremors, focal weakness and weakness.  Endo/Heme/Allergies: Negative.   Psychiatric/Behavioral: Negative for depression.  All other systems reviewed and are negative.  Otherwise per HPI.  Assessment & Plan: Visit Diagnoses:  1. Spondylosis without myelopathy or radiculopathy, lumbar region   2. Chronic bilateral low back pain without sciatica   3. Chronic bilateral thoracic back pain   4. Rib pain     Plan: Findings:  Chronic worsening severe multi-somatic-type complaints of his thoracic and lumbar spine and ribs. MRI findings are mostly suggestive for facet arthropathy and fairly acute [name] edema in the peri-facet region. I think this is probably what is causing a lot of his back pain in general and maybe there  is some underlying myofascial pain as well. He is undergoing physical therapy for his right humerus which was fractured back in January. He states that the physical therapist has worked on his back 1 time and it seemed to make things worse. I have encouraged him to continue with physical therapy is a think this may help his thoracic pain to a degree. We are going to complete diagnostic hopefully therapeutic  bilateral L4-5 facet joint blocks today. I don't really have interventional procedure for his upper thoracic spine as it looks fairly normal on imaging and I think his best approach there is physical therapy and weight loss. If he does well with the facet joint blocks and at least will have a decent diagnosis of some of his pain generators. He'll continue to follow up with Dr. August Saucerean as needed. We also discussed the chances of increased blood sugars with cortisone injection. I spent more than 20 minutes speaking face-to-face with the patient with 50% of the time in counseling.    Meds & Orders:  Meds ordered this encounter  Medications  . lidocaine (PF) (XYLOCAINE) 1 % injection 2 mL  . methylPREDNISolone acetate (DEPO-MEDROL) injection 80 mg    Orders Placed This Encounter  Procedures  . Facet Injection  . XR C-ARM NO REPORT    Follow-up: Return if symptoms worsen or fail to improve.   Procedures: No procedures performed  Lumbar Facet Joint Intra-Articular Injection(s) with Fluoroscopic Guidance  Patient: [name]      Date of Birth: 03/20/1966 MRN: [id number] PCP: Lizbeth BarkHairston, Mandesia R, FNP      Visit Date: 02/28/2017   Universal Protocol:    Date/Time: 07/25/186:09 AM  Consent Given By: the patient  Position: PRONE   Additional Comments: Vital signs were monitored before and after the procedure. Patient was prepped and draped in the usual sterile fashion. The correct patient, procedure, and site was verified.   Injection Procedure Details:  Procedure Site One Meds Administered:  Meds ordered this encounter  Medications  . lidocaine (PF) (XYLOCAINE) 1 % injection 2 mL  . methylPREDNISolone acetate (DEPO-MEDROL) injection 80 mg     Laterality: Bilateral  Location/Site:  L4-L5  Needle size: 22 guage  Needle type: Spinal  Needle Placement: Articular  Findings:  -Contrast Used: 0.5 mL iohexol 180 mg iodine/mL   -Comments: Excellent flow of contrast producing  a partial arthrogram.  Procedure Details: The fluoroscope beam is vertically oriented in AP, and the inferior recess is visualized beneath the lower pole of the inferior apophyseal process, which represents the target point for needle insertion. When direct visualization is difficult the target point is located at the medial projection of the vertebral pedicle. The region overlying each aforementioned target is locally anesthetized with a 1 to 2 ml. volume of 1% Lidocaine without Epinephrine.   The spinal needle was inserted into each of the above mentioned facet joints using biplanar fluoroscopic guidance. A 0.25 to 0.5 ml. volume of Isovue-250 was injected and a partial facet joint arthrogram was obtained. A single spot film was obtained of the resulting arthrogram.    One to 1.25 ml of the steroid/anesthetic solution was then injected into each of the facet joints noted above.   Additional Comments:  The patient tolerated the procedure well No complications occurred Dressing: Band-Aid    Post-procedure details: Patient was observed during the procedure. Post-procedure instructions were reviewed.  Patient left the clinic in stable condition.     Clinical History: MRI LUMBAR SPINE WITHOUT  CONTRAST  TECHNIQUE: Multiplanar, multisequence MR imaging of the lumbar spine was performed. No intravenous contrast was administered.  COMPARISON:  Outside Lumbar radiographs 01/16/2017.  FINDINGS: Segmentation:  Normal as demonstrated on the comparison radiographs.  Alignment: Mild straightening of lumbar lordosis, but otherwise normal vertebral alignment.  Vertebrae: Heterogeneous bone marrow signal throughout the visible spine and pelvis. Heterogeneously decreased T1 marrow signal throughout. Occasional areas of preserved fatty marrow are noted in the sacrum. At the same time there is no bone destruction or marrow edema. There are occasional small chronic endplate Schmorl  nodes. Negative visible SI joints.  Conus medullaris: Extends to the L2 level and appears normal. Fairly capacious spinal canal.  Paraspinal and other soft tissues: Negative visualized abdominal viscera.  There is mild soft tissue inflammation about the chronically degenerated left L4-L5 facet joint (series 8, image 12). Other visualized posterior paraspinal soft tissues are within normal limits.  Disc levels:  T10-T11: Partially visible, appears negative.  T11-12: Mild disc desiccation and disc space loss. Mild facet hypertrophy. No definite stenosis.  T12-L1:  Negative.  L1-L2:  Negative.  L2-L3: Mild far lateral disc bulging and endplate spurring. Mild facet hypertrophy. No stenosis.  L3-L4: Mild disc desiccation. Circumferential but mostly far lateral disc bulging. Mild facet hypertrophy. No stenosis.  L4-L5: Mild far lateral disc bulging. Moderate facet hypertrophy with bilateral facet joint fluid (series 13, image 3). Mild soft tissue inflammation about the left facet as stated above. No spinal or lateral recess stenosis. Mild if any right L4 neural foraminal stenosis.  L5-S1:  Minimal disc bulge.  Mild facet hypertrophy.  No stenosis.  IMPRESSION: 1. Heterogeneous bone marrow signal throughout the visible spine and pelvis. This is abnormal but nonspecific. I favor a metabolic etiology such as anemia, smoking, or obesity - rather than a malignant marrow infiltrative process. Recommend laboratory correlation. 2. Minimal lumbar disc degeneration, and no discrete disc herniation or spinal stenosis. But there is up to moderate lumbar facet arthropathy, including mild degenerative appearing inflammation of chronically degenerated facets at L4-L5.   Electronically Signed   By: Odessa Fleming M.D.   On: 01/27/2017 08:44  He reports that he quit smoking about 5 years ago. He has never used smokeless tobacco.   Recent Labs  09/02/16 0720 12/01/16 0845   HGBA1C 11.7* 6.7    Objective:  VS:  HT:    WT:   BMI:     BP:130/87  HR:(!) 104bpm  TEMP: ( )  RESP:95 % Physical Exam  Constitutional: He is oriented to person, place, and time. He appears well-developed and well-nourished. No distress.  HENT:  Head: Normocephalic and atraumatic.  Eyes: Pupils are equal, round, and reactive to light. Conjunctivae are normal.  Neck: Normal range of motion. Neck supple.  Cardiovascular: Regular rhythm and intact distal pulses.   Pulmonary/Chest: Effort normal. No respiratory distress.  Musculoskeletal:  Patient is slow to rise from a seated position. He does have some taut bands in the lower thoracic paraspinal region. He has pain with rotation and extension of the lumbar spine. His pain really is centered at about the L3 level or below but above. He has no pain over the greater trochanters and good distal strength.  Neurological: He is alert and oriented to person, place, and time.  Skin: Skin is warm and dry. No rash noted. No erythema.  Psychiatric: He has a normal mood and affect.  Nursing note and vitals reviewed.   Ortho Exam Imaging: Xr C-arm No Report  Result Date: 02/28/2017 Please see Notes or Procedures tab for imaging impression.   Past Medical/Family/Surgical/Social History: Medications & Allergies reviewed per EMR Patient Active Problem List   Diagnosis Date Noted  . Fracture of humerus, proximal, right, closed 09/03/2016  . Fracture of humeral shaft, right, closed 09/02/2016  . Diabetes (HCC) 09/02/2016  . Essential hypertension, benign 09/02/2016   Past Medical History:  Diagnosis Date  . Diabetes mellitus without complication (HCC)   . Fracture closed, humerus    right  . Fracture of humeral shaft, right, closed 09/02/2016  . Fracture of humerus, proximal, right, closed 09/03/2016  . Hypertension    Family History  Problem Relation Age of Onset  . Sjogren's syndrome Mother   . CAD Father   . Diabetes Father     Past Surgical History:  Procedure Laterality Date  . CHOLECYSTECTOMY    . ORIF HUMERUS FRACTURE Right 09/02/2016   Procedure: OPEN REDUCTION INTERNAL FIXATION (ORIF) RIGHT  HUMERAL SHAFT FRACTURE;  Surgeon: Myrene GalasMichael Handy, MD;  [facility];  Service: Orthopedics;  Laterality: Right;  . WISDOM TOOTH EXTRACTION     Social History   Occupational History  . Not on file.   Social History Main Topics  . Smoking status: Former Smoker    Quit date: 08/09/2011  . Smokeless tobacco: Never Used  . Alcohol use Yes     Comment: 3 drinks per week  . Drug use: No  . Sexual activity: Not on file

## 2017-03-01 NOTE — Therapy (Signed)
[facility] [address], [place], [address] Phone: [phone]   Fax:  [phone]  Physical Therapy Treatment  Patient Details  Name: [name] MRN: [id number] Date of Birth: 1965/10/07 Referring Provider: Fredia Beets, FNP  Encounter Date: 03/01/2017      PT End of Session - 03/01/17 0803    Visit Number 14   Number of Visits 19   Date for PT Re-Evaluation 03/06/17   PT Start Time 0805   PT Stop Time 0846   PT Time Calculation (min) 41 min   Activity Tolerance Patient tolerated treatment well   Behavior During Therapy [facility] for tasks assessed/performed      Past Medical History:  Diagnosis Date  . Diabetes mellitus without complication ([place])   . Fracture closed, humerus    right  . Fracture of humeral shaft, right, closed 09/02/2016  . Fracture of humerus, proximal, right, closed 09/03/2016  . Hypertension     Past Surgical History:  Procedure Laterality Date  . CHOLECYSTECTOMY    . ORIF HUMERUS FRACTURE Right 09/02/2016   Procedure: OPEN REDUCTION INTERNAL FIXATION (ORIF) RIGHT  HUMERAL SHAFT FRACTURE;  Surgeon: Altamese North Hornell, MD;  Location: [place];  Service: Orthopedics;  Laterality: Right;  . WISDOM TOOTH EXTRACTION      There were no vitals filed for this visit.      Subjective Assessment - 03/01/17 0806    Subjective R shoulder is not bad. No sitting pain. Hurts a little bit when he raises his R arm. When he relaxes it, it feels better.  2-3/10 R shoulder pain when trying to raise his R arm.   Back felt uncomfortable Monday night. Got injections for his back Tuesday and is cautiously optimistic about it.  1/10 back pain today.    Pertinent History Chronic back and R shoulder pain. Pt states having R shoulder surgery on September 02, 2016 secondary to R shoulder fracture. Has not had PT for his R shoulder. Feels a constant discomfort at his arm (feels like someone is compressing his  shoulder such as there is a screw holding it together). Feels it more at the end of the day. But his R shoulder feels better since his surgery.  Pt also states difficulty raising his R shoulder due to sharp pain in the front and side.  Currently has difficulty reaching with his R arm.   Pt also states having pain in his middle back with constant pain. Had x-rays which revealed bone spurs in his vertebrae. Thinks there area compressed discs going on but has an appointment with his MD to discuss that.   Feels like his back pain has gotten worse since the past year. Pt states that his doctor did not see any fractures.  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   Pt states that his R arm does not feel like its healed but his MD says that it is and is very happy with the X-rays.  Does some exercises with the green or purple elastic band (ER, IR, flexion, extension since the 12th week mark post op). R shoulder is better compared to a month ago.  Wants to work on his shoulder today.   R hand dominant, L hand writing and eating, and cutting with a knife   Patient Stated Goals Want sort of normal range of motion for his R shoulder.    Currently in Pain? Yes   Pain Score  3    Pain Onset More than a month ago                                 PT Education - 03/01/17 9702    Education provided Yes   Education Details ther-ex, HEP   Person(s) Educated Patient   Methods Explanation;Demonstration;Tactile cues;Verbal cues;Handout   Comprehension Returned demonstration;Verbalized understanding        Objectives     R shoulder AROM: 136degrees flexion, 126degrees abduction at start of session. Has done his exercises    There-ex  Supine bilateral shoulder flexion resisting 2 lb at R hand 10x2 with5 second holds at end range to promote strength and flexion AROM. Hooklying position. And to promote thoracic mobility and decrease low back pressure.    L S/L  R shoulder ER resisting 2 lbs 10x, then 10x5 seconds for 2 sets  Supine open books 10x5 seconds for 2 setsto promote scapular retraction and pectoralis muscle stretching  Lower trap raise on table, forehead resting on forearm 10x5 seconds for 2 sets   Standing R shoulder AROM: flexion 130 degrees, abduction 126  Bilateral shoulder scaption with scapular retraction at corner wall 10x2  Low rows resisting yellow band 10x5 seconds for 2 sets to promote scapular strength and trunk muscle use.    Ball wall scaption 10x5 seconds                 Also gave supine bilateral shoulder flexion (2 lbs R shoulder) 10x3 in hooklying position as part of his HEP. Pt demonstrated and verbalized understanding.     Improved exercise technique, movement at target joints, use of target muscles after min to mod verbal, visual, tactile cues.    Improved starting R shoulder flexion and abduction AROM compared to previous session. Worked on gentle thoracic mobility (supine bilateral shoulder flexion ) and trunk muscle use to also help with back pain. Pt tolerated session well without aggravation of symptoms.           PT Long Term Goals - 02/13/17 0913      PT LONG TERM GOAL #1   Title Patient will improve R shoulder flexion and abduction AROM to at least 135 degrees to promote ability to reach with less pain.    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    Baseline 8/10 back pain at worst for the past month (01/04/2017); 5/10 mid back pain at most for the past 7 days (02/13/2017)   Time 6   Period Weeks   Status Achieved     PT LONG TERM GOAL #3   Title  Patient will improve his Quick Dash Disability/Symptom score by at least 15% as a demonstration of improved function.    Baseline 47.72% (01/04/2017); 31.8% (02/13/2017)   Time 6   Period Weeks   Status Achieved     PT LONG TERM GOAL #4   Title Patient will improve his Modified [place] Low Back Pain Disability Questionnaire score by at least 12% as a demonstration  of improved function.    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    Baseline 5/10 mid back pain at most for the past 7 days (02/13/2017)   Time 4   Period Weeks   Status New               Plan - 03/01/17 1188    Clinical Impression Statement Improved starting R shoulder flexion and abduction AROM compared to previous session. Worked on gentle thoracic mobility (supine bilateral shoulder flexion ) and trunk muscle use to also help with back pain. Pt tolerated session well without aggravation of symptoms.   History and Personal Factors relevant to plan of care: R humeral shaft fracture, S/P ORIF, back pain, finances   Clinical Presentation Stable   Clinical Presentation due to: good carry over with AROM   Clinical Decision Making Low   Rehab Potential Fair   Clinical Impairments Affecting Rehab Potential Chronicity of condition, finances/insurance   PT Frequency 2x / week   PT Duration 4 weeks   PT Treatment/Interventions Therapeutic activities;Therapeutic exercise;Patient/family education;Manual techniques;Dry needling;Neuromuscular re-education;Traction;Iontophoresis 22m/ml Dexamethasone;Electrical Stimulation;Aquatic Therapy  modalities if appropriate; traction for back if appropriate   PT Next Visit Plan scapular strengthening, R shoulder AROM/gentle strengthening as appropriate, manual techniques, hip and trunk  strengthening, posture   Consulted and Agree with Plan of Care Patient      Patient will benefit from skilled therapeutic intervention in order to improve the following deficits and impairments:  Decreased strength, Pain, Postural dysfunction, Improper body mechanics, Decreased range of motion  Visit Diagnosis: Chronic right shoulder pain  Pain in thoracic spine  Chronic low back pain, unspecified back pain laterality, with sciatica presence unspecified     Problem List Patient Active Problem List   Diagnosis Date Noted  . Fracture of humerus, proximal, right, closed 09/03/2016  . Fracture of humeral shaft, right, closed 09/02/2016  . Diabetes ([facility] 09/02/2016  . Essential hypertension, benign 09/02/2016    MJoneen BoersPT, DPT   03/01/2017, 8:39 PM  [facility] [address] [place] [address]Phone: [phone]  Fax:  [phone] Name: RAlois ColganMRN: [id number] of Birth: [birth year]

## 2017-03-01 NOTE — Procedures (Signed)
Lumbar Facet Joint Intra-Articular Injection(s) with Fluoroscopic Guidance  Patient: [name]      Date of Birth: Oct 04, 1965 MRN: [id number] PCP: [name], [name]      Visit Date: 02/28/2017   Universal Protocol:    Date/Time: 07/25/186:09 AM  Consent Given By: the patient  Position: PRONE   Additional Comments: Vital signs were monitored before and after the procedure. Patient was prepped and draped in the usual sterile fashion. The correct patient, procedure, and site was verified.   Injection Procedure Details:  Procedure Site One Meds Administered:  Meds ordered this encounter  Medications  . lidocaine (PF) (XYLOCAINE) 1 % injection 2 mL  . methylPREDNISolone acetate (DEPO-MEDROL) injection 80 mg     Laterality: Bilateral  Location/Site:  L4-L5  Needle size: 22 guage  Needle type: Spinal  Needle Placement: Articular  Findings:  -Contrast Used: 0.5 mL iohexol 180 mg iodine/mL   -Comments: Excellent flow of contrast producing a partial arthrogram.  Procedure Details: The fluoroscope beam is vertically oriented in AP, and the inferior recess is visualized beneath the lower pole of the inferior apophyseal process, which represents the target point for needle insertion. When direct visualization is difficult the target point is located at the medial projection of the vertebral pedicle. The region overlying each aforementioned target is locally anesthetized with a 1 to 2 ml. volume of 1% Lidocaine without Epinephrine.   The spinal needle was inserted into each of the above mentioned facet joints using biplanar fluoroscopic guidance. A 0.25 to 0.5 ml. volume of Isovue-250 was injected and a partial facet joint arthrogram was obtained. A single spot film was obtained of the resulting arthrogram.    One to 1.25 ml of the steroid/anesthetic solution was then injected into each of the facet joints noted above.   Additional Comments:  The patient tolerated the  procedure well No complications occurred Dressing: Band-Aid    Post-procedure details: Patient was observed during the procedure. Post-procedure instructions were reviewed.  Patient left the clinic in stable condition.

## 2017-03-01 NOTE — Patient Instructions (Addendum)
  EXTERNAL ROTATION: Side-Lying (Active)    Lie on left side, top arm bent to 90, elbow against side, hand forward. Rotate forearm up as high as possible. Use __2_ lbs. Complete _3__ sets of _10__ repetitions. Perform __1_ sessions per day.  Copyright  VHI. All rights reserved.      Lower trap raise on the table.   Resting your forehead on your forearm on your table,    Raise your right arm up diagonally and squeeze your right shoulder blade back comfortably.    Hold for 5 seconds.    Repeat 10 times   Perform 3 sets daily.       Low rows  Standing with band attached to the door   Pull the band back to the front of your hips with both hands   Squeeze your shoulder blades together at the same time    Hold for 5 seconds   Repeat 10 times   Perform 2 sets daily     Also gave supine bilateral shoulder flexion (2 lbs R shoulder) 10x3 in hooklying position as part of his HEP. Pt demonstrated and verbalized understanding.

## 2017-03-06 ENCOUNTER — Ambulatory Visit: Payer: No Typology Code available for payment source

## 2017-03-06 DIAGNOSIS — M25511 Pain in right shoulder: Principal | ICD-10-CM

## 2017-03-06 DIAGNOSIS — M546 Pain in thoracic spine: Secondary | ICD-10-CM

## 2017-03-06 DIAGNOSIS — G8929 Other chronic pain: Secondary | ICD-10-CM

## 2017-03-06 DIAGNOSIS — M545 Low back pain: Secondary | ICD-10-CM

## 2017-03-06 NOTE — Therapy (Signed)
[facility] [address], [place], [address] Phone: [phone]   Fax:  [phone]  Physical Therapy Treatment  Patient Details  Name: [name] MRN: [id number] Date of Birth: [birth year] Referring Provider: Fredia Beets, FNP  Encounter Date: 03/06/2017      PT End of Session - 03/06/17 0805    Visit Number 15   Number of Visits 27   Date for PT Re-Evaluation 04/06/17   PT Start Time 0805   PT Stop Time 8242   PT Time Calculation (min) 50 min   Activity Tolerance Patient tolerated treatment well   Behavior During Therapy [facility] for tasks assessed/performed      Past Medical History:  Diagnosis Date  . Diabetes mellitus without complication ([place])   . Fracture closed, humerus    right  . Fracture of humeral shaft, right, closed 09/02/2016  . Fracture of humerus, proximal, right, closed 09/03/2016  . Hypertension     Past Surgical History:  Procedure Laterality Date  . CHOLECYSTECTOMY    . ORIF HUMERUS FRACTURE Right 09/02/2016   Procedure: OPEN REDUCTION INTERNAL FIXATION (ORIF) RIGHT  HUMERAL SHAFT FRACTURE;  Surgeon: Altamese [place], MD;  Location: [place];  Service: Orthopedics;  Laterality: Right;  . WISDOM TOOTH EXTRACTION      There were no vitals filed for this visit.      Subjective Assessment - 03/06/17 0807    Subjective R shoulder feels fine. No pain at rest. Reaching is a little bit better every day. No pain unless I push it further.  Back is so so. Had a really really busy week. 1-2/10 back pain currently. I don't think the procedure helped.  Back is usually better in the morning, 7/10 after a long work day. It just hurts. Feels a muslce strain too.    Pertinent History Chronic back and R shoulder pain. Pt states having R shoulder surgery on September 02, 2016 secondary to R shoulder fracture. Has not had PT for his R shoulder. Feels a constant discomfort at his arm (feels like someone  is compressing his shoulder such as there is a screw holding it together). Feels it more at the end of the day. But his R shoulder feels better since his surgery.  Pt also states difficulty raising his R shoulder due to sharp pain in the front and side.  Currently has difficulty reaching with his R arm.   Pt also states having pain in his middle back with constant pain. Had x-rays which revealed bone spurs in his vertebrae. Thinks there area compressed discs going on but has an appointment with his MD to discuss that.   Feels like his back pain has gotten worse since the past year. Pt states that his doctor did not see any fractures.  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   Pt states that his R arm does not feel like its healed but his MD says that it is and is very happy with the X-rays.  Does some exercises with the green or purple elastic band (ER, IR, flexion, extension since the 12th week mark post op). R shoulder is better compared to a month ago.  Wants to work on his shoulder today.   R hand dominant, L hand writing and eating, and cutting with a knife   Patient Stated Goals Want sort of normal range of motion for his R shoulder.    Currently in Pain?  Yes   Pain Score 2   back pain   Pain Onset More than a month ago                                 PT Education - 03/06/17 0840    Education provided Yes   Education Details ther-ex, HEP   Person(s) Educated Patient   Methods Explanation;Demonstration;Tactile cues;Verbal cues   Comprehension Returned demonstration;Verbalized understanding        Objectives     R shoulder AROM: 140degrees flexion, 127degrees abduction at start of session.    Manual therapy   Sitting with R arm propped on scaption: STM R teres major STM R latissimus dorsi musle   R shoulder AROM: 145 degrees flexion, 131 degrees abduction   Seated bent over, pt resting forehead onto forearm  R UPA to around  T10, T9, T8 transverse processes grade 3- to 3     There-ex   R shoulder fleixon and abduction AROM multiple times  (-) kidney thump R  Sitting with Lumbar towel roll x 3 min   Finds some relief.   Low rows resisting red band 10x5 seconds for 2 sets to promote scapular strength and trunk muscle use.   Sitting on dyna disc on chair  Feels relief    Then with gentle manual perturbation from PT x 2 min   Then with seated hip adduction ball squeeze with glute max squeeze 10x5 seconds  Reviewed plan of care: 2x/week x 4 weeks to continue working on back and shoulder   Improved exercise technique, movement at target joints, use of target muscles after min to mod verbal, visual, tactile cues.    Improving R shoulder flexion and scaption AROM overall especially after STM to R terres major and latissimus dorsi muscles and with exercises. Decreased back pain with gentle lumbar extension and core muscle activation. Decreased spinal pain after session. Pt still demonstrates back pain, R UE weakness, limited R shoulder AROM (but improving), and difficulty performing functional tasks such as cooking at work due to back pain and reaching due to R shoulder weakness, and limited AROM. Patient will benefit from continued skilled physical therapy services to address the aforementioned deficits.           PT Long Term Goals - 03/06/17 1207      PT LONG TERM GOAL #1   Title Patient will improve R shoulder flexion and abduction AROM to at least 135 degrees to promote ability to reach with less pain.    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    Baseline 8/10 back pain at worst for the past month (01/04/2017); 5/10 mid back pain at most for the past 7 days (02/13/2017); 7/10 at worst (03/06/2017)   Time 6   Period Weeks   Status On-going   Target Date 04/06/17     PT LONG TERM GOAL #3   Title Patient will improve  his [place]ina Dung Disability/Symptom score by at least 15% as a demonstration of improved function.    Baseline 47.72% (01/04/2017); 31.8% (02/13/2017)   Time 6   Period Weeks   Status Achieved     PT LONG TERM GOAL #4   Title Patient will improve his Modified [place] Low Back Pain Disability Questionnaire score by at least 12% as a demonstration of improved function.    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    Baseline 5/10 mid back pain at most for the past 7 days (02/13/2017); 7/10 at worst (03/06/2017)   Time 4   Period Weeks   Status New   Target Date 04/06/17               Plan - 03/06/17 0841    Clinical Impression Statement Improving R shoulder flexion and scaption AROM overall especially after STM to R terres major and latissimus dorsi muscles and with exercises. Decreased back pain with gentle lumbar extension and core muscle activation. Decreased spinal pain after session. Pt still demonstrates back pain, R UE weakness, limited R shoulder AROM (but improving), and difficulty performing functional tasks such as cooking at work due to back pain and reaching due to R shoulder weakness, and limited AROM. Patient will benefit from continued skilled physical therapy services to address the aforementioned deficits.    History and Personal Factors relevant to plan of care: R humeral shaft fracture, S/P ORIF, back pain, finances    Clinical Presentation Stable   Clinical Presentation due to: Good carry over of R shoulder AROM progress. Pt still however has back pain.    Clinical Decision Making Low   Rehab Potential Fair   Clinical Impairments Affecting Rehab Potential Chronicity of condition, finances/insurance   PT Frequency 2x / week   PT Duration 4 weeks   PT Treatment/Interventions Therapeutic activities;Therapeutic exercise;Patient/family education;Manual techniques;Dry needling;Neuromuscular re-education;Traction;Iontophoresis 47m/ml Dexamethasone;Electrical Stimulation;Aquatic Therapy  modalities if appropriate; traction for back if appropriate   PT Next Visit Plan scapular strengthening, R shoulder AROM/gentle strengthening as appropriate, manual techniques, hip and trunk strengthening, posture   Consulted and Agree with Plan of Care Patient      Patient will benefit from skilled therapeutic intervention in order to improve the following deficits and impairments:  Decreased strength, Pain, Postural dysfunction, Improper body mechanics, Decreased range of motion  Visit Diagnosis: Chronic right shoulder pain - Plan: PT plan of care cert/re-cert  Chronic low back pain, unspecified back pain laterality, with sciatica presence unspecified - Plan: PT plan of care cert/re-cert  Pain in thoracic spine - Plan: PT plan of care cert/re-cert     Problem List Patient Active Problem List   Diagnosis Date Noted  . Fracture of humerus, proximal, right, closed 09/03/2016  . Fracture of humeral shaft, right, closed 09/02/2016  . Diabetes ([place] 09/02/2016  . Essential hypertension, benign 09/02/2016    MJoneen BoersPT, DPT   03/06/2017, 12:25 PM  [facility] [address] [place] [address]Phone: [phone]  Fax:  [phone] Name: RTecumseh YeagleyMRN: [id number] of Birth: 905/28/1967

## 2017-03-06 NOTE — Patient Instructions (Signed)
   Sit down on a chair with a lumbar towel roll for 2 minutes

## 2017-03-08 ENCOUNTER — Ambulatory Visit: Payer: No Typology Code available for payment source | Attending: Family Medicine

## 2017-03-08 DIAGNOSIS — M546 Pain in thoracic spine: Secondary | ICD-10-CM

## 2017-03-08 DIAGNOSIS — M545 Low back pain: Secondary | ICD-10-CM | POA: Insufficient documentation

## 2017-03-08 DIAGNOSIS — M25511 Pain in right shoulder: Secondary | ICD-10-CM | POA: Insufficient documentation

## 2017-03-08 DIAGNOSIS — G8929 Other chronic pain: Secondary | ICD-10-CM | POA: Insufficient documentation

## 2017-03-08 NOTE — Therapy (Signed)
[facility] [address], [place], [address] Phone: [phone]   Fax:  [phone]  Physical Therapy Treatment  Patient Details  Name: [name] MRN: [id number] Date of Birth: 27-Aug-1965 Referring Provider: Fredia Beets, FNP  Encounter Date: 03/08/2017      PT End of Session - 03/08/17 0848    Visit Number 16   Number of Visits 27   Date for PT Re-Evaluation 04/06/17   PT Start Time 0848   PT Stop Time 0936   PT Time Calculation (min) 48 min   Activity Tolerance Patient tolerated treatment well   Behavior During Therapy [facility] for tasks assessed/performed      Past Medical History:  Diagnosis Date  . Diabetes mellitus without complication ([place])   . Fracture closed, humerus    right  . Fracture of humeral shaft, right, closed 09/02/2016  . Fracture of humerus, proximal, right, closed 09/03/2016  . Hypertension     Past Surgical History:  Procedure Laterality Date  . CHOLECYSTECTOMY    . ORIF HUMERUS FRACTURE Right 09/02/2016   Procedure: OPEN REDUCTION INTERNAL FIXATION (ORIF) RIGHT  HUMERAL SHAFT FRACTURE;  Surgeon: Altamese [place], MD;  Location: [place];  Service: Orthopedics;  Laterality: Right;  . WISDOM TOOTH EXTRACTION      There were no vitals filed for this visit.      Subjective Assessment - 03/08/17 0850    Subjective Pt states that his whole spine feels tired today. 2/10 currently. Back was not bad after last session. Pt states that he cautously feels a little bit better since last visit.  Does not think that the shot for his back helped.    Pertinent History Chronic back and R shoulder pain. Pt states having R shoulder surgery on September 02, 2016 secondary to R shoulder fracture. Has not had PT for his R shoulder. Feels a constant discomfort at his arm (feels like someone is compressing his shoulder such as there is a screw holding it together). Feels it more at the end of the day.  But his R shoulder feels better since his surgery.  Pt also states difficulty raising his R shoulder due to sharp pain in the front and side.  Currently has difficulty reaching with his R arm.   Pt also states having pain in his middle back with constant pain. Had x-rays which revealed bone spurs in his vertebrae. Thinks there area compressed discs going on but has an appointment with his MD to discuss that.   Feels like his back pain has gotten worse since the past year. Pt states that his doctor did not see any fractures.  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   Pt states that his R arm does not feel like its healed but his MD says that it is and is very happy with the X-rays.  Does some exercises with the green or purple elastic band (ER, IR, flexion, extension since the 12th week mark post op). R shoulder is better compared to a month ago.  Wants to work on his shoulder today.   R hand dominant, L hand writing and eating, and cutting with a knife   Patient Stated Goals Want sort of normal range of motion for his R shoulder.    Currently in Pain? Yes   Pain Score 2    Pain Onset More than a month ago  PT Education - 03/08/17 [phone]    Education provided Yes   Education Details ther-ex   Person(s) Educated Patient   Methods Explanation;Demonstration;Tactile cues;Verbal cues;Handout   Comprehension Returned demonstration;Verbalized understanding         Objectives     R shoulder AROM: 140degrees flexion, 127degrees abduction at start of session.    Manual therapy  Seated bent over, pt resting forehead onto forearm             R UPA to around T12, T11, T10, T9, T8, T7, T6  transverse processes grade 3- to 3 to promote movement and decrease stiffness.   Pt states back feels cautiously better   There-ex  Sitting with lumbar towel roll.   Sitting on dyna disc on chair            1 min                Then with gentle manual perturbation from PT x 2 min              Then with seated hip adduction ball squeeze with glute max squeeze 10x5 seconds for 2 sets   Then with low rows resisting yellow band 10x5 seconds for 3 sets  Standing leg press resisting double blue band with bilateral UE assist 10x3 each LE       Improved exercise technique, movement at target joints, use of target muscles after mod verbal, visual, tactile cues.     Improved thoracic spine mobility with slight decrease in back discomfort after manual therapy. Worked on core strengthening following joint mobilization (to promote movement and decrease stiffness) to promote control. Pt tolerated session well without aggravation of symptoms. Decreased back pain to 1/10 after session.           PT Long Term Goals - 03/06/17 1207      PT LONG TERM GOAL #1   Title Patient will improve R shoulder flexion and abduction AROM to at least 135 degrees to promote ability to reach with less pain.    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    Baseline 8/10 back pain at worst for the past month (01/04/2017); 5/10 mid back pain at most for the past 7 days (02/13/2017); 7/10 at worst (03/06/2017)   Time 6   Period Weeks   Status On-going   Target Date 04/06/17     PT LONG TERM GOAL #3   Title Patient will improve his [place] Disability/Symptom score by at least 15% as a demonstration of improved function.    Baseline 47.72% (01/04/2017); 31.8% (02/13/2017)   Time 6   Period Weeks    Status Achieved     PT LONG TERM GOAL #4   Title Patient will improve his Modified [place] Low Back Pain Disability Questionnaire score by at least 12% as a demonstration of improved function.    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    Baseline 5/10 mid back pain at most for the past 7 days (02/13/2017); 7/10 at worst (03/06/2017)   Time 4   Period Weeks   Status New   Target Date 04/06/17               Plan - 03/08/17 3790    Clinical Impression Statement Improved thoracic spine mobility with slight decrease in back discomfort after manual therapy. Worked on core strengthening following joint mobilization (to promote movement and decrease stiffness) to promote control. Pt tolerated session well without aggravation of symptoms. Decreased back pain to 1/10 after session.    History and Personal Factors relevant to plan of care: R humeral shaft fracture, S/P ORIF, back pain, finances   Clinical Presentation Stable   Clinical Presentation due to: Slight decrease in back pain for the past 2 sessions   Clinical Decision Making Low   Rehab Potential Fair   Clinical Impairments Affecting Rehab Potential Chronicity of condition, finances/insurance   PT Frequency 2x / week   PT Duration 4 weeks   PT Treatment/Interventions Therapeutic activities;Therapeutic exercise;Patient/family education;Manual techniques;Dry needling;Neuromuscular re-education;Traction;Iontophoresis 60m/ml Dexamethasone;Electrical Stimulation;Aquatic Therapy  modalities if appropriate; traction for back if appropriate   PT Next Visit Plan scapular strengthening, R shoulder AROM/gentle strengthening as appropriate, manual techniques, hip and trunk strengthening, posture   Consulted and Agree with Plan of Care Patient       Patient will benefit from skilled therapeutic intervention in order to improve the following deficits and impairments:  Decreased strength, Pain, Postural dysfunction, Improper body mechanics, Decreased range of motion  Visit Diagnosis: Chronic right shoulder pain  Chronic low back pain, unspecified back pain laterality, with sciatica presence unspecified  Pain in thoracic spine     Problem List Patient Active Problem List   Diagnosis Date Noted  . Fracture of humerus, proximal, right, closed 09/03/2016  . Fracture of humeral shaft, right, closed 09/02/2016  . Diabetes ([place] 09/02/2016  . Essential hypertension, benign 09/02/2016    MJoneen BoersPT, DPT   03/08/2017, 9:50 AM  [facility] [address] [place] [address]Phone: [phone]  Fax:  [phone] Name: Shaun KenyonMRN: [id number] of Birth: 9November 26, 1967

## 2017-03-15 ENCOUNTER — Ambulatory Visit: Payer: No Typology Code available for payment source

## 2017-03-21 ENCOUNTER — Ambulatory Visit: Payer: No Typology Code available for payment source

## 2017-03-21 DIAGNOSIS — M546 Pain in thoracic spine: Secondary | ICD-10-CM

## 2017-03-21 DIAGNOSIS — M545 Low back pain: Secondary | ICD-10-CM

## 2017-03-21 DIAGNOSIS — G8929 Other chronic pain: Secondary | ICD-10-CM

## 2017-03-21 NOTE — Patient Instructions (Addendum)
Seated low rows   Sitting on a chair,   Pull the theraband to your belly button, one band on each hand   Hold for 5 seconds   Repeat 10 times   Perform 3 sets daily.       Pt was also recommended to use a lumbar towel roll when sitting to help with his back. Pt verbalized understanding.

## 2017-03-21 NOTE — Therapy (Signed)
[facility] [address] [place], [address] Phone: [phone]   Fax:  [phone]  Physical Therapy Treatment  Patient Details  Name: [name] MRN: [id number] Date of Birth: 1965-12-16 Referring Provider: Fredia Beets, FNP  Encounter Date: 03/21/2017      PT End of Session - 03/21/17 0810    Visit Number 17   Number of Visits 27   Date for PT Re-Evaluation 04/06/17   PT Start Time 0811   PT Stop Time 0902   PT Time Calculation (min) 51 min   Activity Tolerance Patient tolerated treatment well   Behavior During Therapy [facility] for tasks assessed/performed      Past Medical History:  Diagnosis Date  . Diabetes mellitus without complication ([place])   . Fracture closed, humerus    right  . Fracture of humeral shaft, right, closed 09/02/2016  . Fracture of humerus, proximal, right, closed 09/03/2016  . Hypertension     Past Surgical History:  Procedure Laterality Date  . CHOLECYSTECTOMY    . ORIF HUMERUS FRACTURE Right 09/02/2016   Procedure: OPEN REDUCTION INTERNAL FIXATION (ORIF) RIGHT  HUMERAL SHAFT FRACTURE;  Surgeon: Altamese Goreville, MD;  Location: [place];  Service: Orthopedics;  Laterality: Right;  . WISDOM TOOTH EXTRACTION      There were no vitals filed for this visit.      Subjective Assessment - 03/21/17 0812    Subjective Pt states that he was not feeling well last week. Had diarrhea and vomiting. Last episode was last Thursday.   Back is not bad, 1/10 currently.  Was ok after last session.  R shoulder is not bad, it's never in too much pain unless he stresses it. Feels like the area where the bone was broken hurts constantly.  It does not feel stable.    Pertinent History Chronic back and R shoulder pain. Pt states having R shoulder surgery on September 02, 2016 secondary to R shoulder fracture. Has not had PT for his R shoulder. Feels a constant discomfort at his arm (feels like someone is  compressing his shoulder such as there is a screw holding it together). Feels it more at the end of the day. But his R shoulder feels better since his surgery.  Pt also states difficulty raising his R shoulder due to sharp pain in the front and side.  Currently has difficulty reaching with his R arm.   Pt also states having pain in his middle back with constant pain. Had x-rays which revealed bone spurs in his vertebrae. Thinks there area compressed discs going on but has an appointment with his MD to discuss that.   Feels like his back pain has gotten worse since the past year. Pt states that his doctor did not see any fractures.  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   Pt states that his R arm does not feel like its healed but his MD says that it is and is very happy with the X-rays.  Does some exercises with the green or purple elastic band (ER, IR, flexion, extension since the 12th week mark post op). R shoulder is better compared to a month ago.  Wants to work on his shoulder today.   R hand dominant, L hand writing and eating, and cutting with a knife   Patient Stated Goals Want sort of normal range of motion for his R shoulder.    Currently in Pain?  Yes   Pain Score 1   back pain   Pain Onset More than a month ago                                 PT Education - 03/21/17 0847    Education provided Yes   Education Details ther-ex, HEP   Person(s) Educated Patient   Methods Explanation;Demonstration;Tactile cues;Verbal cues   Comprehension Verbalized understanding;Returned demonstration        Objectives   Temperature: 98.6 degrees   Pt was recommended to see his surgeon if his arm continues to bother him at the fracture site. Pt    Manual therapy  Seated bent over, pt resting forehead onto forearm R UPA to around T12, T11, T10, T9, T8, T7, T6  transverse processes grade 3 to promote movement and decrease stiffness.   Pt  states back feels a little better.      There-ex   Sitting on dyna disc on chair  Then with gentle manual perturbation from PT 3x 1 min   Rows (regular) resisting yellow band 10x3 with 5 second holds   Then with seated hip adduction ball squeeze with glute max squeeze 10x5 seconds for 2 sets              Then with low rows resisting yellow band 10x5 seconds for 2 sets  Pt was recommended to take rest breaks at work to help allow his back to recover from stress.   Sitting with lumbar towel roll. Felt good for pt.    Improved exercise technique, movement at target joints, use of target muscles after min to mod verbal, visual, tactile cues.     Decreased back pain after manual therapy to promote mobility to thoracic spine. Continued working on thoracic extension and trunk muscle activation to help decrease back pain, especially when performing standing cooking tasks at work.         PT Long Term Goals - 03/06/17 1207      PT LONG TERM GOAL #1   Title Patient will improve R shoulder flexion and abduction AROM to at least 135 degrees to promote ability to reach with less pain.    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    Baseline 8/10 back pain at worst for the past month (01/04/2017); 5/10 mid back pain at most for the past 7 days (02/13/2017); 7/10 at worst (03/06/2017)   Time 6   Period Weeks   Status On-going   Target Date 04/06/17     PT LONG TERM GOAL #3   Title Patient will improve his Quick Dash  Disability/Symptom score by at least 15% as a demonstration of improved function.    Baseline 47.72% (01/04/2017); 31.8% (02/13/2017)   Time 6   Period Weeks   Status Achieved     PT LONG TERM GOAL #4   Title Patient will improve his Modified [place] Low Back Pain Disability  Questionnaire score by at least 12% as a demonstration of improved function.    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    Baseline 5/10 mid back pain at most for the past 7 days (02/13/2017); 7/10 at worst (03/06/2017)   Time 4   Period Weeks   Status New   Target Date 04/06/17               Plan - 03/21/17 0848    Clinical Impression Statement Decreased back pain after manual therapy to promote mobility to thoracic spine. Continued working on thoracic extension and trunk muscle activation to help decrease back pain, especially when performing standing cooking tasks at work.    History and Personal Factors relevant to plan of care: R humeral shaft fracture, S/P ORIF, back pain, finances   Clinical Presentation Stable   Clinical Presentation due to: slight decrease in back pain after manual therapy to decrease stiffness.    Clinical Decision Making Low   Rehab Potential Fair   Clinical Impairments Affecting Rehab Potential Chronicity of condition, finances/insurance   PT Frequency 2x / week   PT Duration 4 weeks   PT Treatment/Interventions Therapeutic activities;Therapeutic exercise;Patient/family education;Manual techniques;Dry needling;Neuromuscular re-education;Traction;Iontophoresis 90m/ml Dexamethasone;Electrical Stimulation;Aquatic Therapy  modalities if appropriate; traction for back if appropriate   PT Next Visit Plan scapular strengthening, R shoulder AROM/gentle strengthening as appropriate, manual  techniques, hip and trunk strengthening, posture   Consulted and Agree with Plan of Care Patient      Patient will benefit from skilled therapeutic intervention in order to improve the following deficits and impairments:  Decreased strength, Pain, Postural dysfunction, Improper body mechanics, Decreased range of motion  Visit Diagnosis: Pain in thoracic spine  Chronic low back pain, unspecified back pain laterality, with sciatica presence unspecified     Problem List Patient Active Problem List   Diagnosis Date Noted  . Fracture of humerus, proximal, right, closed 09/03/2016  . Fracture of humeral shaft, right, closed 09/02/2016  . [place] 09/02/2016  . Essential hypertension, benign 09/02/2016    MJoneen BoersPT, DPT   03/21/2017, 9:12 AM  [facility] [address] [place] [address]Phone: [phone]  Fax:  [phone] Name: Shaun DeryMRN: [id number] of Birth: [birth year]

## 2017-03-23 ENCOUNTER — Ambulatory Visit: Payer: No Typology Code available for payment source

## 2017-03-27 ENCOUNTER — Ambulatory Visit: Payer: No Typology Code available for payment source

## 2017-03-27 DIAGNOSIS — M25511 Pain in right shoulder: Secondary | ICD-10-CM

## 2017-03-27 DIAGNOSIS — G8929 Other chronic pain: Secondary | ICD-10-CM

## 2017-03-27 DIAGNOSIS — M546 Pain in thoracic spine: Secondary | ICD-10-CM

## 2017-03-27 DIAGNOSIS — M545 Low back pain: Principal | ICD-10-CM

## 2017-03-27 NOTE — Therapy (Signed)
[facility] [address], [place], [address] Phone: [phone]   Fax:  [phone]  Physical Therapy Treatment  Patient Details  Name: [name] MRN: [id number] Date of Birth: Mar 07, 1966 Referring Provider: Fredia Beets, FNP  Encounter Date: 03/27/2017      PT End of Session - 03/27/17 0800    Visit Number 18   Number of Visits 27   Date for PT Re-Evaluation 04/06/17   PT Start Time 0801   PT Stop Time 0849   PT Time Calculation (min) 48 min   Activity Tolerance Patient tolerated treatment well   Behavior During Therapy [facility] for tasks assessed/performed      Past Medical History:  Diagnosis Date  . Diabetes mellitus without complication ([place])   . Fracture closed, humerus    right  . Fracture of humeral shaft, right, closed 09/02/2016  . Fracture of humerus, proximal, right, closed 09/03/2016  . Hypertension     Past Surgical History:  Procedure Laterality Date  . CHOLECYSTECTOMY    . ORIF HUMERUS FRACTURE Right 09/02/2016   Procedure: OPEN REDUCTION INTERNAL FIXATION (ORIF) RIGHT  HUMERAL SHAFT FRACTURE;  Surgeon: Altamese Elkton, MD;  Location: [place];  Service: Orthopedics;  Laterality: Right;  . WISDOM TOOTH EXTRACTION      There were no vitals filed for this visit.      Subjective Assessment - 03/27/17 0802    Subjective Back is alright. Had a heavy weekend. 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 2/10 back pain currently.  R shoulder is not bad, no pain currently, it was good Friday and Saturday.    Pertinent History Chronic back and R shoulder pain. Pt states having R shoulder surgery on September 02, 2016 secondary to R shoulder fracture. Has not had PT for his R shoulder. Feels a constant discomfort at his arm (feels like someone is compressing his shoulder such as there is  a screw holding it together). Feels it more at the end of the day. But his R shoulder feels better since his surgery.  Pt also states difficulty raising his R shoulder due to sharp pain in the front and side.  Currently has difficulty reaching with his R arm.   Pt also states having pain in his middle back with constant pain. Had x-rays which revealed bone spurs in his vertebrae. Thinks there area compressed discs going on but has an appointment with his MD to discuss that.   Feels like his back pain has gotten worse since the past year. Pt states that his doctor did not see any fractures.  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   Pt states that his R arm does not feel like its healed but his MD says that it is and is very happy with the X-rays.  Does some exercises with the green or purple elastic band (ER, IR, flexion, extension since the 12th week mark post op). R shoulder is better compared to a month ago.  Wants to work on his shoulder today.   R hand dominant, L hand writing and eating, and cutting with a knife   Patient Stated Goals Want sort of normal range of motion for his R shoulder.    Currently in Pain? Yes   Pain Score 2   back pain  Pain Onset More than a month ago                                 PT Education - 03/27/17 0837    Education provided Yes   Education Details ther-ex   [employer]) Educated Patient   Methods Explanation;Demonstration;Tactile cues;Verbal cues   Comprehension Returned demonstration;Verbalized understanding        Objectives    Manual therapy   Seated bent over, pt resting forehead onto forearm R UPA to around T12, T11, T10, T9, T8, T7, T6 transverse processes grade 3 to promote movement and decrease stiffness.   spherical mass, not firm palpated R lumbar paraspinal area around L1. Pt was recommended to ask his MD about it. Pt verbalized understanding.               Pt states back feels a  little better afterwards.   STM R terres major muscle with R arm propped in scaption   There-ex   Sitting on dyna disc on chair                Rows (regular) resisting red band 10x3   Then with seated hip adduction ball squeeze with glute max squeeze 10x5 seconds for 2 sets  Then with gentle manual perturbation from PT 3x 1 min  Then with low rows resisting red band 10x2    Improved exercise technique, movement at target joints, use of target muscles after min to mod verbal, visual, tactile cues.    Decreased back pain with increased mobility to thoracic spine. Decreased back pain overall after session. Spherical mass, not firm, palpated around his R L1 area. Pt was recommended to ask his MD about it, pt verbalized understanding. Continued working on scapular strength, thoracic extension and trunk muscle activation to improve back symptoms, and improve ability to raise his arm with less pain.              PT Long Term Goals - 03/06/17 1207      PT LONG TERM GOAL #1   Title Patient will improve R shoulder flexion and abduction AROM to at least 135 degrees to promote ability to reach with less pain.    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    Baseline 8/10 back pain at worst for the past month (01/04/2017); 5/10 mid back pain at most for the past 7 days (02/13/2017); 7/10 at worst (03/06/2017)   Time 6   Period Weeks   Status On-going   Target Date 04/06/17     PT LONG TERM GOAL #3   Title  Patient will improve his Quick Dash Disability/Symptom score by at least 15% as a demonstration of improved function.    Baseline 47.72% (01/04/2017); 31.8% (02/13/2017)   Time 6   Period Weeks   Status Achieved     PT LONG TERM GOAL #4   Title Patient will improve his Modified [place]  Low Back Pain Disability Questionnaire score by at least 12% as a demonstration of improved function.    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    Baseline 5/10 mid back pain at most for the past 7 days (02/13/2017); 7/10 at worst (03/06/2017)   Time 4   Period Weeks   Status New   Target Date 04/06/17               Plan - 03/27/17 0759    Clinical Impression Statement Decreased back pain with increased mobility to thoracic spine. Decreased back pain overall after session. Spherical mass, not firm, palpated around his R L1 area. Pt was recommended to ask his MD about it, pt verbalized understanding. Continued working on scapular strength, thoracic extension and trunk muscle activation to improve back symptoms, and improve ability to raise his arm with less pain.    History and Personal Factors relevant to plan of care: R humeral shaft fracture, S/P ORIF, back pain, finances   Clinical Presentation Stable   Clinical Presentation due to: Decreased back pain (slight) with improved thoracic movement.    Clinical Decision Making Low   Rehab Potential Fair   Clinical Impairments Affecting Rehab Potential Chronicity of condition, finances/insurance   PT Frequency 2x / week   PT Duration 4 weeks   PT Treatment/Interventions Therapeutic activities;Therapeutic exercise;Patient/family education;Manual techniques;Dry needling;Neuromuscular re-education;Traction;Iontophoresis 43m/ml Dexamethasone;Electrical  Stimulation;Aquatic Therapy  modalities if appropriate; traction for back if appropriate   PT Next Visit Plan scapular strengthening, R shoulder AROM/gentle strengthening as appropriate, manual techniques, hip and trunk strengthening, posture   Consulted and Agree with Plan of Care Patient      Patient will benefit from skilled therapeutic intervention in order to improve the following deficits and impairments:  Decreased strength, Pain, Postural dysfunction, Improper body mechanics, Decreased range of motion  Visit Diagnosis: Chronic low back pain, unspecified back pain laterality, with sciatica presence unspecified  Pain in thoracic spine  Chronic right shoulder pain     Problem List Patient Active Problem List   Diagnosis Date Noted  . Fracture of humerus, proximal, right, closed 09/03/2016  . Fracture of humeral shaft, right, closed 09/02/2016  . Diabetes ([place] 09/02/2016  . Essential hypertension, benign 09/02/2016    MJoneen BoersPT, DPT   03/27/2017, 9:12 AM  [facility] [address] [place] [address]Phone: [phone]  Fax:  [phone] Name: RChristie CopleyMRN: [id number] of Birth: [birth year]

## 2017-03-28 MED FILL — GABAPENTIN 300 MG CAPSULE: 300 | 30 days supply | Qty: 30 | Fill #6

## 2017-03-30 ENCOUNTER — Ambulatory Visit: Payer: No Typology Code available for payment source

## 2017-03-30 DIAGNOSIS — M545 Low back pain: Principal | ICD-10-CM

## 2017-03-30 DIAGNOSIS — G8929 Other chronic pain: Secondary | ICD-10-CM

## 2017-03-30 DIAGNOSIS — M546 Pain in thoracic spine: Secondary | ICD-10-CM

## 2017-03-30 NOTE — Therapy (Signed)
[facility] [address] [place], [address] Phone: [phone]   Fax:  [phone]  Physical Therapy Treatment  Patient Details  Name: [name] MRN: [id number] Date of Birth: [birth year] Referring Provider: Fredia Beets, FNP  Encounter Date: 03/30/2017      PT End of Session - 03/30/17 0805    Visit Number 19   Number of Visits 27   Date for PT Re-Evaluation 04/06/17   PT Start Time 0805   PT Stop Time 0853   PT Time Calculation (min) 48 min   Activity Tolerance Patient tolerated treatment well   Behavior During Therapy [facility] for tasks assessed/performed      Past Medical History:  Diagnosis Date  . Diabetes mellitus without complication ([place])   . Fracture closed, humerus    right  . Fracture of humeral shaft, right, closed 09/02/2016  . Fracture of humerus, proximal, right, closed 09/03/2016  . Hypertension     Past Surgical History:  Procedure Laterality Date  . CHOLECYSTECTOMY    . ORIF HUMERUS FRACTURE Right 09/02/2016   Procedure: OPEN REDUCTION INTERNAL FIXATION (ORIF) RIGHT  HUMERAL SHAFT FRACTURE;  Surgeon: Altamese Ranger, MD;  Location: [place];  Service: Orthopedics;  Laterality: Right;  . WISDOM TOOTH EXTRACTION      There were no vitals filed for this visit.      Subjective Assessment - 03/30/17 0807    Subjective Back is not great, its been hurting since yesterday. 3/10 currently. Scapular muscles also feel sure. Back bones feel like they're rattling.  Has also been busy. Has not had a day off for 10 days.  Was fine after last session.    Pertinent History Chronic back and R shoulder pain. Pt states having R shoulder surgery on September 02, 2016 secondary to R shoulder fracture. Has not had PT for his R shoulder. Feels a constant discomfort at his arm (feels like someone is compressing his shoulder such as there is a screw holding it together). Feels it more at the end of the day.  But his R shoulder feels better since his surgery.  Pt also states difficulty raising his R shoulder due to sharp pain in the front and side.  Currently has difficulty reaching with his R arm.   Pt also states having pain in his middle back with constant pain. Had x-rays which revealed bone spurs in his vertebrae. Thinks there area compressed discs going on but has an appointment with his MD to discuss that.   Feels like his back pain has gotten worse since the past year. Pt states that his doctor did not see any fractures.  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   Pt states that his R arm does not feel like its healed but his MD says that it is and is very happy with the X-rays.  Does some exercises with the green or purple elastic band (ER, IR, flexion, extension since the 12th week mark post op). R shoulder is better compared to a month ago.  Wants to work on his shoulder today.   R hand dominant, L hand writing and eating, and cutting with a knife   Patient Stated Goals Want sort of normal range of motion for his R shoulder.    Currently in Pain? Yes   Pain Score 3    Pain Onset More than a month ago  PT Education - 03/30/17 0810    Education provided Yes   Education Details ther-ex, HEP   Person(s) Educated Patient   Methods Explanation;Demonstration;Tactile cues;Verbal cues;Handout   Comprehension Returned demonstration;Verbalized understanding        Objectives    There-ex  Seated trunk flexion 10x5 seconds   Then to the R 10x5 seconds  Then to the L 10x5 seconds  Seated trunk rotation 10x each direction  Seated pallof press straight resisting yellow band 10x5 seconds  Then 10x10 seconds Seated bilateral shoulder extension isometrics, hands against thighs 10x5 seconds for 3 sets  [employer] in with trunk movement   No change in back symptoms after aforementioned exercses  Seated chin tucks 10x3 with  5 second holds  Standing L trunk side bending 7x5 seconds   R trunk side bending 7x5 seconds  Posture: slight R lateral shift posture around the thoracolumbar junction.   Standing L shoulder adduction resisting double yellow band 10x3 with 5 second holds    Possible decrease in symptoms. Pt seems uncertain.    Reviewed and given aforementioned exercise at part of his HEP. Pt demonstrated and verbalized understanding.    Seated manually resisted L trunk side bend isometrics in neutral 10x5 seconds   Improved exercise technique, movement at target joints, use of target muscles after min to mod verbal, visual, tactile cues.    No change in symptoms when performing exercises to promote trunk muscle use or with trunk mobility. Pt demonstrates possible decrease in back symptoms, though pt seems uncertain after performing standing L shoulder adduction resisting double yellow band to correct slight R lateral shift posture. Fair tolerance to therapy for his back today. No complain of shoulder pain.          PT Long Term Goals - 03/06/17 1207      PT LONG TERM GOAL #1   Title Patient will improve R shoulder flexion and abduction AROM to at least 135 degrees to promote ability to reach with less pain.    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    Baseline 8/10 back pain at worst for the past month (01/04/2017); 5/10 mid back pain at most for the past 7 days (02/13/2017); 7/10 at worst (03/06/2017)   Time 6   Period Weeks   Status On-going   Target Date 04/06/17      PT LONG TERM GOAL #3   Title Patient will improve his Quick Dash Disability/Symptom score by at least 15% as a demonstration of improved function.    Baseline 47.72% (01/04/2017); 31.8% (02/13/2017)   Time 6   Period Weeks   Status Achieved     PT LONG TERM GOAL #4   Title Patient will improve his Modified [place] Low Back Pain Disability Questionnaire score by at least 12% as a demonstration of improved function.    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    Baseline 5/10 mid back pain at most for the past 7 days (02/13/2017); 7/10 at worst (03/06/2017)   Time 4   Period Weeks   Status New   Target Date 04/06/17               Plan - 03/30/17 1771    Clinical Impression Statement No change in symptoms when performing exercises to promote trunk muscle use or with trunk mobility. Pt demonstrates possible decrease in back symptoms, though pt seems uncertain after performing standing L shoulder adduction resisting double yellow band to correct slight R lateral shift posture. Fair tolerance to therapy for his back today. No complain of shoulder pain.    History and Personal Factors relevant to plan of care: R humeral shaft fracture, S/P ORIF, back pain, finances   Clinical Presentation due to: Back pain sometimes improves, but increases with his busy work schedule   Clinical Decision Making Low   Rehab Potential Fair   Clinical Impairments Affecting Rehab Potential Chronicity of condition, finances/insurance   PT Frequency 2x / week   PT Duration 4 weeks   PT Treatment/Interventions Therapeutic activities;Therapeutic exercise;Patient/family education;Manual techniques;Dry needling;Neuromuscular re-education;Traction;Iontophoresis 25m/ml Dexamethasone;Electrical Stimulation;Aquatic  Therapy  modalities if appropriate; traction for back if appropriate   PT Next Visit Plan scapular strengthening, R shoulder AROM/gentle strengthening as appropriate, manual techniques, hip and trunk strengthening, posture   Consulted and Agree with Plan of Care Patient      Patient will benefit from skilled therapeutic intervention in order to improve the following deficits and impairments:  Decreased strength, Pain, Postural dysfunction, Improper body mechanics, Decreased range of motion  Visit Diagnosis: Chronic low back pain, unspecified back pain laterality, with sciatica presence unspecified  Pain in thoracic spine     Problem List Patient Active Problem List   Diagnosis Date Noted  . Fracture of humerus, proximal, right, closed 09/03/2016  . Fracture of humeral shaft, right, closed 09/02/2016  . Diabetes ([place] 09/02/2016  . Essential hypertension, benign 09/02/2016    MJoneen BoersPT, DPT   03/30/2017, 9:02 AM  [facility] [address] [place] [address]Phone: [phone]  Fax:  [phone] Name: RClaron RosencransMRN: [id number] of Birth: 903-Feb-1967

## 2017-03-30 NOTE — Patient Instructions (Signed)
   Hold tubing in left hand, arm out. Pull arm to your side. Do not twist or rotate trunk.  Hold for 5 seconds. Repeat ___10_ times per set. Do 3____ sets per session. Do _1___ sessions per day.  http://orth.exer.us/834   Copyright  VHI. All rights reserved.

## 2017-04-03 ENCOUNTER — Ambulatory Visit: Payer: No Typology Code available for payment source

## 2017-04-03 DIAGNOSIS — G8929 Other chronic pain: Secondary | ICD-10-CM

## 2017-04-03 DIAGNOSIS — M546 Pain in thoracic spine: Secondary | ICD-10-CM

## 2017-04-03 DIAGNOSIS — M545 Low back pain: Principal | ICD-10-CM

## 2017-04-03 NOTE — Therapy (Signed)
[facility] [address] [place], [address] Phone: [phone]   Fax:  [phone]  Physical Therapy Treatment  Patient Details  Name: [name] MRN: [id number] Date of Birth: April 04, 1966 Referring Provider: Fredia Beets, FNP  Encounter Date: 04/03/2017      PT End of Session - 04/03/17 0804    Visit Number 20   Number of Visits 27   Date for PT Re-Evaluation 04/06/17   PT Start Time 0804   PT Stop Time 0852   PT Time Calculation (min) 48 min   Activity Tolerance Patient tolerated treatment well   Behavior During Therapy [facility] for tasks assessed/performed      Past Medical History:  Diagnosis Date  . Diabetes mellitus without complication ([place])   . Fracture closed, humerus    right  . Fracture of humeral shaft, right, closed 09/02/2016  . Fracture of humerus, proximal, right, closed 09/03/2016  . Hypertension     Past Surgical History:  Procedure Laterality Date  . CHOLECYSTECTOMY    . ORIF HUMERUS FRACTURE Right 09/02/2016   Procedure: OPEN REDUCTION INTERNAL FIXATION (ORIF) RIGHT  HUMERAL SHAFT FRACTURE;  Surgeon: Altamese Dumfries, MD;  Location: [facility];  Service: Orthopedics;  Laterality: Right;  . WISDOM TOOTH EXTRACTION      There were no vitals filed for this visit.      Subjective Assessment - 04/03/17 0804    Subjective Back is not great, 3/10 currently. R shoulder is doing ok. Bending over and trying to get back up from that position bothers his back.     Pertinent History Chronic back and R shoulder pain. Pt states having R shoulder surgery on September 02, 2016 secondary to R shoulder fracture. Has not had PT for his R shoulder. Feels a constant discomfort at his arm (feels like someone is compressing his shoulder such as there is a screw holding it together). Feels it more at the end of the day. But his R shoulder feels better since his surgery.  Pt also states difficulty raising his R  shoulder due to sharp pain in the front and side.  Currently has difficulty reaching with his R arm.   Pt also states having pain in his middle back with constant pain. Had x-rays which revealed bone spurs in his vertebrae. Thinks there area compressed discs going on but has an appointment with his MD to discuss that.   Feels like his back pain has gotten worse since the past year. Pt states that his doctor did not see any fractures.  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   Pt states that his R arm does not feel like its healed but his MD says that it is and is very happy with the X-rays.  Does some exercises with the green or purple elastic band (ER, IR, flexion, extension since the 12th week mark post op). R shoulder is better compared to a month ago.  Wants to work on his shoulder today.   R hand dominant, L hand writing and eating, and cutting with a knife   Patient Stated Goals Want sort of normal range of motion for his R shoulder.    Currently in Pain? Yes   Pain Score 3   back pain   Pain Onset More than a month ago  PT Education - 04/03/17 0805    Education provided Yes   Education Details ther-ex   Person(s) Educated Patient   Methods Explanation;Demonstration;Tactile cues;Verbal cues   Comprehension Returned demonstration;Verbalized understanding        Objectives    There-ex   Seated L trunk side bend isometrics 10x5 seconds for 3 sets  Standing L shoulder adduction resisting double yellow band 10x2 with 10 second holds to correct R lateral shift  standing low rows resisting red band 10x10 seconds for 2 sets to promote trunk muscle activation  Standing chin tucks 10x5 seconds to promote upper thoracic extension for 3 sets   Seated thoracic extension over chair 10x5 seconds with small towel behind upper back to promote gentle thoracic extension, feet propped up on 3 inch step for low back  comfort  standing pallof press 10x5 seconds each side resisting red band  standing R lateral shift correction with use of 2 gait belts to make a long one, loop around R flank (so no pressure to R arm) 10x2 with 5 second holds  Try R lateral shift correction with theraband instead of gait belt in sitting next visit if appropriate.   Improved exercise technique, movement at target joints, use of target muscles after min to mod verbal, visual, tactile cues.     Pt tolerated session well without aggravation of symptoms. Possible slight decrease in back pain following exercises to decrease R lateral shift but pt states feeling pain/discomfort at the same time. No change in symptoms with thoracic extension and trunk muscle activation exercises.           PT Long Term Goals - 03/06/17 1207      PT LONG TERM GOAL #1   Title Patient will improve R shoulder flexion and abduction AROM to at least 135 degrees to promote ability to reach with less pain.    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    Baseline 8/10 back pain at worst for the past month (01/04/2017); 5/10 mid back pain at most for the past 7 days (02/13/2017); 7/10 at worst (03/06/2017)   Time 6   Period Weeks   Status On-going   Target Date 04/06/17     PT LONG TERM GOAL #3   Title Patient will improve his Quick Dash Disability/Symptom score by at least 15% as a demonstration of improved function.    Baseline 47.72% (01/04/2017); 31.8% (02/13/2017)   Time 6   Period Weeks   Status Achieved     PT  LONG TERM GOAL #4   Title Patient will improve his Modified [place] Low Back Pain Disability Questionnaire score by at least 12% as a demonstration of improved function.    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    Baseline 5/10 mid back pain at most for the past 7 days (02/13/2017); 7/10 at worst (03/06/2017)   Time 4   Period Weeks   Status New   Target Date 04/06/17               Plan - 04/03/17 0809    Clinical Impression Statement Pt tolerated session well without aggravation of symptoms. Possible slight decrease in back pain following exercises to decrease R lateral shift but pt states feeling pain/discomfort at the same time. No change in symptoms with thoracic extension and trunk muscle activation exercises.     History and Personal Factors relevant to plan of care: R humeral shaft fracture, S/P ORIF, back pain, finances   Clinical Presentation Stable   Clinical Presentation due to: pt tolerated session without aggravation of symptoms.    Clinical Decision Making Low   Rehab Potential Fair   Clinical Impairments Affecting Rehab Potential Chronicity of condition, finances/insurance   PT Frequency 2x / week   PT Duration 4 weeks   PT Treatment/Interventions Therapeutic activities;Therapeutic exercise;Patient/family education;Manual techniques;Dry needling;Neuromuscular re-education;Traction;Iontophoresis 39m/ml Dexamethasone;Electrical Stimulation;Aquatic Therapy  modalities if appropriate; traction for back if appropriate   PT Next Visit Plan scapular strengthening, R shoulder AROM/gentle strengthening as appropriate, manual techniques, hip and trunk strengthening, posture   Consulted and Agree with Plan of Care Patient      Patient will benefit from skilled therapeutic  intervention in order to improve the following deficits and impairments:  Decreased strength, Pain, Postural dysfunction, Improper body mechanics, Decreased range of motion  Visit Diagnosis: Chronic low back pain, unspecified back pain laterality, with sciatica presence unspecified  Pain in thoracic spine     Problem List Patient Active Problem List   Diagnosis Date Noted  . Fracture of humerus, proximal, right, closed 09/03/2016  . Fracture of humeral shaft, right, closed 09/02/2016  . Diabetes ([facility] 09/02/2016  . Essential hypertension, benign 09/02/2016    MJoneen BoersPT, DPT   04/03/2017, 9:08 AM  [facility] [address] [place] [address]Phone: [phone]  Fax:  [phone] Name: Shaun OpielaMRN: [id number] of Birth: 9October 13, 1967

## 2017-04-05 ENCOUNTER — Ambulatory Visit: Payer: No Typology Code available for payment source

## 2017-04-05 DIAGNOSIS — G8929 Other chronic pain: Secondary | ICD-10-CM

## 2017-04-05 DIAGNOSIS — M545 Low back pain: Principal | ICD-10-CM

## 2017-04-05 DIAGNOSIS — M546 Pain in thoracic spine: Secondary | ICD-10-CM

## 2017-04-05 DIAGNOSIS — M25511 Pain in right shoulder: Secondary | ICD-10-CM

## 2017-04-05 NOTE — Therapy (Signed)
[facility] [address], [place], [address] Phone: [phone]   Fax:  [phone]  Physical Therapy Treatment  Patient Details  Name: [name] MRN: [id number] Date of Birth: 1965-08-11 Referring Provider: Fredia Beets, FNP  Encounter Date: 04/05/2017      PT End of Session - 04/05/17 0803    Visit Number 21   Number of Visits 35   Date for PT Re-Evaluation 05/04/17   PT Start Time 0804   PT Stop Time 0856   PT Time Calculation (min) 52 min   Activity Tolerance Patient tolerated treatment well   Behavior During Therapy [facility] for tasks assessed/performed      Past Medical History:  Diagnosis Date  . Diabetes mellitus without complication ([place])   . Fracture closed, humerus    right  . Fracture of humeral shaft, right, closed 09/02/2016  . Fracture of humerus, proximal, right, closed 09/03/2016  . Hypertension     Past Surgical History:  Procedure Laterality Date  . CHOLECYSTECTOMY    . ORIF HUMERUS FRACTURE Right 09/02/2016   Procedure: OPEN REDUCTION INTERNAL FIXATION (ORIF) RIGHT  HUMERAL SHAFT FRACTURE;  Surgeon: Altamese , MD;  Location: [place];  Service: Orthopedics;  Laterality: Right;  . WISDOM TOOTH EXTRACTION      There were no vitals filed for this visit.      Subjective Assessment - 04/05/17 0805    Subjective Back is not great at all. Really been a tough couple of days. Went to bed last night and had difficulty getting up in the morning. Has been working a lot for the past 2 days and it has been a stressor.  Feels like a "rattling bone crunching pain."  Believes his next MD appointment is in the beginning of September. The injection for his back did not really help.  3-4/10 back pain currently. 7/10 last night and at worst.  Pt was on his L and R sides. Trying to get up to go to the bathroom is really bad.  Feels like his back "warms up" during the day and it feels a little  better.    Pertinent History Chronic back and R shoulder pain. Pt states having R shoulder surgery on September 02, 2016 secondary to R shoulder fracture. Has not had PT for his R shoulder. Feels a constant discomfort at his arm (feels like someone is compressing his shoulder such as there is a screw holding it together). Feels it more at the end of the day. But his R shoulder feels better since his surgery.  Pt also states difficulty raising his R shoulder due to sharp pain in the front and side.  Currently has difficulty reaching with his R arm.   Pt also states having pain in his middle back with constant pain. Had x-rays which revealed bone spurs in his vertebrae. Thinks there area compressed discs going on but has an appointment with his MD to discuss that.   Feels like his back pain has gotten worse since the past year. Pt states that his doctor did not see any fractures.  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   Pt states that his R arm does not feel like its healed but his MD says that it is and is very happy with the X-rays.  Does some exercises with the green or purple elastic band (ER, IR, flexion, extension since the 12th week mark post op). R  shoulder is better compared to a month ago.  Wants to work on his shoulder today.   R hand dominant, L hand writing and eating, and cutting with a knife   Patient Stated Goals Want sort of normal range of motion for his R shoulder.    Currently in Pain? Yes   Pain Score 4    Pain Location Back   Pain Onset More than a month ago   Aggravating Factors  upright (feels like everything is being "scrunched down", turning in bed, leaning forward, picking things up   Pain Relieving Factors lying down on his back or being in the reclined position                                 PT Education - 04/05/17 0821    Education provided Yes   Education Details ther-ex   Person(s) Educated Patient   Methods  Explanation;Demonstration;Tactile cues;Verbal cues   Comprehension Returned demonstration;Verbalized understanding        Objectives    Pt states that the hospital approved 100% coverage for his visits until January 2019. Sitting and leaning forward and returning to the starting position, R side bending bothers back especially midway during range of motion. Sitting after standing feels good Pt states that he wants to continue to try to work on his back.      There-ex  Sitting on table, unsupported: pt states back feels weak and unstable  Seated R hip extension isometrics 10x2 with 5 second holds  Seated L hip extension isometrics 10x2 with 5 second holds  Reviewed plan of care: 2x/week for another 4 weeks to help with back pain  Standing L trunk side bend 10x5 seconds   Standing posture today: slight L trunk side bend  Standing R trunk side bend isometrics in neutral 10x2 with 5 second holds  standing R lateral shift correction with use of 2 gait belts to make a long one, loop around R flank (so no pressure to R arm) 10x2 with 5 second holds  Sitting with a towel roll behind back (thoracolumbar junction). Pt states feeling more supported and feels like something is opening up. Feels better for pt.   Then with seated bilateral shoulder extension isometrics 10x5 seconds to promote core muscle activation.   Then with seated hip adduction ball squeeze with glute max squeeze 10x10 seconds. Did not notice back discomfort due to challenge to muscles.   Standing R shoulder adduction resisting yellow band 10x Standing L shoulder adduction resisting yellow band 10x   No change in back pain with aforementioned band exercises   Improved exercise technique, movement at target joints, use of target muscles after min to mod verbal, visual, tactile cues.    Currently working on improving back pain secondary to pt making good progress with his R shoulder ROM and function from previous  sessions. Currently demonstrates increased back pain in which increased workload at his job may play a factor based on pt subjective reports. Possible decrease in back pain with addition of lumbar towel roll support at the thoracolumbar junction area with back rest in sitting.  Possible overall improved back symptoms with promoting thoracic mobility as well. Spherical mass, not firm, palpated around his right L 1 area during his 03/27/2017 appointment. Pt was recommended to ask his MD about it and pt verbalized understanding. Pt will benefit from continued skilled physical therapy services to help decrease back  pain, improve strength and ability to perform functional tasks.            PT Long Term Goals - 04/05/17 4580      PT LONG TERM GOAL #1   Title Patient will improve R shoulder flexion and abduction AROM to at least 135 degrees to promote ability to reach with less pain.    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    Baseline 8/10 back pain at worst for the past month (01/04/2017); 5/10 mid back pain at most for the past 7 days (02/13/2017); 7/10 at worst (03/06/2017), (04/05/2017)    Time 4   Period Weeks   Status On-going   Target Date 05/04/17     PT LONG TERM GOAL #3   Title Patient will improve his Quick Dash Disability/Symptom score by at least 15% as a demonstration of improved function.    Baseline 47.72% (01/04/2017); 31.8% (02/13/2017)   Time 6   Period Weeks   Status Achieved     PT LONG TERM GOAL #4   Title Patient will improve his Modified  [place] Low Back Pain Disability Questionnaire score by at least 12% as a demonstration of improved function.    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    Baseline 5/10 mid back pain at most for the past 7 days (02/13/2017); 7/10 at worst (03/06/2017), (04/05/2017)   Time 4   Period Weeks   Status On-going   Target Date 05/04/17               Plan - 04/05/17 9983    Clinical Impression Statement Currently working on improving back pain secondary to pt making good progress with his R shoulder ROM and function from previous sessions. Currently demonstrates increased back pain in which increased workload at his job may play a factor based on pt subjective reports. Possible decrease in back pain with addition of lumbar towel roll support at the thoracolumbar junction area with back rest in sitting.  Possible overall improved back symptoms with promoting thoracic mobility as well. Spherical mass, not firm, palpated around his right L 1 area during his 03/27/2017 appointment. Pt was recommended to ask his MD about it and pt verbalized understanding. Pt will benefit from continued skilled physical therapy services to help decrease back pain, improve strength and ability to perform functional tasks.    History and Personal Factors relevant to plan of care: R humeral shaft fracture, S/P ORIF, back pain, finances, back pain, chronicity of condition   Clinical Presentation Stable   Clinical Decision Making Low   Rehab Potential Fair   Clinical Impairments Affecting Rehab Potential Chronicity of condition, finances/insurance   PT Frequency 2x / week  PT Duration 4 weeks   PT Treatment/Interventions Therapeutic activities;Therapeutic exercise;Patient/family education;Manual techniques;Dry  needling;Neuromuscular re-education;Traction;Iontophoresis 2m/ml Dexamethasone;Electrical Stimulation;Aquatic Therapy  modalities if appropriate; traction for back if appropriate   PT Next Visit Plan scapular strengthening, R shoulder AROM/gentle strengthening as appropriate, manual techniques, hip and trunk strengthening, posture   Consulted and Agree with Plan of Care Patient      Patient will benefit from skilled therapeutic intervention in order to improve the following deficits and impairments:  Decreased strength, Pain, Postural dysfunction, Improper body mechanics, Decreased range of motion  Visit Diagnosis: Chronic low back pain, unspecified back pain laterality, with sciatica presence unspecified - Plan: PT plan of care cert/re-cert  Pain in thoracic spine - Plan: PT plan of care cert/re-cert  Chronic right shoulder pain - Plan: PT plan of care cert/re-cert     Problem List Patient Active Problem List   Diagnosis Date Noted  . Fracture of humerus, proximal, right, closed 09/03/2016  . Fracture of humeral shaft, right, closed 09/02/2016  . [place] 09/02[place]  . Essential hypertension, benign 09/02/2016    MJoneen BoersPT, DPT   04/05/2017, 10:26 AM  [facility] [address] [place] [address]Phone: [phone]  Fax:  [phone] Name: Shaun GodwinMRN: [id number] of Birth: 9Apr 30, 1967

## 2017-04-05 NOTE — Patient Instructions (Signed)
Sitting with a towel roll behind your back at a comfortable position,    Gently press your hands on your thighs to feel your abdominal muscles contract.    Hold for 5 seconds   Repeat 10 times    Perform 3 sets for 2 times daily

## 2017-04-11 ENCOUNTER — Ambulatory Visit: Payer: No Typology Code available for payment source

## 2017-04-13 ENCOUNTER — Ambulatory Visit: Payer: No Typology Code available for payment source | Attending: Family Medicine

## 2017-04-13 DIAGNOSIS — M545 Low back pain: Secondary | ICD-10-CM | POA: Insufficient documentation

## 2017-04-13 DIAGNOSIS — G8929 Other chronic pain: Secondary | ICD-10-CM | POA: Insufficient documentation

## 2017-04-13 DIAGNOSIS — M546 Pain in thoracic spine: Secondary | ICD-10-CM

## 2017-04-13 DIAGNOSIS — M25511 Pain in right shoulder: Secondary | ICD-10-CM | POA: Insufficient documentation

## 2017-04-13 NOTE — Patient Instructions (Signed)
  Gave side stepping to work glute med muscles as part of his HEP on flat sturdy surface. Pt demonstrated and verbalized understanding.

## 2017-04-13 NOTE — Therapy (Signed)
[facility] [address] [place], [address] Phone: [phone]   Fax:  [phone]  Physical Therapy Treatment  Patient Details  Name: [name] MRN: [id number] Date of Birth: [birth year] Referring Provider: Fredia Beets, FNP  Encounter Date: 04/13/2017      PT End of Session - 04/13/17 0805    Visit Number 22   Number of Visits 35   Date for PT Re-Evaluation 05/04/17   PT Start Time 0805   PT Stop Time 0850   PT Time Calculation (min) 45 min   Activity Tolerance Patient tolerated treatment well   Behavior During Therapy [facility] for tasks assessed/performed      Past Medical History:  Diagnosis Date  . Diabetes mellitus without complication ([place])   . Fracture closed, humerus    right  . Fracture of humeral shaft, right, closed 09/02/2016  . Fracture of humerus, proximal, right, closed 09/03/2016  . Hypertension     Past Surgical History:  Procedure Laterality Date  . CHOLECYSTECTOMY    . ORIF HUMERUS FRACTURE Right 09/02/2016   Procedure: OPEN REDUCTION INTERNAL FIXATION (ORIF) RIGHT  HUMERAL SHAFT FRACTURE;  Surgeon: Altamese [place], MD;  Location: [place];  Service: Orthopedics;  Laterality: Right;  . WISDOM TOOTH EXTRACTION      There were no vitals filed for this visit.      Subjective Assessment - 04/13/17 0806    Subjective Back is so so. Has not been great. R shoulder is not bad.   Pertinent History Chronic back and R shoulder pain. Pt states having R shoulder surgery on September 02, 2016 secondary to R shoulder fracture. Has not had PT for his R shoulder. Feels a constant discomfort at his arm (feels like someone is compressing his shoulder such as there is a screw holding it together). Feels it more at the end of the day. But his R shoulder feels better since his surgery.  Pt also states difficulty raising his R shoulder due to sharp pain in the front and side.  Currently has difficulty  reaching with his R arm.   Pt also states having pain in his middle back with constant pain. Had x-rays which revealed bone spurs in his vertebrae. Thinks there area compressed discs going on but has an appointment with his MD to discuss that.   Feels like his back pain has gotten worse since the past year. Pt states that his doctor did not see any fractures.  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   Pt states that his R arm does not feel like its healed but his MD says that it is and is very happy with the X-rays.  Does some exercises with the green or purple elastic band (ER, IR, flexion, extension since the 12th week mark post op). R shoulder is better compared to a month ago.  Wants to work on his shoulder today.   R hand dominant, L hand writing and eating, and cutting with a knife   Patient Stated Goals Want sort of normal range of motion for his R shoulder.    Currently in Pain? Yes   Pain Score --  no pain level provided   Pain Onset More than a month ago                                 PT Education -  04/13/17 0810    Education provided Yes   Education Details ther-ex, HEP   Person(s) Educated Patient   Methods Explanation;Demonstration;Tactile cues;Verbal cues   Comprehension Returned demonstration;Verbalized understanding         Objectives    There-ex   Supine bilateral shoulder flexion in hooklying with 2 lb weight at R hand 10x2 with 5 second holds to promote shoulder flexion ROM and thoracic extension (to decrease low back pressure)  Supine open books in hooklying position 10x5 second holds to promote thoracic extension 10x2 with 5 second holds  Sitting on dyna disc  Bilateral shoulder low rows resisting red band 10x with 5 second holds, then 10x10 seconds to promote trunk muscle use  Reclined bilateral shoulder extension resisting red band 10x3 with 5 second holds to promote trunk muscle use  Side stepping 32 ft to the  R and 32 ft to the L for 2 sets to promote glute med muscle use  Forward wedding march 64 ft x 2 to promote glute max muscle use  Standing leg press resisting double blue band 12x each LE with bilateral UE assist  Improved exercise technique, movement at target joints, use of target muscles after min to mod verbal, visual, tactile cues.    Continued working on gentle thoracic extension, trunk muscle activation and glute muscle strengthening and use to help with back pain with addition of R shoulder flexion ROM to promote ability to perform functional tasks. Pt states feeling muscle fatigue at his abdomen and hips after session but his back felt a little better afterwards.           PT Long Term Goals - 04/05/17 1245      PT LONG TERM GOAL #1   Title Patient will improve R shoulder flexion and abduction AROM to at least 135 degrees to promote ability to reach with less pain.    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    Baseline 8/10 back pain at worst for the past month (01/04/2017); 5/10 mid back pain at most for the past 7 days (02/13/2017); 7/10 at worst (03/06/2017), (04/05/2017)    Time 4   Period Weeks   Status On-going   Target Date 05/04/17     PT LONG TERM GOAL #3   Title Patient will improve his Quick Dash Disability/Symptom score by at least 15% as a demonstration of improved function.    Baseline 47.72% (01/04/2017); 31.8% (02/13/2017)   Time 6   Period Weeks   Status Achieved     PT LONG TERM GOAL #4   Title Patient will improve his  Modified [place] Low Back Pain Disability Questionnaire score by at least 12% as a demonstration of improved function.    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    Baseline 5/10 mid back pain at most for the past 7 days (02/13/2017); 7/10 at worst (03/06/2017), (04/05/2017)   Time 4   Period Weeks   Status On-going   Target Date 05/04/17               Plan - 04/13/17 0804    Clinical Impression Statement Continued working on gentle thoracic extension, trunk muscle activation and glute muscle strengthening and use to help with back pain with addition of R shoulder flexion ROM to promote ability to perform functional tasks. Pt states feeling muscle fatigue at his abdomen and hips after session but his back felt a little better afterwards.    History and Personal Factors relevant to plan of care: R humeral shaft fracture, S/P ORIF, back pain, finances, back pain, chronicity of condition   Clinical Presentation Stable   Clinical Presentation due to: pt tolerated session without aggravation of symptoms   Clinical Decision Making Low   Rehab Potential Fair   Clinical Impairments Affecting Rehab Potential Chronicity of condition, finances/insurance   PT Frequency 2x / week   PT Duration 4 weeks   PT Treatment/Interventions Therapeutic activities;Therapeutic exercise;Patient/family education;Manual techniques;Dry needling;Neuromuscular re-education;Traction;Iontophoresis 48m/ml Dexamethasone;Electrical Stimulation;Aquatic Therapy  modalities if appropriate; traction for back if appropriate   PT Next Visit Plan scapular strengthening, R shoulder AROM/gentle strengthening as appropriate, manual techniques, hip and trunk strengthening, posture   Consulted and Agree with Plan of Care Patient       Patient will benefit from skilled therapeutic intervention in order to improve the following deficits and impairments:  Decreased strength, Pain, Postural dysfunction, Improper body mechanics, Decreased range of motion  Visit Diagnosis: Chronic low back pain, unspecified back pain laterality, with sciatica presence unspecified  Pain in thoracic spine  Chronic right shoulder pain     Problem List Patient Active Problem List   Diagnosis Date Noted  . Fracture of humerus, proximal, right, closed 09/03/2016  . Fracture of humeral shaft, right, closed 09/02/2016  . [place] 09/02[place]2016  . Essential hypertension, benign 09/02/2016     MJoneen BoersPT, DPT   04/13/2017, 8:55 AM  [facility] [address] [place] [address]Phone: [phone]  Fax:  [phone] Name: RNorvin OhlinMRN: [id number] of Birth: 904/07/1966

## 2017-04-17 ENCOUNTER — Ambulatory Visit: Payer: No Typology Code available for payment source

## 2017-04-17 DIAGNOSIS — M545 Low back pain: Principal | ICD-10-CM

## 2017-04-17 DIAGNOSIS — M546 Pain in thoracic spine: Secondary | ICD-10-CM

## 2017-04-17 DIAGNOSIS — G8929 Other chronic pain: Secondary | ICD-10-CM

## 2017-04-17 NOTE — Therapy (Signed)
[facility] [address], [place], [address] Phone: [phone]   Fax:  [phone]  Physical Therapy Treatment  Patient Details  Name: [name] MRN: [id number] Date of Birth: September 19, 1965 Referring Provider: Fredia Beets, FNP  Encounter Date: 04/17/2017      PT End of Session - 04/17/17 0808    Visit Number 23   Number of Visits 35   Date for PT Re-Evaluation 05/04/17   PT Start Time 0808   PT Stop Time 0853   PT Time Calculation (min) 45 min   Activity Tolerance Patient tolerated treatment well   Behavior During Therapy [facility] for tasks assessed/performed      Past Medical History:  Diagnosis Date  . Diabetes mellitus without complication ([place])   . Fracture closed, humerus    right  . Fracture of humeral shaft, right, closed 09/02/2016  . Fracture of humerus, proximal, right, closed 09/03/2016  . Hypertension     Past Surgical History:  Procedure Laterality Date  . CHOLECYSTECTOMY    . ORIF HUMERUS FRACTURE Right 09/02/2016   Procedure: OPEN REDUCTION INTERNAL FIXATION (ORIF) RIGHT  HUMERAL SHAFT FRACTURE;  Surgeon: Altamese , MD;  Location: [facility];  Service: Orthopedics;  Laterality: Right;  . WISDOM TOOTH EXTRACTION      There were no vitals filed for this visit.      Subjective Assessment - 04/17/17 0809    Subjective Back feels pretty relaxed right now. Has not done much. Did some side stepping exercise and hips were sore.    Pertinent History Chronic back and R shoulder pain. Pt states having R shoulder surgery on September 02, 2016 secondary to R shoulder fracture. Has not had PT for his R shoulder. Feels a constant discomfort at his arm (feels like someone is compressing his shoulder such as there is a screw holding it together). Feels it more at the end of the day. But his R shoulder feels better since his surgery.  Pt also states difficulty raising his R shoulder due to sharp pain  in the front and side.  Currently has difficulty reaching with his R arm.   Pt also states having pain in his middle back with constant pain. Had x-rays which revealed bone spurs in his vertebrae. Thinks there area compressed discs going on but has an appointment with his MD to discuss that.   Feels like his back pain has gotten worse since the past year. Pt states that his doctor did not see any fractures.  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   Pt states that his R arm does not feel like its healed but his MD says that it is and is very happy with the X-rays.  Does some exercises with the green or purple elastic band (ER, IR, flexion, extension since the 12th week mark post op). R shoulder is better compared to a month ago.  Wants to work on his shoulder today.   R hand dominant, L hand writing and eating, and cutting with a knife   Patient Stated Goals Want sort of normal range of motion for his R shoulder.    Currently in Pain? Other (Comment)   Pain Score --  no pain level provided   Pain Onset More than a month ago  PT Education - 04/17/17 [phone]    Education provided Yes   Education Details ther-ex, HEP   Person(s) Educated Patient   Methods Explanation;Demonstration;Tactile cues;Verbal cues   Comprehension Returned demonstration;Verbalized understanding        Objectives   Standing posture: R lateral shift  There-ex   Supine bilateral shoulder flexion in hooklying with 2 lb weight at R hand 10x2 with 5 second holds to promote shoulder flexion ROM and thoracic extension (to decrease low back pressure)   Standing leg press resisting double blue band 10x3 each LE with bilateral UE assist   Sitting on dyna disc             Bilateral shoulder low rows resisting red band10x10 seconds to promote trunk muscle use,  Then with green band 10x10 second holds  Forward wedding march 64 ft x 2 to promote  glute max muscle use  Standing R lateral shift correction 10x2 with 5 second holds  Supine open books in hooklying position 10x5 second holds to promote thoracic extension 10x2 with 5 second holds for 2 sets  Reclined bilateral shoulder extension resisting red band 10x3 with 5 second holds to promote trunk muscle use   Try L trunk side bending next visit if appropriate    Improved exercise technique, movement at target joints, use of target muscles after min to mod verbal, visual, tactile cues.    Added standing R lateral shift correction as part of his HEP to improve posture. Pt tolerated session well without aggravation of symptoms. Continued working on thoracic extension, trunk and hip strengthening in addition to posture to help decrease back pain.         PT Long Term Goals - 04/05/17 2524      PT LONG TERM GOAL #1   Title Patient will improve R shoulder flexion and abduction AROM to at least 135 degrees to promote ability to reach with less pain.    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    Baseline 8/10 back pain at worst for the past month (01/04/2017); 5/10 mid back pain at most for the past 7 days (02/13/2017); 7/10 at worst (03/06/2017), (04/05/2017)    Time 4   Period Weeks   Status On-going   Target Date 05/04/17     PT LONG TERM GOAL #3   Title Patient will improve his Quick Dash Disability/Symptom score by at least 15% as a demonstration of improved function.    Baseline 47.72% (01/04/2017); 31.8% (02/13/2017)   Time 6   Period  Weeks   Status Achieved     PT LONG TERM GOAL #4   Title Patient will improve his Modified [place] Low Back Pain Disability Questionnaire score by at least 12% as a demonstration of improved function.    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    Baseline 5/10 mid back pain at most for the past 7 days (02/13/2017); 7/10 at worst (03/06/2017), (04/05/2017)   Time 4   Period Weeks   Status On-going   Target Date 05/04/17               Plan - 04/17/17 9983    Clinical Impression Statement Added standing R lateral shift correction as part of his HEP to improve posture. Pt tolerated session well without aggravation of symptoms. Continued working on thoracic extension, trunk and hip strengthening in addition to posture to help decrease back pain.    History and Personal Factors relevant to plan of care: R humeral shaft fracture, S/P ORIF, back pain, finances, back pain, chronicity of condition   Clinical Presentation Stable   Clinical Presentation due to: Pt tolerated session without aggravation of symptoms   Clinical Decision Making Low   Rehab Potential Fair   Clinical Impairments Affecting Rehab Potential Chronicity of condition, finances/insurance   PT Frequency 2x / week   PT Duration 4 weeks   PT Treatment/Interventions Therapeutic activities;Therapeutic exercise;Patient/family education;Manual techniques;Dry needling;Neuromuscular re-education;Traction;Iontophoresis 59m/ml Dexamethasone;Electrical Stimulation;Aquatic Therapy  modalities if appropriate; traction for back if appropriate   PT Next Visit Plan scapular strengthening, R shoulder AROM/gentle strengthening as appropriate, manual techniques, hip and trunk strengthening, posture   Consulted and Agree with Plan of Care  Patient      Patient will benefit from skilled therapeutic intervention in order to improve the following deficits and impairments:  Decreased strength, Pain, Postural dysfunction, Improper body mechanics, Decreased range of motion  Visit Diagnosis: Chronic low back pain, unspecified back pain laterality, with sciatica presence unspecified  Pain in thoracic spine     Problem List Patient Active Problem List   Diagnosis Date Noted  . Fracture of humerus, proximal, right, closed 09/03/2016  . Fracture of humeral shaft, right, closed 09/02/2016  . Diabetes ([place] 09/02/2016  . Essential hypertension, benign 09/02/2016    MJoneen BoersPT, DPT   04/17/2017, 9:11 AM  [facility] [address] [place] [address]Phone: [phone]  Fax:  [phone] Name: RMackenzy EisenbergMRN: [id number] of Birth: 902-03-1966

## 2017-04-17 NOTE — Patient Instructions (Signed)
  R lateral shift correction    Standing with your right side against the wall,     Gently move your right hip towards the wall on the right side in a small comfortable range    Hold for 5 seconds    Repeat 10 times    Perform 2 sets daily.

## 2017-04-24 ENCOUNTER — Ambulatory Visit: Payer: No Typology Code available for payment source

## 2017-04-26 ENCOUNTER — Ambulatory Visit: Payer: No Typology Code available for payment source

## 2017-04-27 MED FILL — $LANTUS 100 UNITS/ML VIAL: 100 | 25 days supply | Qty: 10 | Fill #1

## 2017-04-27 MED FILL — $HUMALOG 100 UNITS/ML KWIKP: 100 | 30 days supply | Qty: 15 | Fill #0

## 2017-04-27 MED FILL — LISINOPRIL 10 MG TABS: 10 | 30 days supply | Qty: 30 | Fill #2

## 2017-04-27 MED FILL — GABAPENTIN 300 MG CAPSULE: 300 | 30 days supply | Qty: 30 | Fill #7

## 2017-05-03 ENCOUNTER — Ambulatory Visit: Payer: No Typology Code available for payment source

## 2017-05-03 ENCOUNTER — Telehealth: Payer: Self-pay | Admitting: Neurology

## 2017-05-03 ENCOUNTER — Telehealth: Payer: Self-pay

## 2017-05-03 NOTE — Telephone Encounter (Signed)
Received this message from [employer]: "I will let you know as soon as I get approval for a machine for him and are able to get him set up.  We just received his financial assistance application yesterday."

## 2017-05-03 NOTE — Telephone Encounter (Signed)
No show. Called patient pertaining to today's appointment and his past 2 sessions that he has not shown up for. Return phone call requested. Phone number [phone]) provided.

## 2017-05-03 NOTE — Telephone Encounter (Signed)
Pt called said he is covered under [facility] Assistance Program and ha snot been able to get the CPAP yet.  [employer] requested he send them all his financial information that was sent to [facility] to determine if they could help him with the CPAP cost. He sent that in August and is waiting for an answer and will call back to advise once he knows.  At this time the appt has been for 10/1 has been c/a.  FYI

## 2017-05-03 NOTE — Telephone Encounter (Signed)
Noted, I have reached out to [employer] and they will let me know when this is resolved and when pt should follow up on his cpap with [place].

## 2017-05-08 ENCOUNTER — Ambulatory Visit: Payer: Self-pay | Admitting: Neurology

## 2017-05-22 ENCOUNTER — Telehealth: Payer: Self-pay

## 2017-05-22 NOTE — Telephone Encounter (Signed)
Pt contacted the office and stated he lost his glucometer. Pt is requesting a new one. Pt is requesting tht his meter be sent [facility] on [address] in [place]

## 2017-05-23 MED ORDER — FREESTYLE SYSTEM KIT: 1.0000 | PACK | Freq: Three times a day (TID) | 0 refills | Status: DC

## 2017-05-23 NOTE — Telephone Encounter (Signed)
Meter sent to requested pharmacy.

## 2017-05-30 ENCOUNTER — Ambulatory Visit (HOSPITAL_COMMUNITY)
Admission: EM | Admit: 2017-05-30 | Discharge: 2017-05-30 | Disposition: A | Discharge status: Home / Self Care | Payer: No Typology Code available for payment source | Attending: Emergency Medicine | Admitting: Emergency Medicine

## 2017-05-30 ENCOUNTER — Ambulatory Visit (INDEPENDENT_AMBULATORY_CARE_PROVIDER_SITE_OTHER): Payer: No Typology Code available for payment source

## 2017-05-30 ENCOUNTER — Encounter (HOSPITAL_COMMUNITY): Payer: Self-pay | Admitting: Emergency Medicine

## 2017-05-30 DIAGNOSIS — R05 Cough: Secondary | ICD-10-CM

## 2017-05-30 DIAGNOSIS — R0781 Pleurodynia: Secondary | ICD-10-CM

## 2017-05-30 DIAGNOSIS — S29011A Strain of muscle and tendon of front wall of thorax, initial encounter: Secondary | ICD-10-CM

## 2017-05-30 MED ORDER — CYCLOBENZAPRINE HCL 10 MG PO TABS: 10.0000 mg | ORAL_TABLET | Freq: Two times a day (BID) | ORAL | 0 refills | Status: DC | PRN

## 2017-05-30 NOTE — ED Provider Notes (Signed)
[facility]    CSN: [id number] Arrival date & time: 05/30/17  1359     History   Chief Complaint Chief Complaint  Patient presents with  . Rib Injury    HPI [name] is a 51 y.o. male.   51 yr old male pt presents to UC with cc of left lateral chest wall pain after coughing 4 days prior(recently had URI). Pt denies direct blow or trauma to area. Pain w palpation, deep breath,movement.    The history is provided by the patient. No language interpreter was used.    Past Medical History:  Diagnosis Date  . Diabetes mellitus without complication ([place])   . Fracture closed, humerus    right  . Fracture of humeral shaft, right, closed 09/02/2016  . Fracture of humerus, proximal, right, closed 09/03/2016  . Hypertension     Patient Active Problem List   Diagnosis Date Noted  . Rib pain on left side 05/30/2017  . Chest wall muscle strain, initial encounter 05/30/2017  . Fracture of humerus, proximal, right, closed 09/03/2016  . Fracture of humeral shaft, right, closed 09/02/2016  . Diabetes ([place]) 09/02/2016  . Essential hypertension, benign 09/02/2016    Past Surgical History:  Procedure Laterality Date  . CHOLECYSTECTOMY    . ORIF HUMERUS FRACTURE Right 09/02/2016   Procedure: OPEN REDUCTION INTERNAL FIXATION (ORIF) RIGHT  HUMERAL SHAFT FRACTURE;  Surgeon: Altamese St. Meinrad, MD;  Location: [facility];  Service: Orthopedics;  Laterality: Right;  . WISDOM TOOTH EXTRACTION         Home Medications    Prior to Admission medications   Medication Sig Start Date End Date Taking? Authorizing Provider  gabapentin (NEURONTIN) 300 MG capsule Take 1 capsule (300 mg total) by mouth 2 (two) times daily. 11/09/16  Yes [name], Toy Baker R, FNP  glucose blood (TRUE METRIX BLOOD GLUCOSE TEST) test strip Use as instructed 11/03/16  Yes Jegede, Olugbemiga E, MD  glucose monitoring kit (FREESTYLE) monitoring kit 1 each by Does not apply route 4 (four) times daily - after meals and  at bedtime. E11.9 05/23/17  Yes [name], Mandesia R, FNP  insulin aspart (NOVOLOG FLEXPEN) 100 UNIT/ML FlexPen 0-15 Units, Subcutaneous, 3 times daily with meals CBG < 70: implement hypoglycemia protocol-call MD CBG 70 - 120: 0 units CBG 121 - 150: 2 units CBG 151 - 200: 3 units CBG 201 - 250: 5 units CBG 251 - 300: 8 units CBG 301 - 350: 11 units CBG 351 - 400: 15 units CBG > 400: 10/10/16  Yes Jegede, Olugbemiga E, MD  Insulin Glargine (LANTUS SOLOSTAR) 100 UNIT/ML Solostar Pen Inject 40 Units into the skin daily at 10 pm. 03/01/17  Yes [name], Mandesia R, FNP  insulin lispro (HUMALOG KWIKPEN) 100 UNIT/ML KiwkPen Inject 0-0.15 mLs (0-15 Units total) into the skin 3 (three) times daily with meals. 10/24/16  Yes Tresa Garter, MD  Insulin Pen Needle 32G X 8 MM MISC Use as directed 03/01/17  Yes [name], Mandesia R, FNP  lisinopril (PRINIVIL,ZESTRIL) 10 MG tablet Take 1 tablet (10 mg total) by mouth daily. 12/01/16  Yes Alfonse Spruce, FNP  metFORMIN (GLUCOPHAGE XR) 500 MG 24 hr tablet Take 2 tablets (1,000 mg total) by mouth 2 (two) times daily with a meal. 10/06/16  Yes Tresa Garter, MD  TRUEPLUS LANCETS 28G MISC Use as directed 11/03/16  Yes Jegede, Olugbemiga E, MD  cyclobenzaprine (FLEXERIL) 10 MG tablet Take 1 tablet (10 mg total) by mouth 2 (two) times  daily as needed for muscle spasms. 09/32/35   Jaylina Ramdass, Jeanett Schlein, NP  diclofenac (VOLTAREN) 75 MG EC tablet Take 1 tablet (75 mg total) by mouth 2 (two) times daily. 12/26/16   Alfonse Spruce, FNP    Family History Family History  Problem Relation Age of Onset  . Sjogren's syndrome Mother   . CAD Father   . Diabetes Father     Social History Social History  Substance Use Topics  . Smoking status: Former Smoker    Quit date: 08/09/2011  . Smokeless tobacco: Never Used  . Alcohol use Yes     Comment: 3 drinks per week     Allergies   No known allergies   Review of Systems Review of Systems    Constitutional: Negative for chills and fever.  HENT: Negative for ear pain and sore throat.   Eyes: Negative for pain and visual disturbance.  Respiratory: Negative for cough and shortness of breath.   Cardiovascular: Negative for chest pain and palpitations.  Gastrointestinal: Negative for abdominal pain and vomiting.  Genitourinary: Negative for dysuria and hematuria.  Musculoskeletal: Positive for myalgias. Negative for arthralgias and back pain.  Skin: Negative for color change and rash.  Neurological: Negative for seizures and syncope.  All other systems reviewed and are negative.    Physical Exam Triage Vital Signs ED Triage Vitals  Enc Vitals Group     BP 05/30/17 1425 (!) 147/85     Pulse Rate 05/30/17 1425 100     Resp 05/30/17 1425 20     Temp 05/30/17 1425 98.6 F (37 C)     Temp src --      SpO2 05/30/17 1425 100 %     Weight --      Height --      Head Circumference --      Peak Flow --      Pain Score 05/30/17 1426 8     Pain Loc --      Pain Edu? --      Excl. in [place]? --    No data found.   Updated Vital Signs BP (!) 147/85   Pulse 100   Temp 98.6 F (37 C)   Resp 20   SpO2 100%   Visual Acuity Right Eye Distance:   Left Eye Distance:   Bilateral Distance:    Right Eye Near:   Left Eye Near:    Bilateral Near:     Physical Exam  Constitutional: He is oriented to person, place, and time. He appears well-developed and well-nourished. He is active and cooperative.  HENT:  Head: Normocephalic and atraumatic.  Eyes: Conjunctivae are normal.  Neck: Neck supple.  Cardiovascular: Normal rate, regular rhythm and normal pulses.   No murmur heard. Pulmonary/Chest: Effort normal and breath sounds normal. No respiratory distress. He exhibits tenderness.    Abdominal: Soft. There is no tenderness.  Musculoskeletal: He exhibits no edema.  Neurological: He is alert and oriented to person, place, and time. GCS eye subscore is 4. GCS verbal subscore  is 5. GCS motor subscore is 6.  Skin: Skin is warm and dry.  Psychiatric: He has a normal mood and affect.  Nursing note and vitals reviewed.    UC Treatments / Results  Labs (all labs ordered are listed, but only abnormal results are displayed) Labs Reviewed - No data to display  EKG  EKG Interpretation None       Radiology Dg Ribs Unilateral W/chest Left  Result  Date: 05/30/2017 CLINICAL DATA:  Acute left-sided rib pain without known injury. EXAM: LEFT RIBS AND CHEST - 3+ VIEW COMPARISON:  Radiographs of September 02, 2016. FINDINGS: No fracture or other bone lesions are seen involving the ribs. There is no evidence of pneumothorax or pleural effusion. Both lungs are clear. Heart size and mediastinal contours are within normal limits. IMPRESSION: Normal left ribs.  No acute cardiopulmonary abnormality seen. Electronically Signed   By: Marijo Conception, M.D.   On: 05/30/2017 15:42    Procedures Procedures (including critical care time)  Medications Ordered in UC Medications - No data to display   Initial Impression / Assessment and Plan / UC Course  I have reviewed the triage vital signs and the nursing notes.  Pertinent labs & imaging results that were available during my care of the patient were reviewed by me and considered in my medical decision making (see chart for details).      Your xray was negative for subluxation, fracture. Rest,push fluids, may use otc lidocaine patch for discomfort. Take muscle relaxer for spasm. Follow up with PCP in 2-3 days if no improvement. Return to Er for new or worsening issues.  Final Clinical Impressions(s) / UC Diagnoses   Final diagnoses:  Rib pain on left side  Chest wall muscle strain, initial encounter    New Prescriptions Discharge Medication List as of 05/30/2017  4:03 PM    START taking these medications   Details  cyclobenzaprine (FLEXERIL) 10 MG tablet Take 1 tablet (10 mg total) by mouth 2 (two) times daily as  needed for muscle spasms., Starting Tue 05/30/2017, Normal         Controlled Substance Prescriptions    Kenecia Barren, Jeanett Schlein, NP 66/06/00 1652

## 2017-05-30 NOTE — ED Triage Notes (Signed)
Pt states he feels like he might have broken a rib, woke up 4 days ago with stabbing pain to his left rib, he coughed and felt a pop in his L rib area. Pt has pinpoint tenderness to L rib area.

## 2017-05-30 NOTE — Discharge Instructions (Signed)
Your xray was negative for subluxation, fracture. Rest,push fluids, may use otc lidocaine patch for discomfort. Take muscle relaxer for spasm. Follow up with PCP in 2-3 days if no improvement. Return to Er for new or worsening issues.

## 2017-06-13 ENCOUNTER — Ambulatory Visit (HOSPITAL_COMMUNITY)
Admission: EM | Admit: 2017-06-13 | Discharge: 2017-06-13 | Disposition: A | Discharge status: Home / Self Care | Payer: No Typology Code available for payment source

## 2017-06-13 ENCOUNTER — Other Ambulatory Visit: Payer: Self-pay

## 2017-06-13 ENCOUNTER — Emergency Department (HOSPITAL_COMMUNITY): Payer: Self-pay

## 2017-06-13 ENCOUNTER — Encounter (HOSPITAL_COMMUNITY): Payer: Self-pay

## 2017-06-13 ENCOUNTER — Inpatient Hospital Stay (HOSPITAL_COMMUNITY)
Admission: EM | Admit: 2017-06-13 | Discharge: 2017-06-18 | DRG: 617 | Disposition: A | Discharge status: Home Health | Payer: Self-pay | Attending: Internal Medicine | Admitting: Internal Medicine

## 2017-06-13 DIAGNOSIS — Z6839 Body mass index (BMI) 39.0-39.9, adult: Secondary | ICD-10-CM

## 2017-06-13 DIAGNOSIS — Z9119 Patient's noncompliance with other medical treatment and regimen: Secondary | ICD-10-CM

## 2017-06-13 DIAGNOSIS — Z794 Long term (current) use of insulin: Secondary | ICD-10-CM

## 2017-06-13 DIAGNOSIS — D62 Acute posthemorrhagic anemia: Secondary | ICD-10-CM | POA: Diagnosis not present

## 2017-06-13 DIAGNOSIS — I10 Essential (primary) hypertension: Secondary | ICD-10-CM | POA: Diagnosis present

## 2017-06-13 DIAGNOSIS — Z87891 Personal history of nicotine dependence: Secondary | ICD-10-CM

## 2017-06-13 DIAGNOSIS — W228XXA Striking against or struck by other objects, initial encounter: Secondary | ICD-10-CM | POA: Diagnosis present

## 2017-06-13 DIAGNOSIS — Z9114 Patient's other noncompliance with medication regimen: Secondary | ICD-10-CM

## 2017-06-13 DIAGNOSIS — L0390 Cellulitis, unspecified: Secondary | ICD-10-CM | POA: Diagnosis present

## 2017-06-13 DIAGNOSIS — L03031 Cellulitis of right toe: Secondary | ICD-10-CM | POA: Diagnosis present

## 2017-06-13 DIAGNOSIS — E1169 Type 2 diabetes mellitus with other specified complication: Principal | ICD-10-CM | POA: Diagnosis present

## 2017-06-13 DIAGNOSIS — M86171 Other acute osteomyelitis, right ankle and foot: Secondary | ICD-10-CM

## 2017-06-13 DIAGNOSIS — E11621 Type 2 diabetes mellitus with foot ulcer: Secondary | ICD-10-CM | POA: Diagnosis present

## 2017-06-13 DIAGNOSIS — Z79899 Other long term (current) drug therapy: Secondary | ICD-10-CM

## 2017-06-13 DIAGNOSIS — Z23 Encounter for immunization: Secondary | ICD-10-CM

## 2017-06-13 DIAGNOSIS — L97514 Non-pressure chronic ulcer of other part of right foot with necrosis of bone: Secondary | ICD-10-CM | POA: Diagnosis present

## 2017-06-13 DIAGNOSIS — E1142 Type 2 diabetes mellitus with diabetic polyneuropathy: Secondary | ICD-10-CM | POA: Diagnosis present

## 2017-06-13 DIAGNOSIS — Z7984 Long term (current) use of oral hypoglycemic drugs: Secondary | ICD-10-CM

## 2017-06-13 DIAGNOSIS — L03115 Cellulitis of right lower limb: Secondary | ICD-10-CM | POA: Diagnosis present

## 2017-06-13 DIAGNOSIS — E669 Obesity, unspecified: Secondary | ICD-10-CM | POA: Diagnosis present

## 2017-06-13 DIAGNOSIS — Z833 Family history of diabetes mellitus: Secondary | ICD-10-CM

## 2017-06-13 DIAGNOSIS — E1165 Type 2 diabetes mellitus with hyperglycemia: Secondary | ICD-10-CM | POA: Diagnosis present

## 2017-06-13 LAB — COMPREHENSIVE METABOLIC PANEL
ALT: 21 U/L (ref 17–63)
AST: 19 U/L (ref 15–41)
Albumin: 3.4 g/dL — ABNORMAL LOW (ref 3.5–5.0)
Alkaline Phosphatase: 88 U/L (ref 38–126)
Anion gap: 11 (ref 5–15)
BUN: 9 mg/dL (ref 6–20)
CHLORIDE: 101 mmol/L (ref 101–111)
CO2: 24 mmol/L (ref 22–32)
Calcium: 9.3 mg/dL (ref 8.9–10.3)
Creatinine, Ser: 0.65 mg/dL (ref 0.61–1.24)
Glucose, Bld: 128 mg/dL — ABNORMAL HIGH (ref 65–99)
POTASSIUM: 3.5 mmol/L (ref 3.5–5.1)
Sodium: 136 mmol/L (ref 135–145)
Total Bilirubin: 0.5 mg/dL (ref 0.3–1.2)
Total Protein: 7.8 g/dL (ref 6.5–8.1)

## 2017-06-13 LAB — CBC WITH DIFFERENTIAL/PLATELET
Basophils Absolute: 0 10*3/uL (ref 0.0–0.1)
Basophils Relative: 0 %
Eosinophils Absolute: 0.2 10*3/uL (ref 0.0–0.7)
Eosinophils Relative: 1 %
HCT: 40 % (ref 39.0–52.0)
HEMOGLOBIN: 13.1 g/dL (ref 13.0–17.0)
LYMPHS ABS: 2.9 10*3/uL (ref 0.7–4.0)
LYMPHS PCT: 21 %
MCH: 27.1 pg (ref 26.0–34.0)
MCHC: 32.8 g/dL (ref 30.0–36.0)
MCV: 82.6 fL (ref 78.0–100.0)
Monocytes Absolute: 0.9 10*3/uL (ref 0.1–1.0)
Monocytes Relative: 6 %
NEUTROS PCT: 72 %
Neutro Abs: 9.9 10*3/uL — ABNORMAL HIGH (ref 1.7–7.7)
Platelets: 288 10*3/uL (ref 150–400)
RBC: 4.84 MIL/uL (ref 4.22–5.81)
RDW: 14.1 % (ref 11.5–15.5)
WBC: 13.9 10*3/uL — AB (ref 4.0–10.5)

## 2017-06-13 LAB — I-STAT CG4 LACTIC ACID, ED: LACTIC ACID, VENOUS: 1.09 mmol/L (ref 0.5–1.9)

## 2017-06-13 NOTE — ED Triage Notes (Signed)
Right middle toe infection.  Stumped toe a week ago on rocks and now, tissue is white at tip of toe and underside of toe.  Top of toe is red.  Notified amy, pa to review.  Patient going to the ed for care

## 2017-06-13 NOTE — ED Triage Notes (Signed)
Pt arrived from urgent care pt states he stubbed the 2nd toe on his right foot about a week ago the scab came off and the toe remained open with skin wearing away from wound.

## 2017-06-14 ENCOUNTER — Encounter (HOSPITAL_COMMUNITY): Payer: Self-pay | Admitting: Internal Medicine

## 2017-06-14 ENCOUNTER — Observation Stay (HOSPITAL_COMMUNITY): Payer: Self-pay

## 2017-06-14 ENCOUNTER — Other Ambulatory Visit: Payer: Self-pay

## 2017-06-14 ENCOUNTER — Other Ambulatory Visit: Payer: Self-pay | Admitting: Family Medicine

## 2017-06-14 DIAGNOSIS — L0390 Cellulitis, unspecified: Secondary | ICD-10-CM | POA: Diagnosis present

## 2017-06-14 DIAGNOSIS — E118 Type 2 diabetes mellitus with unspecified complications: Secondary | ICD-10-CM

## 2017-06-14 DIAGNOSIS — E669 Obesity, unspecified: Secondary | ICD-10-CM

## 2017-06-14 DIAGNOSIS — I10 Essential (primary) hypertension: Secondary | ICD-10-CM

## 2017-06-14 DIAGNOSIS — L03115 Cellulitis of right lower limb: Secondary | ICD-10-CM | POA: Diagnosis present

## 2017-06-14 DIAGNOSIS — E1169 Type 2 diabetes mellitus with other specified complication: Secondary | ICD-10-CM | POA: Diagnosis present

## 2017-06-14 DIAGNOSIS — Z794 Long term (current) use of insulin: Principal | ICD-10-CM

## 2017-06-14 LAB — BASIC METABOLIC PANEL
Anion gap: 9 (ref 5–15)
BUN: 11 mg/dL (ref 6–20)
CALCIUM: 8.6 mg/dL — AB (ref 8.9–10.3)
CO2: 25 mmol/L (ref 22–32)
Chloride: 99 mmol/L — ABNORMAL LOW (ref 101–111)
Creatinine, Ser: 0.79 mg/dL (ref 0.61–1.24)
GFR calc Af Amer: >60 mL/min (ref >60)
GLUCOSE: 278 mg/dL — AB (ref 65–99)
Potassium: 3.5 mmol/L (ref 3.5–5.1)
SODIUM: 133 mmol/L — AB (ref 135–145)

## 2017-06-14 LAB — CBC
HCT: 37.2 % — ABNORMAL LOW (ref 39.0–52.0)
Hemoglobin: 11.9 g/dL — ABNORMAL LOW (ref 13.0–17.0)
MCH: 26.7 pg (ref 26.0–34.0)
MCHC: 32 g/dL (ref 30.0–36.0)
MCV: 83.6 fL (ref 78.0–100.0)
PLATELETS: 266 10*3/uL (ref 150–400)
RBC: 4.45 MIL/uL (ref 4.22–5.81)
RDW: 14.7 % (ref 11.5–15.5)
WBC: 11.5 10*3/uL — AB (ref 4.0–10.5)

## 2017-06-14 LAB — GLUCOSE, CAPILLARY
Glucose-Capillary: 317 mg/dL — ABNORMAL HIGH (ref 65–99)
Glucose-Capillary: 139 mg/dL — ABNORMAL HIGH (ref 65–99)
Glucose-Capillary: 199 mg/dL — ABNORMAL HIGH (ref 65–99)

## 2017-06-14 LAB — HIV ANTIBODY (ROUTINE TESTING W REFLEX): HIV SCREEN 4TH GENERATION: NONREACTIVE

## 2017-06-14 LAB — CBG MONITORING, ED: Glucose-Capillary: 184 mg/dL — ABNORMAL HIGH (ref 65–99)

## 2017-06-14 MED ORDER — VANCOMYCIN HCL IN DEXTROSE 1-5 GM/200ML-% IV SOLN
1000.0000 mg | Freq: Three times a day (TID) | INTRAVENOUS | Status: DC
  Administered 2017-06-14 – 2017-06-17 (×11): 1000 mg via INTRAVENOUS
  Filled 2017-06-14 (×12): qty 200

## 2017-06-14 MED ORDER — ONDANSETRON HCL 4 MG PO TABS: 4.0000 mg | ORAL_TABLET | Freq: Four times a day (QID) | ORAL | Status: DC | PRN

## 2017-06-14 MED ORDER — INSULIN LISPRO 100 UNIT/ML CARTRIDGE: 15.0000 [IU] | Freq: Three times a day (TID) | SUBCUTANEOUS | Status: DC

## 2017-06-14 MED ORDER — INSULIN ASPART 100 UNIT/ML ~~LOC~~ SOLN
15.0000 [IU] | Freq: Three times a day (TID) | SUBCUTANEOUS | Status: DC
  Administered 2017-06-14 – 2017-06-18 (×12): 15 [IU] via SUBCUTANEOUS

## 2017-06-14 MED ORDER — ONDANSETRON HCL 4 MG/2ML IJ SOLN: 4.0000 mg | Freq: Four times a day (QID) | INTRAMUSCULAR | Status: DC | PRN

## 2017-06-14 MED ORDER — VANCOMYCIN HCL IN DEXTROSE 1-5 GM/200ML-% IV SOLN
1000.0000 mg | Freq: Once | INTRAVENOUS | Status: AC
  Administered 2017-06-14: 1000 mg via INTRAVENOUS
  Filled 2017-06-14: qty 200

## 2017-06-14 MED ORDER — ACETAMINOPHEN 650 MG RE SUPP: 650.0000 mg | Freq: Four times a day (QID) | RECTAL | Status: DC | PRN

## 2017-06-14 MED ORDER — PIPERACILLIN-TAZOBACTAM 3.375 G IVPB
3.3750 g | Freq: Three times a day (TID) | INTRAVENOUS | Status: DC
  Administered 2017-06-14 – 2017-06-17 (×10): 3.375 g via INTRAVENOUS
  Filled 2017-06-14 (×13): qty 50

## 2017-06-14 MED ORDER — PNEUMOCOCCAL VAC POLYVALENT 25 MCG/0.5ML IJ INJ
0.5000 mL | INJECTION | INTRAMUSCULAR | Status: AC
  Administered 2017-06-15: 0.5 mL via INTRAMUSCULAR
  Filled 2017-06-14: qty 0.5

## 2017-06-14 MED ORDER — PIPERACILLIN-TAZOBACTAM 3.375 G IVPB 30 MIN
3.3750 g | Freq: Once | INTRAVENOUS | Status: AC
  Administered 2017-06-14: 3.375 g via INTRAVENOUS
  Filled 2017-06-14: qty 50

## 2017-06-14 MED ORDER — INSULIN ASPART 100 UNIT/ML ~~LOC~~ SOLN
0.0000 [IU] | Freq: Three times a day (TID) | SUBCUTANEOUS | Status: DC
  Administered 2017-06-14: 1 [IU] via SUBCUTANEOUS
  Administered 2017-06-14: 7 [IU] via SUBCUTANEOUS
  Administered 2017-06-15: 2 [IU] via SUBCUTANEOUS
  Administered 2017-06-15: 3 [IU] via SUBCUTANEOUS
  Administered 2017-06-15 – 2017-06-16 (×2): 2 [IU] via SUBCUTANEOUS
  Administered 2017-06-16: 3 [IU] via SUBCUTANEOUS
  Administered 2017-06-17: 2 [IU] via SUBCUTANEOUS
  Administered 2017-06-17 – 2017-06-18 (×3): 1 [IU] via SUBCUTANEOUS

## 2017-06-14 MED ORDER — INFLUENZA VAC SPLIT QUAD 0.5 ML IM SUSY
0.5000 mL | PREFILLED_SYRINGE | INTRAMUSCULAR | Status: AC
  Administered 2017-06-15: 0.5 mL via INTRAMUSCULAR
  Filled 2017-06-14: qty 0.5

## 2017-06-14 MED ORDER — GABAPENTIN 300 MG PO CAPS
300.0000 mg | ORAL_CAPSULE | Freq: Every day | ORAL | Status: DC
  Administered 2017-06-14 – 2017-06-17 (×4): 300 mg via ORAL
  Filled 2017-06-14 (×6): qty 1

## 2017-06-14 MED ORDER — INSULIN GLARGINE 100 UNIT/ML ~~LOC~~ SOLN
38.0000 [IU] | Freq: Every day | SUBCUTANEOUS | Status: DC
  Administered 2017-06-14 – 2017-06-17 (×4): 38 [IU] via SUBCUTANEOUS
  Filled 2017-06-14 (×5): qty 0.38

## 2017-06-14 MED ORDER — ACETAMINOPHEN 325 MG PO TABS: 650.0000 mg | ORAL_TABLET | Freq: Four times a day (QID) | ORAL | Status: DC | PRN

## 2017-06-14 MED ORDER — MORPHINE SULFATE (PF) 4 MG/ML IV SOLN
2.0000 mg | INTRAVENOUS | Status: DC | PRN
  Administered 2017-06-14 – 2017-06-18 (×15): 2 mg via INTRAVENOUS
  Filled 2017-06-14 (×15): qty 1

## 2017-06-14 MED ORDER — CYCLOBENZAPRINE HCL 10 MG PO TABS: 10.0000 mg | ORAL_TABLET | Freq: Two times a day (BID) | ORAL | Status: DC | PRN

## 2017-06-14 MED ORDER — LISINOPRIL 10 MG PO TABS
10.0000 mg | ORAL_TABLET | Freq: Every day | ORAL | Status: DC
  Administered 2017-06-14: 10 mg via ORAL
  Filled 2017-06-14 (×2): qty 1

## 2017-06-14 MED ORDER — MORPHINE SULFATE (PF) 4 MG/ML IV SOLN
2.0000 mg | Freq: Once | INTRAVENOUS | Status: AC
  Administered 2017-06-14: 2 mg via INTRAVENOUS
  Filled 2017-06-14: qty 1

## 2017-06-14 MED ORDER — KETOROLAC TROMETHAMINE 15 MG/ML IJ SOLN
15.0000 mg | Freq: Four times a day (QID) | INTRAMUSCULAR | Status: DC | PRN
  Administered 2017-06-14 – 2017-06-17 (×8): 15 mg via INTRAVENOUS
  Filled 2017-06-14 (×9): qty 1

## 2017-06-14 MED FILL — GABAPENTIN 300 MG CAPSULE: 300 | 30 days supply | Qty: 30 | Fill #8

## 2017-06-14 NOTE — ED Notes (Signed)
Patient transported to MRI 

## 2017-06-14 NOTE — ED Notes (Signed)
Placed on hospital bed at this time 

## 2017-06-14 NOTE — ED Notes (Signed)
Pt ambulating in hallway with no difficulty.  

## 2017-06-14 NOTE — Progress Notes (Signed)
Delman KittenRobert Leppanen is a 51 y.o. male patient admitted from ED awake, alert - oriented  X 4 - no acute distress noted.  VSS - Blood pressure 119/75, pulse 95, temperature 98.8 F (37.1 C), temperature source Oral, resp. rate 20, height 6' (1.829 m), weight 132.5 kg (292 lb), SpO2 97 %.    IV in place, occlusive dsg intact without redness.  Orientation to room, and floor completed with information packet given to patient/family.  Patient declined safety video at this time.  Admission INP armband ID verified with patient/family, and in place.   SR up x 2, fall assessment complete, with patient and family able to verbalize understanding of risk associated with falls, and verbalized understanding to call nsg before up out of bed.  Call light within reach, patient able to voice, and demonstrate understanding.  Skin, clean-dry- intact without evidence of bruising, or skin tears.  Pt has redness and swelling on his right foot. No evidence of skin break down noted on exam.     Will cont to eval and treat per MD orders.  Theodosia BlenderEvan J Bhakti Labella, RN 06/14/2017 11:50 AM

## 2017-06-14 NOTE — H&P (Signed)
History and Physical    [name] ZOX:[id number] DOB: 1966-03-12 DOA: 06/13/2017  PCP: Lizbeth Bark, FNP  Patient coming from: Home.  Chief Complaint: Right foot second toe erythema swelling and discharge.  HPI: [name] is a 51 y.o. male with history of diabetes mellitus type 2, hypertension presents to the ER with complaints of increasing pain swelling and erythema of the right foot second toe.  Patient states 10 days ago he hurt his leg with a rock.  Since then his right foot second toe has been getting more erythematous swollen painful with discharge.  Patient states his blood sugar also has been running high.  Few months ago he had stopped all his medications but last 2 weeks he has been very compliant.  Denies any fever chills.  ED Course: In the ER on exam patient's right foot second toe looks erythematous swollen with mild discharge.  X-rays do not reveal any definite osteomyelitis.  There was some red streaking on the foot and the anterior shin.  Patient admitted for IV antibiotics and further observation.  Review of Systems: As per HPI, rest all negative.   Past Medical History:  Diagnosis Date  . Diabetes mellitus without complication (HCC)   . Fracture closed, humerus    right  . Fracture of humeral shaft, right, closed 09/02/2016  . Fracture of humerus, proximal, right, closed 09/03/2016  . Hypertension     Past Surgical History:  Procedure Laterality Date  . CHOLECYSTECTOMY    . WISDOM TOOTH EXTRACTION       reports that he quit smoking about 5 years ago. he has never used smokeless tobacco. He reports that he drinks alcohol. He reports that he does not use drugs.  Allergies  Allergen Reactions  . No Known Allergies     Family History  Problem Relation Age of Onset  . Sjogren's syndrome Mother   . CAD Father   . Diabetes Father     Prior to Admission medications   Medication Sig Start Date End Date Taking? Authorizing Provider    cyclobenzaprine (FLEXERIL) 10 MG tablet Take 1 tablet (10 mg total) by mouth 2 (two) times daily as needed for muscle spasms. 05/30/17  Yes Defelice, Para March, NP  gabapentin (NEURONTIN) 300 MG capsule Take 1 capsule (300 mg total) by mouth 2 (two) times daily. Patient taking differently: Take 300 mg at bedtime by mouth.  11/09/16  Yes [name], Oren Beckmann, FNP  Insulin Glargine (LANTUS SOLOSTAR) 100 UNIT/ML Solostar Pen Inject 40 Units into the skin daily at 10 pm. Patient taking differently: Inject 38 Units daily at 10 pm into the skin.  03/01/17  Yes [name], Mandesia R, FNP  lisinopril (PRINIVIL,ZESTRIL) 10 MG tablet Take 1 tablet (10 mg total) by mouth daily. Patient taking differently: Take 10 mg at bedtime by mouth.  12/01/16  Yes [name], Oren Beckmann, FNP  metFORMIN (GLUCOPHAGE XR) 500 MG 24 hr tablet Take 2 tablets (1,000 mg total) by mouth 2 (two) times daily with a meal. Patient taking differently: Take 500 mg 2 (two) times daily with a meal by mouth.  10/06/16  Yes Quentin Angst, MD  diclofenac (VOLTAREN) 75 MG EC tablet Take 1 tablet (75 mg total) by mouth 2 (two) times daily. Patient not taking: Reported on 06/13/2017 12/26/16   Lizbeth Bark, FNP  HUMALOG 100 UNIT/ML cartridge Inject 15 mg 3 (three) times daily into the skin. 04/27/17   [provider]  insulin aspart (NOVOLOG FLEXPEN) 100 UNIT/ML  FlexPen 0-15 Units, Subcutaneous, 3 times daily with meals CBG < 70: implement hypoglycemia protocol-call MD CBG 70 - 120: 0 units CBG 121 - 150: 2 units CBG 151 - 200: 3 units CBG 201 - 250: 5 units CBG 251 - 300: 8 units CBG 301 - 350: 11 units CBG 351 - 400: 15 units CBG > 400: 10/10/16   Jegede, Olugbemiga E, MD  insulin lispro (HUMALOG KWIKPEN) 100 UNIT/ML KiwkPen Inject 0-0.15 mLs (0-15 Units total) into the skin 3 (three) times daily with meals. Patient not taking: Reported on 06/13/2017 10/24/16   Quentin AngstJegede, Olugbemiga E, MD  TRUEPLUS LANCETS 28G MISC Use as  directed Patient not taking: Reported on 06/13/2017 11/03/16   Quentin AngstJegede, Olugbemiga E, MD    Physical Exam: Vitals:   06/13/17 1950 06/13/17 2230 06/13/17 2300 06/14/17 0013  BP: (!) 141/88 135/86 132/76 129/80  Pulse: (!) 110 100 96 (!) 107  Resp: 17  16   Temp: 98.5 F (36.9 C)     TempSrc: Oral     SpO2: 100% 99% 95% 96%  Weight: 132.5 kg (292 lb)     Height: 6' (1.829 m)         Constitutional: Moderately built and nourished. Vitals:   06/13/17 1950 06/13/17 2230 06/13/17 2300 06/14/17 0013  BP: (!) 141/88 135/86 132/76 129/80  Pulse: (!) 110 100 96 (!) 107  Resp: 17  16   Temp: 98.5 F (36.9 C)     TempSrc: Oral     SpO2: 100% 99% 95% 96%  Weight: 132.5 kg (292 lb)     Height: 6' (1.829 m)      Eyes: Anicteric no pallor. ENMT: No discharge from the ears eyes nose or mouth. Neck: No mass felt.  No neck rigidity. Respiratory: No rhonchi or crepitations. Cardiovascular: S1-S2 heard. Abdomen: Soft nontender bowel sounds present. Musculoskeletal: Swelling and erythema with mild discharge of the right foot second toe. Skin: Erythema of the second toe of the right foot. Neurologic: Alert awake oriented to time place and person.  Moves all extremities. Psychiatric: Appears normal.  Normal affect.   Labs on Admission: I have personally reviewed following labs and imaging studies  CBC: Recent Labs  Lab 06/13/17 1953  WBC 13.9*  NEUTROABS 9.9*  HGB 13.1  HCT 40.0  MCV 82.6  PLT 288   Basic Metabolic Panel: Recent Labs  Lab 06/13/17 1953  NA 136  K 3.5  CL 101  CO2 24  GLUCOSE 128*  BUN 9  CREATININE 0.65  CALCIUM 9.3   GFR: Estimated Creatinine Clearance: 155.6 mL/min (by C-G formula based on SCr of 0.65 mg/dL). Liver Function Tests: Recent Labs  Lab 06/13/17 1953  AST 19  ALT 21  ALKPHOS 88  BILITOT 0.5  PROT 7.8  ALBUMIN 3.4*   No results for input(s): LIPASE, AMYLASE in the last 168 hours. No results for input(s): AMMONIA in the last 168  hours. Coagulation Profile: No results for input(s): INR, PROTIME in the last 168 hours. Cardiac Enzymes: No results for input(s): CKTOTAL, CKMB, CKMBINDEX, TROPONINI in the last 168 hours. BNP (last 3 results) No results for input(s): PROBNP in the last 8760 hours. HbA1C: No results for input(s): HGBA1C in the last 72 hours. CBG: No results for input(s): GLUCAP in the last 168 hours. Lipid Profile: No results for input(s): CHOL, HDL, LDLCALC, TRIG, CHOLHDL, LDLDIRECT in the last 72 hours. Thyroid Function Tests: No results for input(s): TSH, T4TOTAL, FREET4, T3FREE, THYROIDAB in the last  72 hours. Anemia Panel: No results for input(s): VITAMINB12, FOLATE, FERRITIN, TIBC, IRON, RETICCTPCT in the last 72 hours. Urine analysis:    Component Value Date/Time   COLORURINE YELLOW 09/02/2016 1300   APPEARANCEUR CLEAR 09/02/2016 1300   LABSPEC 1.025 09/02/2016 1300   PHURINE 5.0 09/02/2016 1300   GLUCOSEU >=500 (A) 09/02/2016 1300   HGBUR NEGATIVE 09/02/2016 1300   BILIRUBINUR NEGATIVE 09/02/2016 1300   KETONESUR 5 (A) 09/02/2016 1300   PROTEINUR NEGATIVE 09/02/2016 1300   NITRITE NEGATIVE 09/02/2016 1300   LEUKOCYTESUR NEGATIVE 09/02/2016 1300   Sepsis Labs: @LABRCNTIP (procalcitonin:4,lacticidven:4) )No results found for this or any previous visit (from the past 240 hour(s)).   Radiological Exams on Admission: Dg Toe 2nd Right  Result Date: 06/13/2017 CLINICAL DATA:  Stubbed toe on a rock, pain and swelling EXAM: RIGHT SECOND TOE COMPARISON:  None. FINDINGS: Limited evaluation of the distal digit due to flexed positioning of the toe. No obvious fracture. Soft tissue injury distal portion of the digit. No obvious bone destruction. No radiopaque foreign body. IMPRESSION: Limited by positioning.  No obvious fracture or bone destruction. Electronically Signed   By: Jasmine PangKim  Fujinaga M.D.   On: 06/13/2017 23:29     Assessment/Plan Principal Problem:   Cellulitis of right foot Active  Problems:   Essential hypertension, benign   Cellulitis   Diabetes mellitus type 2 in obese (HCC)    1. Cellulitis of the right foot second toe -features are concerning for osteomyelitis for which we will get MRI.  Will keep patient on vancomycin and Zosyn for now.  Based on MRI results and clinical course further recommendations. 2. Diabetes mellitus type 2 with hyperglycemia -patient states self recently has been noncompliant but last 2 weeks he has been very rigid with his medication regimen.  Patient is on Lantus we will keep patient on sliding scale coverage also. 3. Hypertension on lisinopril.   DVT prophylaxis: SCDs for now Code Status: Full code. Family Communication: Discussed with patient. Disposition Plan: Home. Consults called: None. Admission status: Observation.   Eduard ClosKAKRAKANDY,Jonella Redditt N. MD [facility] Pager [phone].  If 7PM-7AM, please contact night-coverage [email or web address] Password [email or web address]  06/14/2017, 1:12 AM

## 2017-06-14 NOTE — Care Management Note (Signed)
Case Management Note  Patient Details  Name: Delman KittenRobert Clock MRN: [id number] Date of Birth: 12-Feb-1966  Subjective/Objective:    Pt admitted on 06/13/17 with cellulitis of RT foot.  PTA, pt independent, works as a Investment banker, operationalchef.  He lives with his sister.  [name]                Action/Plan: Sister able to provide assist at dc.  Will follow for dc needs as pt progresses.    Expected Discharge Date:                  Expected Discharge Plan:  Home/Self Care  In-House Referral:     Discharge planning Services  CM Consult  Post Acute Care Choice:    Choice offered to:     DME Arranged:    DME Agency:     HH Arranged:    HH Agency:     Status of Service:  In process, will continue to follow  If discussed at Long Length of Stay Meetings, dates discussed:    Additional Comments:  Quintella BatonJulie W. Lathon Adan, RN, BSN  Trauma/Neuro ICU Case Manager [phone]

## 2017-06-14 NOTE — ED Provider Notes (Signed)
[facility] EMERGENCY DEPARTMENT Provider Note   CSN: [id number] Arrival date & time: 06/13/17  1941     History   Chief Complaint No chief complaint on file.   HPI Pleas [name] is a 51 y.o. male.  HPI Patient presents to the emergency department with complaints of toe injury and swelling that occurred 1 week ago.  Patient states that he stubbed his toe and had a scabbed area that fell off he started noticing redness and swelling to the toe.  Patient states that nothing seemed to make the condition better or worse patient denies fever nausea vomiting weakness dizziness headache blurred, abdominal pain or syncope.  Patient states that the scabbed area came off and he noticed drainage from that area and a large wound to the end of the toe.  The patient states the toe is also red and swollen Past Medical History:  Diagnosis Date  . Diabetes mellitus without complication (HCC)   . Fracture closed, humerus    right  . Fracture of humeral shaft, right, closed 09/02/2016  . Fracture of humerus, proximal, right, closed 09/03/2016  . Hypertension     Patient Active Problem List   Diagnosis Date Noted  . Cellulitis 06/14/2017  . Cellulitis of right foot 06/14/2017  . Diabetes mellitus type 2 in obese (HCC) 06/14/2017  . Rib pain on left side 05/30/2017  . Chest wall muscle strain, initial encounter 05/30/2017  . Fracture of humerus, proximal, right, closed 09/03/2016  . Fracture of humeral shaft, right, closed 09/02/2016  . Diabetes (HCC) 09/02/2016  . Essential hypertension, benign 09/02/2016    Past Surgical History:  Procedure Laterality Date  . CHOLECYSTECTOMY    . WISDOM TOOTH EXTRACTION         Home Medications    Prior to Admission medications   Medication Sig Start Date End Date Taking? Authorizing Provider  cyclobenzaprine (FLEXERIL) 10 MG tablet Take 1 tablet (10 mg total) by mouth 2 (two) times daily as needed for muscle spasms. 05/30/17  Yes  Defelice, Para March, NP  gabapentin (NEURONTIN) 300 MG capsule Take 1 capsule (300 mg total) by mouth 2 (two) times daily. Patient taking differently: Take 300 mg at bedtime by mouth.  11/09/16  Yes [name], Oren Beckmann, FNP  Insulin Glargine (LANTUS SOLOSTAR) 100 UNIT/ML Solostar Pen Inject 40 Units into the skin daily at 10 pm. Patient taking differently: Inject 38 Units daily at 10 pm into the skin.  03/01/17  Yes [name], Mandesia R, FNP  lisinopril (PRINIVIL,ZESTRIL) 10 MG tablet Take 1 tablet (10 mg total) by mouth daily. Patient taking differently: Take 10 mg at bedtime by mouth.  12/01/16  Yes [name], Oren Beckmann, FNP  metFORMIN (GLUCOPHAGE XR) 500 MG 24 hr tablet Take 2 tablets (1,000 mg total) by mouth 2 (two) times daily with a meal. Patient taking differently: Take 500 mg 2 (two) times daily with a meal by mouth.  10/06/16  Yes Quentin Angst, MD  diclofenac (VOLTAREN) 75 MG EC tablet Take 1 tablet (75 mg total) by mouth 2 (two) times daily. Patient not taking: Reported on 06/13/2017 12/26/16   Lizbeth Bark, FNP  HUMALOG 100 UNIT/ML cartridge Inject 15 mg 3 (three) times daily into the skin. 04/27/17   [provider]  insulin aspart (NOVOLOG FLEXPEN) 100 UNIT/ML FlexPen 0-15 Units, Subcutaneous, 3 times daily with meals CBG < 70: implement hypoglycemia protocol-call MD CBG 70 - 120: 0 units CBG 121 - 150: 2 units CBG 151 -  200: 3 units CBG 201 - 250: 5 units CBG 251 - 300: 8 units CBG 301 - 350: 11 units CBG 351 - 400: 15 units CBG > 400: 10/10/16   Jegede, Olugbemiga E, MD  insulin lispro (HUMALOG KWIKPEN) 100 UNIT/ML KiwkPen Inject 0-0.15 mLs (0-15 Units total) into the skin 3 (three) times daily with meals. Patient not taking: Reported on 06/13/2017 10/24/16   Quentin AngstJegede, Olugbemiga E, MD  TRUEPLUS LANCETS 28G MISC Use as directed Patient not taking: Reported on 06/13/2017 11/03/16   Quentin AngstJegede, Olugbemiga E, MD    Family History Family History  Problem Relation Age of  Onset  . Sjogren's syndrome Mother   . CAD Father   . Diabetes Father     Social History Social History   Tobacco Use  . Smoking status: Former Smoker    Last attempt to quit: 08/09/2011    Years since quitting: 5.8  . Smokeless tobacco: Never Used  Substance Use Topics  . Alcohol use: Yes    Comment: 3 drinks per week  . Drug use: No     Allergies   No known allergies   Review of Systems Review of Systems All other systems negative except as documented in the HPI. All pertinent positives and negatives as reviewed in the HPI.  Physical Exam Updated Vital Signs BP (!) 109/52   Pulse 92   Temp 98.8 F (37.1 C) (Oral)   Resp 18   Ht 6' (1.829 m)   Wt 132.5 kg (292 lb)   SpO2 93%   BMI 39.60 kg/m   Physical Exam  Constitutional: He is oriented to person, place, and time. He appears well-developed and well-nourished. No distress.  HENT:  Head: Normocephalic and atraumatic.  Neck: Normal range of motion. Neck supple.  Cardiovascular: Normal rate, regular rhythm and normal heart sounds. Exam reveals no gallop and no friction rub.  No murmur heard. Pulmonary/Chest: Effort normal and breath sounds normal. No respiratory distress. He has no wheezes.  Musculoskeletal:       Feet:  Neurological: He is alert and oriented to person, place, and time. He exhibits normal muscle tone. Coordination normal.  Skin: Skin is warm and dry. Capillary refill takes less than 2 seconds. No rash noted. No erythema.  Psychiatric: He has a normal mood and affect. His behavior is normal.  Nursing note and vitals reviewed.    ED Treatments / Results  Labs (all labs ordered are listed, but only abnormal results are displayed) Labs Reviewed  COMPREHENSIVE METABOLIC PANEL - Abnormal; Notable for the following components:      Result Value   Glucose, Bld 128 (*)    Albumin 3.4 (*)    All other components within normal limits  CBC WITH DIFFERENTIAL/PLATELET - Abnormal; Notable for the  following components:   WBC 13.9 (*)    Neutro Abs 9.9 (*)    All other components within normal limits  BASIC METABOLIC PANEL - Abnormal; Notable for the following components:   Sodium 133 (*)    Chloride 99 (*)    Glucose, Bld 278 (*)    Calcium 8.6 (*)    All other components within normal limits  CBC - Abnormal; Notable for the following components:   WBC 11.5 (*)    Hemoglobin 11.9 (*)    HCT 37.2 (*)    All other components within normal limits  HIV ANTIBODY (ROUTINE TESTING)  I-STAT CG4 LACTIC ACID, ED    EKG  EKG Interpretation None  Radiology Mr Foot Right Wo Contrast  Result Date: 06/14/2017 CLINICAL DATA:  Stumped second toe a week ago and now the tissue is white at the tip of the toe. Top of toe is red. EXAM: MRI OF THE RIGHT FOREFOOT WITHOUT CONTRAST TECHNIQUE: Multiplanar, multisequence MR imaging of the right second toe was performed. No intravenous contrast was administered. COMPARISON:  Radiographs 06/13/2017 FINDINGS: Bones/Joint/Cartilage Examination is limited by motion artifact. Marrow signal intensities in the right second toe appear homogeneous and normal. No definite evidence of osteomyelitis. Joint spaces are preserved. Ligaments Not well visualized.  Appear grossly intact. Muscles and Tendons Limited visualization. Extensor and flexor tendons appear grossly intact. Soft tissues Mild soft tissue edema demonstrated along the dorsum of the foot. IMPRESSION: Technically limited study due to motion artifact. No definitive findings to suggest osteomyelitis. Electronically Signed   By: Burman NievesWilliam  Stevens M.D.   On: 06/14/2017 05:43   Dg Toe 2nd Right  Result Date: 06/13/2017 CLINICAL DATA:  Stubbed toe on a rock, pain and swelling EXAM: RIGHT SECOND TOE COMPARISON:  None. FINDINGS: Limited evaluation of the distal digit due to flexed positioning of the toe. No obvious fracture. Soft tissue injury distal portion of the digit. No obvious bone destruction. No  radiopaque foreign body. IMPRESSION: Limited by positioning.  No obvious fracture or bone destruction. Electronically Signed   By: Jasmine PangKim  Fujinaga M.D.   On: 06/13/2017 23:29    Procedures Procedures (including critical care time)  Medications Ordered in ED Medications  cyclobenzaprine (FLEXERIL) tablet 10 mg (not administered)  gabapentin (NEURONTIN) capsule 300 mg (300 mg Oral Not Given 06/14/17 0255)  insulin lispro (HUMALOG) cartridge 15 Units (not administered)  Insulin Glargine (LANTUS) Solostar Pen 38 Units (not administered)  lisinopril (PRINIVIL,ZESTRIL) tablet 10 mg (10 mg Oral Given 06/14/17 0254)  acetaminophen (TYLENOL) tablet 650 mg (not administered)    Or  acetaminophen (TYLENOL) suppository 650 mg (not administered)  ondansetron (ZOFRAN) tablet 4 mg (not administered)    Or  ondansetron (ZOFRAN) injection 4 mg (not administered)  vancomycin (VANCOCIN) IVPB 1000 mg/200 mL premix (1,000 mg Intravenous New Bag/Given 06/14/17 0548)  piperacillin-tazobactam (ZOSYN) IVPB 3.375 g (3.375 g Intravenous New Bag/Given 06/14/17 0548)  morphine 4 MG/ML injection 2 mg (2 mg Intravenous Given 06/14/17 0335)  vancomycin (VANCOCIN) IVPB 1000 mg/200 mL premix (0 mg Intravenous Stopped 06/14/17 0213)  piperacillin-tazobactam (ZOSYN) IVPB 3.375 g (0 g Intravenous Stopped 06/14/17 0119)  morphine 4 MG/ML injection 2 mg (2 mg Intravenous Given 06/14/17 0045)     Initial Impression / Assessment and Plan / ED Course  I have reviewed the triage vital signs and the nursing notes.  Pertinent labs & imaging results that were available during my care of the patient were reviewed by me and considered in my medical decision making (see chart for details).     Patient will need admission to the hospital for further evaluation and care of his toe infection that is now causing lymphangitis.  Patient started on Vanco and Zosyn patient is advised of the plan and all questions were answered.  Final Clinical  Impressions(s) / ED Diagnoses   Final diagnoses:  Cellulitis of toe of right foot    ED Discharge Orders    None       Charlestine NightLawyer, Keniyah Gelinas, PA-C 06/14/17 [id number]    Shon BatonHorton, Courtney F, MD 06/14/17 [phone]

## 2017-06-14 NOTE — ED Notes (Signed)
Attempted report x1. 

## 2017-06-14 NOTE — ED Notes (Signed)
Hospital bed requested from [facility] 

## 2017-06-14 NOTE — Progress Notes (Signed)
Pharmacy Antibiotic Note  Delman KittenRobert Bonet is a 51 y.o. male admitted on 06/13/2017 with cellulitis.  Pharmacy has been consulted for Vancomycin/Zosyn dosing. WBC 13.9. Renal function ok.   Plan: Vancomycin 1000 mg IV q8h Zosyn 3.375G IV q8h to be infused over 4 hours Trend WBC, temp, renal function  F/U infectious work-up Drug levels as indicated   Height: 6' (182.9 cm) Weight: 292 lb (132.5 kg) IBW/kg (Calculated) : 77.6  Temp (24hrs), Avg:98.5 F (36.9 C), Min:98.5 F (36.9 C), Max:98.5 F (36.9 C)  Recent Labs  Lab 06/13/17 1953 06/13/17 2003  WBC 13.9*  --   CREATININE 0.65  --   LATICACIDVEN  --  1.09    Estimated Creatinine Clearance: 155.6 mL/min (by C-G formula based on SCr of 0.65 mg/dL).    Allergies  Allergen Reactions  . No Known Allergies    Abran DukeLedford, Zeth Buday 06/14/2017 1:28 AM

## 2017-06-14 NOTE — Progress Notes (Signed)
PROGRESS NOTE    [name]  ZOX:[id number] [id number] DOB: [birth year] DOA: 06/13/2017 PCP: Lizbeth BarkHairston, Mandesia R, FNP    Brief Narrative:  51 year-old male who presents with right foot, second toe erythema and edema. Patient is known to have type 2 diabetes mellitus, and hypertension. For last 10 days he has been experiencing worsening right foot second toe edema, erythema and pain. Positive purulent drainage. His diabetes is uncontrolled. On initial physical examination his blood pressure 141/88, heart rate 110, respiratory rate is 17, temperature 98.5, oxygen saturation 100%. Moist mucous membranes, lungs clear to auscultation bilaterally, heart S1-S2 present rhythmic, the abdomen was soft nontender, protuberant, extremities with right foot second toe edema, erythema, and tenderness to palpation.   Patient was admitted to the hospital working diagnosis of right foot second toe cellulitis to rule out osteomyelitis  Assessment & Plan:   Principal Problem:   Cellulitis of right foot Active Problems:   Essential hypertension, benign   Cellulitis   Diabetes mellitus type 2 in obese (HCC)   1. Right foot cellulitis/ second toe. Will continue broad spectrum antibiotic therapy with zosyn and vancomycin IV, will continue to follow cell count, temperature curve and cultures, MRI with no frank osteomyelitis. Will add ketorolac for pain control, will avoid narcotics.    2. T2DM. Will continue insulin sliding scale for glucose cover and monitoring. Continue basal insulin with glargine, patient tolerating po well, capillary glucose 184, 317, 139.   3. HTN. Will continue blood pressure monitoring, will hold on antihypertensive medications for now.   4. Obesity. Will need out patient follow up, aggressive life style modification.   DVT prophylaxis: enoxaparin  Code Status: Full Family Communication: No family at the bedside Disposition Plan: home   Consultants:     Procedures:      Antimicrobials:       Subjective: Patient with pain on the right leg 10.10 in intensity, worse while trying to walk, radiated to the proximal leg, no associated nausea or vomiting, no fever or chills.   Objective: Vitals:   06/14/17 0921 06/14/17 1003 06/14/17 1004 06/14/17 1152  BP: 96/71 119/75 119/75 (!) 108/58  Pulse: 95 98 95 94  Resp: 20  20 18   Temp:    98.8 F (37.1 C)  TempSrc:    Oral  SpO2: 98% 96% 97% 94%  Weight:      Height:        Intake/Output Summary (Last 24 hours) at 06/14/2017 1338 Last data filed at 06/14/2017 1002 Gross per 24 hour  Intake 250 ml  Output -  Net 250 ml   Filed Weights   06/13/17 1950  Weight: 132.5 kg (292 lb)    Examination:   General: Not in pain or dyspnea Neurology: Awake and alert, non focal , deconditioned E ENT: no pallor, no icterus, oral mucosa moist Cardiovascular: No JVD. S1-S2 present, rhythmic, no gallops, rubs, or murmurs. No lower extremity edema. Pulmonary: vesicular breath sounds bilaterally, adequate air movement, no wheezing, rhonchi or rales. Gastrointestinal. Abdomen flat, no organomegaly, non tender, no rebound or guarding Skin. Right foot 2 nd toe with edema and erythema, ulcerated wound at the base.  Musculoskeletal: no joint deformities     Data Reviewed: I have personally reviewed following labs and imaging studies  CBC: Recent Labs  Lab 06/13/17 1953 06/14/17 0252  WBC 13.9* 11.5*  NEUTROABS 9.9*  --   HGB 13.1 11.9*  HCT 40.0 37.2*  MCV 82.6 83.6  PLT 288 266   Basic  Metabolic Panel: Recent Labs  Lab 06/13/17 1953 06/14/17 0252  NA 136 133*  K 3.5 3.5  CL 101 99*  CO2 24 25  GLUCOSE 128* 278*  BUN 9 11  CREATININE 0.65 0.79  CALCIUM 9.3 8.6*   GFR: Estimated Creatinine Clearance: 155.6 mL/min (by C-G formula based on SCr of 0.79 mg/dL). Liver Function Tests: Recent Labs  Lab 06/13/17 1953  AST 19  ALT 21  ALKPHOS 88  BILITOT 0.5  PROT 7.8  ALBUMIN 3.4*   No  results for input(s): LIPASE, AMYLASE in the last 168 hours. No results for input(s): AMMONIA in the last 168 hours. Coagulation Profile: No results for input(s): INR, PROTIME in the last 168 hours. Cardiac Enzymes: No results for input(s): CKTOTAL, CKMB, CKMBINDEX, TROPONINI in the last 168 hours. BNP (last 3 results) No results for input(s): PROBNP in the last 8760 hours. HbA1C: No results for input(s): HGBA1C in the last 72 hours. CBG: Recent Labs  Lab 06/14/17 0956 06/14/17 1233  GLUCAP 184* 317*   Lipid Profile: No results for input(s): CHOL, HDL, LDLCALC, TRIG, CHOLHDL, LDLDIRECT in the last 72 hours. Thyroid Function Tests: No results for input(s): TSH, T4TOTAL, FREET4, T3FREE, THYROIDAB in the last 72 hours. Anemia Panel: No results for input(s): VITAMINB12, FOLATE, FERRITIN, TIBC, IRON, RETICCTPCT in the last 72 hours.    Radiology Studies: I have reviewed all of the imaging during this hospital visit personally     Scheduled Meds: . gabapentin  300 mg Oral QHS  . [START ON 06/15/2017] Influenza vac split quadrivalent PF  0.5 mL Intramuscular Tomorrow-1000  . insulin aspart  0-9 Units Subcutaneous TID WC  . insulin aspart  15 Units Subcutaneous TID WC  . insulin glargine  38 Units Subcutaneous Q2200  . lisinopril  10 mg Oral QHS  . [START ON 06/15/2017] pneumococcal 23 valent vaccine  0.5 mL Intramuscular Tomorrow-1000   Continuous Infusions: . piperacillin-tazobactam (ZOSYN)  IV 3.375 g (06/14/17 1327)  . vancomycin 1,000 mg (06/14/17 1327)     LOS: 0 days        Mauricio Annett Gulaaniel Arrien, MD [facility] Pager [phone]

## 2017-06-14 NOTE — Progress Notes (Signed)
Received report on pt.

## 2017-06-14 NOTE — ED Notes (Signed)
CBG 184 Breakfast at bedside

## 2017-06-15 DIAGNOSIS — L03031 Cellulitis of right toe: Secondary | ICD-10-CM

## 2017-06-15 DIAGNOSIS — L03115 Cellulitis of right lower limb: Secondary | ICD-10-CM

## 2017-06-15 DIAGNOSIS — M86171 Other acute osteomyelitis, right ankle and foot: Secondary | ICD-10-CM

## 2017-06-15 LAB — CBC WITH DIFFERENTIAL/PLATELET
BASOS ABS: 0 10*3/uL (ref 0.0–0.1)
BASOS PCT: 0 %
EOS PCT: 3 %
Eosinophils Absolute: 0.3 10*3/uL (ref 0.0–0.7)
HCT: 38.8 % — ABNORMAL LOW (ref 39.0–52.0)
Hemoglobin: 12.4 g/dL — ABNORMAL LOW (ref 13.0–17.0)
Lymphocytes Relative: 24 %
Lymphs Abs: 1.9 10*3/uL (ref 0.7–4.0)
MCH: 27.1 pg (ref 26.0–34.0)
MCHC: 32 g/dL (ref 30.0–36.0)
MCV: 84.7 fL (ref 78.0–100.0)
MONO ABS: 0.7 10*3/uL (ref 0.1–1.0)
Monocytes Relative: 8 %
Neutro Abs: 5.4 10*3/uL (ref 1.7–7.7)
Neutrophils Relative %: 65 %
Platelets: 252 10*3/uL (ref 150–400)
RBC: 4.58 MIL/uL (ref 4.22–5.81)
RDW: 14.8 % (ref 11.5–15.5)
WBC: 8.3 10*3/uL (ref 4.0–10.5)

## 2017-06-15 LAB — GLUCOSE, CAPILLARY
GLUCOSE-CAPILLARY: 182 mg/dL — AB (ref 65–99)
GLUCOSE-CAPILLARY: 236 mg/dL — AB (ref 65–99)
GLUCOSE-CAPILLARY: 178 mg/dL — AB (ref 65–99)
GLUCOSE-CAPILLARY: 139 mg/dL — AB (ref 65–99)

## 2017-06-15 LAB — BASIC METABOLIC PANEL
ANION GAP: 7 (ref 5–15)
BUN: 10 mg/dL (ref 6–20)
CALCIUM: 8.9 mg/dL (ref 8.9–10.3)
CO2: 27 mmol/L (ref 22–32)
Chloride: 103 mmol/L (ref 101–111)
Creatinine, Ser: 0.64 mg/dL (ref 0.61–1.24)
Glucose, Bld: 176 mg/dL — ABNORMAL HIGH (ref 65–99)
POTASSIUM: 3.7 mmol/L (ref 3.5–5.1)
SODIUM: 137 mmol/L (ref 135–145)

## 2017-06-15 LAB — VANCOMYCIN, TROUGH: VANCOMYCIN TR: 10 ug/mL — AB (ref 15–20)

## 2017-06-15 MED ORDER — POVIDONE-IODINE 10 % EX SWAB
2.0000 "application " | Freq: Once | CUTANEOUS | Status: AC
  Administered 2017-06-16: 2 via TOPICAL

## 2017-06-15 MED ORDER — CHLORHEXIDINE GLUCONATE 4 % EX LIQD
60.0000 mL | Freq: Once | CUTANEOUS | Status: AC
  Administered 2017-06-16: 4 via TOPICAL
  Filled 2017-06-15: qty 60

## 2017-06-15 NOTE — Progress Notes (Signed)
PROGRESS NOTE    Delman KittenRobert Buras  ZOX:[id number] DOB: 10/30/1965 DOA: 06/13/2017 PCP: Lizbeth BarkHairston, Mandesia R, FNP    Brief Narrative:  51 year-old male who presents with right foot, second toe erythema and edema. Patient is known to have type 2 diabetes mellitus, and hypertension. For last 10 days he has been experiencing worsening right foot second toe edema, erythema and pain. Positive purulent drainage. His diabetes is uncontrolled. On initial physical examination his blood pressure 141/88, heart rate 110, respiratory rate is 17, temperature 98.5, oxygen saturation 100%. Moist mucous membranes, lungs clear to auscultation bilaterally, heart S1-S2 present rhythmic, the abdomen was soft nontender, protuberant, extremities with right foot second toe edema, erythema, and tenderness to palpation.   Patient was admitted to the hospital working diagnosis of right foot second toe cellulitis to rule out osteomyelitis   Assessment & Plan:   Principal Problem:   Cellulitis of right foot Active Problems:   Essential hypertension, benign   Cellulitis   Diabetes mellitus type 2 in obese (HCC)   1. Right foot cellulitis/ second toe. Right foot 2nd toe with less erythema and edema, will continue IV antibiotic therapy. Concerning necrotic tissue and purulent drainage from ulcerated wound at the tip of the toe. Consulted orthopedic surgery for evaluation. Patient has remained afebrile and improved leukocytosis.  2. T2DM. On insulin sliding scale for glucose cover and monitoring. Tolerating basal insulin with glargine, capillary glucose 139, 199, 182, 2236, 178.   3. HTN. Blood pressure monitoring, systolic blood pressure 113, at home on no antihypertensive medications for now.   4. Obesity. Will need aggressive life style modification and outpatient follow up.    DVT prophylaxis: enoxaparin  Code Status: Full Family Communication: No family at the bedside Disposition Plan:  home   Consultants:     Procedures:     Antimicrobials:      Subjective: Right foot pain has improved, as well as edema. Still with difficulty ambulating due to pain, that is moderate in intensity, no chest pain, no dyspnea, no nausea or vomiting.   Objective: Vitals:   06/14/17 1004 06/14/17 1152 06/14/17 2143 06/15/17 0553  BP: 119/75 (!) 108/58 (!) 115/50 119/64  Pulse: 95 94 89 90  Resp: 20 18 18 19   Temp:  98.8 F (37.1 C) 98.8 F (37.1 C) 98.2 F (36.8 C)  TempSrc:  Oral Oral Oral  SpO2: 97% 94% 97% 99%  Weight:      Height:        Intake/Output Summary (Last 24 hours) at 06/15/2017 1350 Last data filed at 06/15/2017 0537 Gross per 24 hour  Intake 990 ml  Output -  Net 990 ml   Filed Weights   06/13/17 1950  Weight: 132.5 kg (292 lb)    Examination:   General: not in dyspnea.  Neurology: Awake and alert, non focal  E ENT: mild pallor, no icterus, oral mucosa moist Cardiovascular: No JVD. S1-S2 present, rhythmic, no gallops, rubs, or murmurs. No lower extremity edema. Pulmonary: vesicular breath sounds bilaterally, adequate air movement, no wheezing, rhonchi or rales. Gastrointestinal. Abdomen flat, no organomegaly, non tender, no rebound or guarding Skin. Right foot 2nd toe with erythema, ulcerated lesion at the tip of the 2nd toe, with purulent drainage and necrotic tissue.  Musculoskeletal: no joint deformities     Data Reviewed: I have personally reviewed following labs and imaging studies  CBC: Recent Labs  Lab 06/13/17 1953 06/14/17 0252 06/15/17 0519  WBC 13.9* 11.5* 8.3  NEUTROABS 9.9*  --  5.4  HGB 13.1 11.9* 12.4*  HCT 40.0 37.2* 38.8*  MCV 82.6 83.6 84.7  PLT 288 266 252   Basic Metabolic Panel: Recent Labs  Lab 06/13/17 1953 06/14/17 0252 06/15/17 0519  NA 136 133* 137  K 3.5 3.5 3.7  CL 101 99* 103  CO2 24 25 27   GLUCOSE 128* 278* 176*  BUN 9 11 10   CREATININE 0.65 0.79 0.64  CALCIUM 9.3 8.6* 8.9    GFR: Estimated Creatinine Clearance: 155.6 mL/min (by C-G formula based on SCr of 0.64 mg/dL). Liver Function Tests: Recent Labs  Lab 06/13/17 1953  AST 19  ALT 21  ALKPHOS 88  BILITOT 0.5  PROT 7.8  ALBUMIN 3.4*   No results for input(s): LIPASE, AMYLASE in the last 168 hours. No results for input(s): AMMONIA in the last 168 hours. Coagulation Profile: No results for input(s): INR, PROTIME in the last 168 hours. Cardiac Enzymes: No results for input(s): CKTOTAL, CKMB, CKMBINDEX, TROPONINI in the last 168 hours. BNP (last 3 results) No results for input(s): PROBNP in the last 8760 hours. HbA1C: No results for input(s): HGBA1C in the last 72 hours. CBG: Recent Labs  Lab 06/14/17 1233 06/14/17 1736 06/14/17 2141 06/15/17 0750 06/15/17 1129  GLUCAP 317* 139* 199* 182* 236*   Lipid Profile: No results for input(s): CHOL, HDL, LDLCALC, TRIG, CHOLHDL, LDLDIRECT in the last 72 hours. Thyroid Function Tests: No results for input(s): TSH, T4TOTAL, FREET4, T3FREE, THYROIDAB in the last 72 hours. Anemia Panel: No results for input(s): VITAMINB12, FOLATE, FERRITIN, TIBC, IRON, RETICCTPCT in the last 72 hours.    Radiology Studies: I have reviewed all of the imaging during this hospital visit personally     Scheduled Meds: . gabapentin  300 mg Oral QHS  . insulin aspart  0-9 Units Subcutaneous TID WC  . insulin aspart  15 Units Subcutaneous TID WC  . insulin glargine  38 Units Subcutaneous Q2200   Continuous Infusions: . piperacillin-tazobactam (ZOSYN)  IV Stopped (06/15/17 0949)  . vancomycin Stopped (06/15/17 [id number])     LOS: 1 day        Joletta Manner Annett Gulaaniel Kristoff Coonradt, MD [facility] Pager [phone]

## 2017-06-15 NOTE — Consult Note (Signed)
ORTHOPAEDIC CONSULTATION  REQUESTING PHYSICIAN: Arrien, York RamMauricio Ayala,*  Chief Complaint: Cellulitis with streaking up the right leg from the second toe with ulceration and exposed bone.  HPI: [name] is a 51 y.o. male who presents with osteomyelitis right foot second toe.  Patient states that he has not been very compliant with his diabetes.  He states he has not taken medicines recently has not eaten a proper diet.  He states he recently stubbed his toe and feels like his foot symptoms are all secondary to stubbing his toe.  Patient is up walking the halls with his IV antibiotics wearing hospital socks with no symptoms.  Past Medical History:  Diagnosis Date  . Diabetes mellitus without complication (HCC)   . Fracture closed, humerus    right  . Fracture of humeral shaft, right, closed 09/02/2016  . Fracture of humerus, proximal, right, closed 09/03/2016  . Hypertension    Past Surgical History:  Procedure Laterality Date  . CHOLECYSTECTOMY    . WISDOM TOOTH EXTRACTION     Social History   Socioeconomic History  . Marital status: Single    Spouse name: None  . Number of children: None  . Years of education: None  . Highest education level: None  Social Needs  . Financial resource strain: None  . Food insecurity - worry: None  . Food insecurity - inability: None  . Transportation needs - medical: None  . Transportation needs - non-medical: None  Occupational History  . None  Tobacco Use  . Smoking status: Former Smoker    Last attempt to quit: 08/09/2011    Years since quitting: 5.8  . Smokeless tobacco: Never Used  Substance and Sexual Activity  . Alcohol use: Yes    Comment: 3 drinks per week  . Drug use: No  . Sexual activity: None  Other Topics Concern  . None  Social History Narrative  . None   Family History  Problem Relation Age of Onset  . Sjogren's syndrome Mother   . CAD Father   . Diabetes Father    - negative except otherwise stated  in the family history section Allergies  Allergen Reactions  . No Known Allergies    Prior to Admission medications   Medication Sig Start Date End Date Taking? Authorizing Provider  cyclobenzaprine (FLEXERIL) 10 MG tablet Take 1 tablet (10 mg total) by mouth 2 (two) times daily as needed for muscle spasms. 05/30/17  Yes Defelice, Para MarchJeanette, NP  gabapentin (NEURONTIN) 300 MG capsule Take 1 capsule (300 mg total) by mouth 2 (two) times daily. Patient taking differently: Take 300 mg at bedtime by mouth.  11/09/16  Yes [name], Oren BeckmannMandesia R, FNP  Insulin Glargine (LANTUS SOLOSTAR) 100 UNIT/ML Solostar Pen Inject 40 Units into the skin daily at 10 pm. Patient taking differently: Inject 38 Units daily at 10 pm into the skin.  03/01/17  Yes [name], Mandesia R, FNP  insulin lispro (HUMALOG KWIKPEN) 100 UNIT/ML KiwkPen Inject 0-0.15 mLs (0-15 Units total) into the skin 3 (three) times daily with meals. 10/24/16  Yes Quentin AngstJegede, Olugbemiga E, MD  lisinopril (PRINIVIL,ZESTRIL) 10 MG tablet Take 1 tablet (10 mg total) by mouth daily. Patient taking differently: Take 10 mg at bedtime by mouth.  12/01/16  Yes [name], Oren BeckmannMandesia R, FNP  metFORMIN (GLUCOPHAGE XR) 500 MG 24 hr tablet Take 2 tablets (1,000 mg total) by mouth 2 (two) times daily with a meal. Patient taking differently: Take 500 mg 2 (two) times daily with  a meal by mouth.  10/06/16  Yes Quentin Angst, MD  diclofenac (VOLTAREN) 75 MG EC tablet Take 1 tablet (75 mg total) by mouth 2 (two) times daily. Patient not taking: Reported on 06/13/2017 12/26/16   Lizbeth Bark, FNP  insulin aspart (NOVOLOG FLEXPEN) 100 UNIT/ML FlexPen 0-15 Units, Subcutaneous, 3 times daily with meals CBG < 70: implement hypoglycemia protocol-call MD CBG 70 - 120: 0 units CBG 121 - 150: 2 units CBG 151 - 200: 3 units CBG 201 - 250: 5 units CBG 251 - 300: 8 units CBG 301 - 350: 11 units CBG 351 - 400: 15 units CBG > 400: Patient not taking: Reported on 06/14/2017  10/10/16   Quentin Angst, MD  TRUEPLUS LANCETS 28G MISC Use as directed Patient not taking: Reported on 06/13/2017 11/03/16   Quentin Angst, MD   Mr Foot Right Wo Contrast  Result Date: 06/14/2017 CLINICAL DATA:  Stumped second toe a week ago and now the tissue is white at the tip of the toe. Top of toe is red. EXAM: MRI OF THE RIGHT FOREFOOT WITHOUT CONTRAST TECHNIQUE: Multiplanar, multisequence MR imaging of the right second toe was performed. No intravenous contrast was administered. COMPARISON:  Radiographs 06/13/2017 FINDINGS: Bones/Joint/Cartilage Examination is limited by motion artifact. Marrow signal intensities in the right second toe appear homogeneous and normal. No definite evidence of osteomyelitis. Joint spaces are preserved. Ligaments Not well visualized.  Appear grossly intact. Muscles and Tendons Limited visualization. Extensor and flexor tendons appear grossly intact. Soft tissues Mild soft tissue edema demonstrated along the dorsum of the foot. IMPRESSION: Technically limited study due to motion artifact. No definitive findings to suggest osteomyelitis. Electronically Signed   By: Burman Nieves M.D.   On: 06/14/2017 05:43   Dg Toe 2nd Right  Result Date: 06/13/2017 CLINICAL DATA:  Stubbed toe on a rock, pain and swelling EXAM: RIGHT SECOND TOE COMPARISON:  None. FINDINGS: Limited evaluation of the distal digit due to flexed positioning of the toe. No obvious fracture. Soft tissue injury distal portion of the digit. No obvious bone destruction. No radiopaque foreign body. IMPRESSION: Limited by positioning.  No obvious fracture or bone destruction. Electronically Signed   By: Jasmine Pang M.D.   On: 06/13/2017 23:29   - pertinent xrays, CT, MRI studies were reviewed and independently interpreted  Positive ROS: All other systems have been reviewed and were otherwise negative with the exception of those mentioned in the HPI and as above.  Physical Exam: General: Alert,  no acute distress Psychiatric: Patient is competent for consent with normal mood and affect Lymphatic: No axillary or cervical lymphadenopathy Cardiovascular: No pedal edema Respiratory: Patient has forced expiratory breathing. GI: No organomegaly, abdomen is soft and non-tender  Skin: Examination patient has a Waggoner grade 3 ulcer of the right foot second toe there is exposed bone in the wound.  There is a necrotic wound over the tip of the toe with sausage digit swelling and cellulitis of the toe with recent red streaking up his leg.   Neurologic: Patient does not have protective sensation bilateral lower extremities.   MUSCULOSKELETAL:  Patient has good hair growth down to the great toe.  He has a faintly palpable dorsalis pedis pulse.  He does have a palpable femoral pulse.  There is cellulitis up to the MTP joint.  Radiographs show no destructive bony changes the MRI scan shows no bone edema.  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  Plan: Plan: Discussed with the patient his only option would be to proceed with amputation of the second toe at the MTP joint.  Discussed that long-term antibiotics would not be able to cure the infection and that he would be at increased risk of the infection spreading and potential need for a transtibial amputation.  I had a long discussion discussed the importance of proper glucose control for the incision to heal discussed that he would need to be off his foot for 3 weeks he does work as a Investment banker, operationalchef and patient would be unable to work for most likely a month.  Discussed that without aggressive treatment to remove the infected toe he has an increased risk of more extensive infection.  I recommend the patient be n.p.o. after midnight if he agrees to proceed with surgery we would set him up for  surgery tomorrow morning.  Patient states that this time he cannot consider surgical intervention.  Thank you for the consult and the opportunity to see Mr. Lin LandsmanWillis  Chaim Gatley, MD [facility] [phone] 7:41 PM

## 2017-06-15 NOTE — Consult Note (Signed)
Reason for Consult:Toe ulcer Referring Physician: [name] is an 51 y.o. male with DM, HTN, and obesity. HPI: [name] stubbed his toe on a rock about 10d ago. He cleaned it well and it seemed like it started to heal but then quickly began to get worse. He was wiping it with alcohol and using Neosporin and a dressing over it. Once the pain became too great and started moving up his foot and leg he sought care in the ED. He denies any constitutional symptoms. He denies prior history of foot ulcers.   Past Medical History:  Diagnosis Date  . Diabetes mellitus without complication ([facility])   . Fracture closed, humerus    right  . Fracture of humeral shaft, right, closed 09/02/2016  . Fracture of humerus, proximal, right, closed 09/03/2016  . Hypertension     Past Surgical History:  Procedure Laterality Date  . CHOLECYSTECTOMY    . WISDOM TOOTH EXTRACTION      Family History  Problem Relation Age of Onset  . Sjogren's syndrome Mother   . CAD Father   . Diabetes Father     Social History:  reports that he quit smoking about 5 years ago. he has never used smokeless tobacco. He reports that he drinks alcohol. He reports that he does not use drugs.  Allergies:  Allergies  Allergen Reactions  . No Known Allergies     Medications: I have reviewed the patient's current medications.  Results for orders placed or performed during the hospital encounter of 06/13/17 (from the past 48 hour(s))  Comprehensive metabolic panel     Status: Abnormal   Collection Time: 06/13/17  7:53 PM  Result Value Ref Range   Sodium 136 135 - 145 mmol/L   Potassium 3.5 3.5 - 5.1 mmol/L   Chloride 101 101 - 111 mmol/L   CO2 24 22 - 32 mmol/L   Glucose, Bld 128 (H) 65 - 99 mg/dL   BUN 9 6 - 20 mg/dL   Creatinine, Ser 0.65 0.61 - 1.24 mg/dL   Calcium 9.3 8.9 - 10.3 mg/dL   Total Protein 7.8 6.5 - 8.1 g/dL   Albumin 3.4 (L) 3.5 - 5.0 g/dL   AST 19 15 - 41 U/L   ALT 21 17 - 63 U/L   Alkaline  Phosphatase 88 38 - 126 U/L   Total Bilirubin 0.5 0.3 - 1.2 mg/dL   GFR calc non Af Amer >60 >60 mL/min   GFR calc Af Amer >60 >60 mL/min    Comment: (NOTE) The eGFR has been calculated using the CKD EPI equation. This calculation has not been validated in all clinical situations. eGFR's persistently <60 mL/min signify possible Chronic Kidney Disease.    Anion gap 11 5 - 15  CBC with Differential     Status: Abnormal   Collection Time: 06/13/17  7:53 PM  Result Value Ref Range   WBC 13.9 (H) 4.0 - 10.5 K/uL   RBC 4.84 4.22 - 5.81 MIL/uL   Hemoglobin 13.1 13.0 - 17.0 g/dL   HCT 40.0 39.0 - 52.0 %   MCV 82.6 78.0 - 100.0 fL   MCH 27.1 26.0 - 34.0 pg   MCHC 32.8 30.0 - 36.0 g/dL   RDW 14.1 11.5 - 15.5 %   Platelets 288 150 - 400 K/uL   Neutrophils Relative % 72 %   Neutro Abs 9.9 (H) 1.7 - 7.7 K/uL   Lymphocytes Relative 21 %   Lymphs Abs 2.9 0.7 -  4.0 K/uL   Monocytes Relative 6 %   Monocytes Absolute 0.9 0.1 - 1.0 K/uL   Eosinophils Relative 1 %   Eosinophils Absolute 0.2 0.0 - 0.7 K/uL   Basophils Relative 0 %   Basophils Absolute 0.0 0.0 - 0.1 K/uL  I-Stat CG4 Lactic Acid, ED     Status: None   Collection Time: 06/13/17  8:03 PM  Result Value Ref Range   Lactic Acid, Venous 1.09 0.5 - 1.9 mmol/L  HIV antibody (Routine Testing)     Status: None   Collection Time: 06/14/17  2:52 AM  Result Value Ref Range   HIV Screen 4th Generation wRfx Non Reactive Non Reactive    Comment: (NOTE) Performed At: [facility] [place], [place] [id number] Rush Farmer MD [id number]   Basic metabolic panel     Status: Abnormal   Collection Time: 06/14/17  2:52 AM  Result Value Ref Range   Sodium 133 (L) 135 - 145 mmol/L   Potassium 3.5 3.5 - 5.1 mmol/L   Chloride 99 (L) 101 - 111 mmol/L   CO2 25 22 - 32 mmol/L   Glucose, Bld 278 (H) 65 - 99 mg/dL   BUN 11 6 - 20 mg/dL   Creatinine, Ser 0.79 0.61 - 1.24 mg/dL   Calcium 8.6 (L) 8.9 - 10.3 mg/dL   GFR calc  non Af Amer >60 >60 mL/min   GFR calc Af Amer >60 >60 mL/min    Comment: (NOTE) The eGFR has been calculated using the CKD EPI equation. This calculation has not been validated in all clinical situations. eGFR's persistently <60 mL/min signify possible Chronic Kidney Disease.    Anion gap 9 5 - 15  CBC     Status: Abnormal   Collection Time: 06/14/17  2:52 AM  Result Value Ref Range   WBC 11.5 (H) 4.0 - 10.5 K/uL   RBC 4.45 4.22 - 5.81 MIL/uL   Hemoglobin 11.9 (L) 13.0 - 17.0 g/dL   HCT 37.2 (L) 39.0 - 52.0 %   MCV 83.6 78.0 - 100.0 fL   MCH 26.7 26.0 - 34.0 pg   MCHC 32.0 30.0 - 36.0 g/dL   RDW 14.7 11.5 - 15.5 %   Platelets 266 150 - 400 K/uL  CBG monitoring, ED     Status: Abnormal   Collection Time: 06/14/17  9:56 AM  Result Value Ref Range   Glucose-Capillary 184 (H) 65 - 99 mg/dL  Glucose, capillary     Status: Abnormal   Collection Time: 06/14/17 12:33 PM  Result Value Ref Range   Glucose-Capillary 317 (H) 65 - 99 mg/dL  Glucose, capillary     Status: Abnormal   Collection Time: 06/14/17  5:36 PM  Result Value Ref Range   Glucose-Capillary 139 (H) 65 - 99 mg/dL  Glucose, capillary     Status: Abnormal   Collection Time: 06/14/17  9:41 PM  Result Value Ref Range   Glucose-Capillary 199 (H) 65 - 99 mg/dL  CBC with Differential/Platelet     Status: Abnormal   Collection Time: 06/15/17  5:19 AM  Result Value Ref Range   WBC 8.3 4.0 - 10.5 K/uL   RBC 4.58 4.22 - 5.81 MIL/uL   Hemoglobin 12.4 (L) 13.0 - 17.0 g/dL   HCT 38.8 (L) 39.0 - 52.0 %   MCV 84.7 78.0 - 100.0 fL   MCH 27.1 26.0 - 34.0 pg   MCHC 32.0 30.0 - 36.0 g/dL   RDW 14.8 11.5 -  15.5 %   Platelets 252 150 - 400 K/uL   Neutrophils Relative % 65 %   Neutro Abs 5.4 1.7 - 7.7 K/uL   Lymphocytes Relative 24 %   Lymphs Abs 1.9 0.7 - 4.0 K/uL   Monocytes Relative 8 %   Monocytes Absolute 0.7 0.1 - 1.0 K/uL   Eosinophils Relative 3 %   Eosinophils Absolute 0.3 0.0 - 0.7 K/uL   Basophils Relative 0 %    Basophils Absolute 0.0 0.0 - 0.1 K/uL  Basic metabolic panel     Status: Abnormal   Collection Time: 06/15/17  5:19 AM  Result Value Ref Range   Sodium 137 135 - 145 mmol/L   Potassium 3.7 3.5 - 5.1 mmol/L   Chloride 103 101 - 111 mmol/L   CO2 27 22 - 32 mmol/L   Glucose, Bld 176 (H) 65 - 99 mg/dL   BUN 10 6 - 20 mg/dL   Creatinine, Ser 0.64 0.61 - 1.24 mg/dL   Calcium 8.9 8.9 - 10.3 mg/dL   GFR calc non Af Amer >60 >60 mL/min   GFR calc Af Amer >60 >60 mL/min    Comment: (NOTE) The eGFR has been calculated using the CKD EPI equation. This calculation has not been validated in all clinical situations. eGFR's persistently <60 mL/min signify possible Chronic Kidney Disease.    Anion gap 7 5 - 15  Glucose, capillary     Status: Abnormal   Collection Time: 06/15/17  7:50 AM  Result Value Ref Range   Glucose-Capillary 182 (H) 65 - 99 mg/dL  Glucose, capillary     Status: Abnormal   Collection Time: 06/15/17 11:29 AM  Result Value Ref Range   Glucose-Capillary 236 (H) 65 - 99 mg/dL    Mr Foot Right Wo Contrast  Result Date: 06/14/2017 CLINICAL DATA:  Stumped second toe a week ago and now the tissue is white at the tip of the toe. Top of toe is red. EXAM: MRI OF THE RIGHT FOREFOOT WITHOUT CONTRAST TECHNIQUE: Multiplanar, multisequence MR imaging of the right second toe was performed. No intravenous contrast was administered. COMPARISON:  Radiographs 06/13/2017 FINDINGS: Bones/Joint/Cartilage Examination is limited by motion artifact. Marrow signal intensities in the right second toe appear homogeneous and normal. No definite evidence of osteomyelitis. Joint spaces are preserved. Ligaments Not well visualized.  Appear grossly intact. Muscles and Tendons Limited visualization. Extensor and flexor tendons appear grossly intact. Soft tissues Mild soft tissue edema demonstrated along the dorsum of the foot. IMPRESSION: Technically limited study due to motion artifact. No definitive findings to  suggest osteomyelitis. Electronically Signed   By: Lucienne Capers M.D.   On: 06/14/2017 05:43   Dg Toe 2nd Right  Result Date: 06/13/2017 CLINICAL DATA:  Stubbed toe on a rock, pain and swelling EXAM: RIGHT SECOND TOE COMPARISON:  None. FINDINGS: Limited evaluation of the distal digit due to flexed positioning of the toe. No obvious fracture. Soft tissue injury distal portion of the digit. No obvious bone destruction. No radiopaque foreign body. IMPRESSION: Limited by positioning.  No obvious fracture or bone destruction. Electronically Signed   By: Donavan Foil M.D.   On: 06/13/2017 23:29    Review of Systems  Constitutional: Negative for weight loss.  HENT: Negative for ear discharge, ear pain, hearing loss and tinnitus.   Eyes: Negative for blurred vision, double vision, photophobia and pain.  Respiratory: Negative for cough, sputum production and shortness of breath.   Cardiovascular: Negative for chest pain.  Gastrointestinal:  Negative for abdominal pain, nausea and vomiting.  Genitourinary: Negative for dysuria, flank pain, frequency and urgency.  Musculoskeletal: Positive for joint pain (Right second toe, foot, medial shin). Negative for back pain, falls, myalgias and neck pain.  Neurological: Negative for dizziness, tingling, sensory change, focal weakness, loss of consciousness and headaches.  Endo/Heme/Allergies: Does not bruise/bleed easily.  Psychiatric/Behavioral: Negative for depression, memory loss and substance abuse. The patient is not nervous/anxious.    Blood pressure 113/64, pulse 94, temperature 98.6 F (37 C), temperature source Oral, resp. rate 19, height 6' (1.829 m), weight 132.5 kg (292 lb), SpO2 93 %. Physical Exam  Constitutional: He appears well-developed and well-nourished. No distress.  HENT:  Head: Normocephalic.  Eyes: Conjunctivae are normal. Right eye exhibits no discharge. Left eye exhibits no discharge. No scleral icterus.  Neck: Normal range of  motion.  Cardiovascular: Normal rate and regular rhythm.  Respiratory: Effort normal. No respiratory distress.  Musculoskeletal:  RLE No traumatic wounds, ecchymosis, or rash  2nd toe/midfoot TTP  No knee or ankle effusion  Knee stable to varus/ valgus and anterior/posterior stress  Sens DPN, SPN, TN intact (though he says he has some mild neuropathy)  Motor EHL, ext, flex, evers 5/5  DP 2+, PT 1+, No significant edema   Neurological: He is alert.  Skin: Skin is warm and dry. He is not diaphoretic.  Psychiatric: He has a normal mood and affect. His behavior is normal.    Assessment/Plan: Right second toe wound/dacrylitis -- Dr. Sharol Given to assess either this afternoon or in AM. Check ABI's. Will make NPO after MN in case debridement/amputation indicated.    Lisette Abu, PA-C Orthopedic Surgery [phone] 06/15/2017, 2:17 PM

## 2017-06-15 NOTE — Progress Notes (Signed)
Pharmacy Antibiotic Note  [name] is a 51 y.o. male admitted on 06/13/2017 with RLE cellulitis.  Pharmacy has been consulted for Vancomycin and Zosyn dosing. MRI neg for osteo. Ortho considering debridement vs amputation. Vancomycin trough is 10 but was drawn ~1 hr late, true trough closer to 10.9 and therapeutic (goal 10-15 mcg/ml). Renal function is stable.  Plan: Continue vancomycin 1000 mg IV q8h Continue Zosyn 3.375G IV q8h to be infused over 4 hours Trend WBC, temp, renal function  F/U infectious work-up Drug levels as indicated F/U Ortho plan   Height: 6' (182.9 cm) Weight: 292 lb (132.5 kg) IBW/kg (Calculated) : 77.6  Temp (24hrs), Avg:98.5 F (36.9 C), Min:98.2 F (36.8 C), Max:98.8 F (37.1 C)  Recent Labs  Lab 06/13/17 1953 06/13/17 2003 06/14/17 0252 06/15/17 0519 06/15/17 1451  WBC 13.9*  --  11.5* 8.3  --   CREATININE 0.65  --  0.79 0.64  --   LATICACIDVEN  --  1.09  --   --   --   VANCOTROUGH  --   --   --   --  10*    Estimated Creatinine Clearance: 155.6 mL/min (by C-G formula based on SCr of 0.64 mg/dL).    Allergies  Allergen Reactions  . No Known Allergies     Loura BackJennifer Ullin, PharmD, BCPS Clinical Pharmacist Phone for today [phone]- [phone] Main pharmacy - [phone] 06/15/2017 4:37 PM

## 2017-06-15 NOTE — Consult Note (Signed)
WOC Nurse wound consult note Reason for Consult: toe wound Patient with history DM and peripheral neuropathy, stubbed toe  Wound type: trauma, with neuropathy  Pressure Injury POA: NA Measurement: 1.5cm x 1.5cm x 0.1cm  Wound bed:dry, partial thickness skin loss on the 2nd toe  Drainage (amount, consistency, odor) none at the time of my assessment, no dressing in place Periwound: erythema, no streaking, edema   Dressing procedure/placement/frequency: Single layer of xeroform over th open area of the toe. Use conform gauze to hold in place.   Discussed POC with patient and bedside nurse.  Re consult if needed, will not follow at this time. Thanks  Mandalyn Pasqua M.D.[employer] MSN, RN,CWOCN, CNS, CWON-AP [phone])

## 2017-06-15 NOTE — H&P (View-Only) (Signed)
  ORTHOPAEDIC CONSULTATION  REQUESTING PHYSICIAN: Arrien, [name],*  Chief Complaint: Cellulitis with streaking up the right leg from the second toe with ulceration and exposed bone.  HPI: [name] is a 51 y.o. male who presents with osteomyelitis right foot second toe.  Patient states that he has not been very compliant with his diabetes.  He states he has not taken medicines recently has not eaten a proper diet.  He states he recently stubbed his toe and feels like his foot symptoms are all secondary to stubbing his toe.  Patient is up walking the halls with his IV antibiotics wearing hospital socks with no symptoms.  Past Medical History:  Diagnosis Date  . Diabetes mellitus without complication ([facility])   . Fracture closed, humerus    right  . Fracture of humeral shaft, right, closed 09/02/2016  . Fracture of humerus, proximal, right, closed 09/03/2016  . Hypertension    Past Surgical History:  Procedure Laterality Date  . CHOLECYSTECTOMY    . WISDOM TOOTH EXTRACTION     Social History   Socioeconomic History  . Marital status: Single    Spouse name: None  . Number of children: None  . Years of education: None  . Highest education level: None  Social Needs  . Financial resource strain: None  . Food insecurity - worry: None  . Food insecurity - inability: None  . Transportation needs - medical: None  . Transportation needs - non-medical: None  Occupational History  . None  Tobacco Use  . Smoking status: Former Smoker    Last attempt to quit: 08/09/2011    Years since quitting: 5.8  . Smokeless tobacco: Never Used  Substance and Sexual Activity  . Alcohol use: Yes    Comment: 3 drinks per week  . Drug use: No  . Sexual activity: None  Other Topics Concern  . None  Social History Narrative  . None   Family History  Problem Relation Age of Onset  . Sjogren's syndrome Mother   . CAD Father   . Diabetes Father    - negative except otherwise stated  in the family history section Allergies  Allergen Reactions  . No Known Allergies    Prior to Admission medications   Medication Sig Start Date End Date Taking? Authorizing Provider  cyclobenzaprine (FLEXERIL) 10 MG tablet Take 1 tablet (10 mg total) by mouth 2 (two) times daily as needed for muscle spasms. 05/30/17  Yes Defelice, Jeanette, NP  gabapentin (NEURONTIN) 300 MG capsule Take 1 capsule (300 mg total) by mouth 2 (two) times daily. Patient taking differently: Take 300 mg at bedtime by mouth.  11/09/16  Yes [name], Mandesia R, FNP  Insulin Glargine (LANTUS SOLOSTAR) 100 UNIT/ML Solostar Pen Inject 40 Units into the skin daily at 10 pm. Patient taking differently: Inject 38 Units daily at 10 pm into the skin.  03/01/17  Yes [name], Mandesia R, FNP  insulin lispro (HUMALOG KWIKPEN) 100 UNIT/ML KiwkPen Inject 0-0.15 mLs (0-15 Units total) into the skin 3 (three) times daily with meals. 10/24/16  Yes Jegede, Olugbemiga E, MD  lisinopril (PRINIVIL,ZESTRIL) 10 MG tablet Take 1 tablet (10 mg total) by mouth daily. Patient taking differently: Take 10 mg at bedtime by mouth.  12/01/16  Yes [name], Mandesia R, FNP  metFORMIN (GLUCOPHAGE XR) 500 MG 24 hr tablet Take 2 tablets (1,000 mg total) by mouth 2 (two) times daily with a meal. Patient taking differently: Take 500 mg 2 (two) times daily with   a meal by mouth.  10/06/16  Yes Jegede, Olugbemiga E, MD  diclofenac (VOLTAREN) 75 MG EC tablet Take 1 tablet (75 mg total) by mouth 2 (two) times daily. Patient not taking: Reported on 06/13/2017 12/26/16   [name], Mandesia R, FNP  insulin aspart (NOVOLOG FLEXPEN) 100 UNIT/ML FlexPen 0-15 Units, Subcutaneous, 3 times daily with meals CBG < 70: implement hypoglycemia protocol-call MD CBG 70 - 120: 0 units CBG 121 - 150: 2 units CBG 151 - 200: 3 units CBG 201 - 250: 5 units CBG 251 - 300: 8 units CBG 301 - 350: 11 units CBG 351 - 400: 15 units CBG > 400: Patient not taking: Reported on 06/14/2017  10/10/16   Jegede, Olugbemiga E, MD  TRUEPLUS LANCETS 28G MISC Use as directed Patient not taking: Reported on 06/13/2017 11/03/16   Jegede, Olugbemiga E, MD   Mr Foot Right Wo Contrast  Result Date: 06/14/2017 CLINICAL DATA:  Stumped second toe a week ago and now the tissue is white at the tip of the toe. Top of toe is red. EXAM: MRI OF THE RIGHT FOREFOOT WITHOUT CONTRAST TECHNIQUE: Multiplanar, multisequence MR imaging of the right second toe was performed. No intravenous contrast was administered. COMPARISON:  Radiographs 06/13/2017 FINDINGS: Bones/Joint/Cartilage Examination is limited by motion artifact. Marrow signal intensities in the right second toe appear homogeneous and normal. No definite evidence of osteomyelitis. Joint spaces are preserved. Ligaments Not well visualized.  Appear grossly intact. Muscles and Tendons Limited visualization. Extensor and flexor tendons appear grossly intact. Soft tissues Mild soft tissue edema demonstrated along the dorsum of the foot. IMPRESSION: Technically limited study due to motion artifact. No definitive findings to suggest osteomyelitis. Electronically Signed   By: William  Stevens M.D.   On: 06/14/2017 05:43   Dg Toe 2nd Right  Result Date: 06/13/2017 CLINICAL DATA:  Stubbed toe on a rock, pain and swelling EXAM: RIGHT SECOND TOE COMPARISON:  None. FINDINGS: Limited evaluation of the distal digit due to flexed positioning of the toe. No obvious fracture. Soft tissue injury distal portion of the digit. No obvious bone destruction. No radiopaque foreign body. IMPRESSION: Limited by positioning.  No obvious fracture or bone destruction. Electronically Signed   By: Kim  Fujinaga M.D.   On: 06/13/2017 23:29   - pertinent xrays, CT, MRI studies were reviewed and independently interpreted  Positive ROS: All other systems have been reviewed and were otherwise negative with the exception of those mentioned in the HPI and as above.  Physical Exam: General: Alert,  no acute distress Psychiatric: Patient is competent for consent with normal mood and affect Lymphatic: No axillary or cervical lymphadenopathy Cardiovascular: No pedal edema Respiratory: Patient has forced expiratory breathing. GI: No organomegaly, abdomen is soft and non-tender  Skin: Examination patient has a Waggoner grade 3 ulcer of the right foot second toe there is exposed bone in the wound.  There is a necrotic wound over the tip of the toe with sausage digit swelling and cellulitis of the toe with recent red streaking up his leg.   Neurologic: Patient does not have protective sensation bilateral lower extremities.   MUSCULOSKELETAL:  Patient has good hair growth down to the great toe.  He has a faintly palpable dorsalis pedis pulse.  He does have a palpable femoral pulse.  There is cellulitis up to the MTP joint.  Radiographs show no destructive bony changes the MRI scan shows no bone edema.  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  Plan: Plan: Discussed with the patient his only option would be to proceed with amputation of the second toe at the MTP joint.  Discussed that long-term antibiotics would not be able to cure the infection and that he would be at increased risk of the infection spreading and potential need for a transtibial amputation.  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  Discussed that without aggressive treatment to remove the infected toe he has an increased risk of more extensive infection.  I recommend the patient be n.p.o. after midnight if he agrees to proceed with surgery we would set him up for  surgery tomorrow morning.  Patient states that this time he cannot consider surgical intervention.  Thank you for the consult and the opportunity to see [name]  Baldwin Racicot, MD [facility] [phone] 7:41 PM     

## 2017-06-16 ENCOUNTER — Encounter (HOSPITAL_COMMUNITY): Payer: Self-pay | Admitting: Anesthesiology

## 2017-06-16 ENCOUNTER — Other Ambulatory Visit: Payer: Self-pay | Admitting: Family Medicine

## 2017-06-16 ENCOUNTER — Inpatient Hospital Stay (HOSPITAL_COMMUNITY): Payer: Self-pay | Admitting: Anesthesiology

## 2017-06-16 ENCOUNTER — Encounter (HOSPITAL_COMMUNITY)
Admission: EM | Disposition: A | Discharge status: Home Health | Payer: Self-pay | Source: Home / Self Care | Attending: Internal Medicine

## 2017-06-16 HISTORY — PX: AMPUTATION TOE: SHX6595

## 2017-06-16 LAB — GLUCOSE, CAPILLARY
GLUCOSE-CAPILLARY: 197 mg/dL — AB (ref 65–99)
GLUCOSE-CAPILLARY: 214 mg/dL — AB (ref 65–99)
GLUCOSE-CAPILLARY: 148 mg/dL — AB (ref 65–99)
Glucose-Capillary: 200 mg/dL — ABNORMAL HIGH (ref 65–99)
Glucose-Capillary: 185 mg/dL — ABNORMAL HIGH (ref 65–99)
Glucose-Capillary: 205 mg/dL — ABNORMAL HIGH (ref 65–99)

## 2017-06-16 LAB — SURGICAL PCR SCREEN
MRSA, PCR: NEGATIVE
STAPHYLOCOCCUS AUREUS: NEGATIVE

## 2017-06-16 SURGERY — AMPUTATION, TOE
Anesthesia: General | Laterality: Right

## 2017-06-16 MED ORDER — INSULIN ASPART 100 UNIT/ML ~~LOC~~ SOLN
5.0000 [IU] | Freq: Once | SUBCUTANEOUS | Status: AC
  Administered 2017-06-16: 5 [IU] via SUBCUTANEOUS

## 2017-06-16 MED ORDER — PROPOFOL 10 MG/ML IV BOLUS
INTRAVENOUS | Status: AC
  Filled 2017-06-16: qty 20

## 2017-06-16 MED ORDER — FENTANYL CITRATE (PF) 100 MCG/2ML IJ SOLN
INTRAMUSCULAR | Status: DC | PRN
  Administered 2017-06-16 (×3): 25 ug via INTRAVENOUS
  Administered 2017-06-16: 50 ug via INTRAVENOUS

## 2017-06-16 MED ORDER — ONDANSETRON HCL 4 MG/2ML IJ SOLN: 4.0000 mg | Freq: Four times a day (QID) | INTRAMUSCULAR | Status: DC | PRN

## 2017-06-16 MED ORDER — METOCLOPRAMIDE HCL 5 MG/ML IJ SOLN: 5.0000 mg | Freq: Three times a day (TID) | INTRAMUSCULAR | Status: DC | PRN

## 2017-06-16 MED ORDER — SODIUM CHLORIDE 0.9 % IV SOLN
INTRAVENOUS | Status: DC | PRN
  Administered 2017-06-16: 09:00:00 via INTRAVENOUS

## 2017-06-16 MED ORDER — ACETAMINOPHEN 325 MG PO TABS: 650.0000 mg | ORAL_TABLET | ORAL | Status: DC | PRN

## 2017-06-16 MED ORDER — LIDOCAINE HCL (CARDIAC) 20 MG/ML IV SOLN
INTRAVENOUS | Status: DC | PRN
  Administered 2017-06-16: 60 mg via INTRAVENOUS

## 2017-06-16 MED ORDER — LIDOCAINE 2% (20 MG/ML) 5 ML SYRINGE
INTRAMUSCULAR | Status: AC
  Filled 2017-06-16: qty 5

## 2017-06-16 MED ORDER — MAGNESIUM CITRATE PO SOLN: 1.0000 | Freq: Once | ORAL | Status: DC | PRN

## 2017-06-16 MED ORDER — ONDANSETRON HCL 4 MG/2ML IJ SOLN
INTRAMUSCULAR | Status: DC | PRN
  Administered 2017-06-16: 4 mg via INTRAVENOUS

## 2017-06-16 MED ORDER — MIDAZOLAM HCL 2 MG/2ML IJ SOLN
INTRAMUSCULAR | Status: AC
  Filled 2017-06-16: qty 2

## 2017-06-16 MED ORDER — DEXAMETHASONE SODIUM PHOSPHATE 10 MG/ML IJ SOLN
INTRAMUSCULAR | Status: AC
  Filled 2017-06-16: qty 1

## 2017-06-16 MED ORDER — MIDAZOLAM HCL 5 MG/5ML IJ SOLN
INTRAMUSCULAR | Status: DC | PRN
  Administered 2017-06-16: 2 mg via INTRAVENOUS

## 2017-06-16 MED ORDER — DEXTROSE 5 % IV SOLN
500.0000 mg | Freq: Four times a day (QID) | INTRAVENOUS | Status: DC | PRN
  Administered 2017-06-17: 500 mg via INTRAVENOUS
  Filled 2017-06-16 (×2): qty 5

## 2017-06-16 MED ORDER — ACETAMINOPHEN 650 MG RE SUPP: 650.0000 mg | RECTAL | Status: DC | PRN

## 2017-06-16 MED ORDER — INSULIN ASPART 100 UNIT/ML ~~LOC~~ SOLN
SUBCUTANEOUS | Status: AC
  Filled 2017-06-16: qty 1

## 2017-06-16 MED ORDER — PROPOFOL 10 MG/ML IV BOLUS
INTRAVENOUS | Status: DC | PRN
  Administered 2017-06-16: 200 mg via INTRAVENOUS

## 2017-06-16 MED ORDER — SODIUM CHLORIDE 0.9 % IV SOLN
INTRAVENOUS | Status: DC
  Administered 2017-06-16: 11:00:00 via INTRAVENOUS

## 2017-06-16 MED ORDER — METHOCARBAMOL 500 MG PO TABS
500.0000 mg | ORAL_TABLET | Freq: Four times a day (QID) | ORAL | Status: DC | PRN
  Administered 2017-06-17: 500 mg via ORAL
  Filled 2017-06-16: qty 1

## 2017-06-16 MED ORDER — METOCLOPRAMIDE HCL 5 MG PO TABS: 5.0000 mg | ORAL_TABLET | Freq: Three times a day (TID) | ORAL | Status: DC | PRN

## 2017-06-16 MED ORDER — 0.9 % SODIUM CHLORIDE (POUR BTL) OPTIME
TOPICAL | Status: DC | PRN
  Administered 2017-06-16: 1000 mL

## 2017-06-16 MED ORDER — FENTANYL CITRATE (PF) 250 MCG/5ML IJ SOLN
INTRAMUSCULAR | Status: AC
  Filled 2017-06-16: qty 5

## 2017-06-16 MED ORDER — BISACODYL 10 MG RE SUPP: 10.0000 mg | Freq: Every day | RECTAL | Status: DC | PRN

## 2017-06-16 MED ORDER — DEXAMETHASONE SODIUM PHOSPHATE 4 MG/ML IJ SOLN
INTRAMUSCULAR | Status: DC | PRN
  Administered 2017-06-16: 4 mg via INTRAVENOUS

## 2017-06-16 MED ORDER — ONDANSETRON HCL 4 MG PO TABS: 4.0000 mg | ORAL_TABLET | Freq: Four times a day (QID) | ORAL | Status: DC | PRN

## 2017-06-16 MED ORDER — ONDANSETRON HCL 4 MG/2ML IJ SOLN
INTRAMUSCULAR | Status: AC
  Filled 2017-06-16: qty 2

## 2017-06-16 MED ORDER — POLYETHYLENE GLYCOL 3350 17 G PO PACK: 17.0000 g | PACK | Freq: Every day | ORAL | Status: DC | PRN

## 2017-06-16 MED ORDER — DOCUSATE SODIUM 100 MG PO CAPS
100.0000 mg | ORAL_CAPSULE | Freq: Two times a day (BID) | ORAL | Status: DC
  Administered 2017-06-16 – 2017-06-17 (×3): 100 mg via ORAL
  Filled 2017-06-16 (×5): qty 1

## 2017-06-16 SURGICAL SUPPLY — 34 items
BENZOIN TINCTURE PRP APPL 2/3 (GAUZE/BANDAGES/DRESSINGS) IMPLANT
BLADE SAW SGTL HD 18.5X60.5X1. (BLADE) IMPLANT
BLADE SURG 21 STRL SS (BLADE) ×3 IMPLANT
BNDG COHESIVE 4X5 TAN STRL (GAUZE/BANDAGES/DRESSINGS) ×3 IMPLANT
BNDG GAUZE ELAST 4 BULKY (GAUZE/BANDAGES/DRESSINGS) ×3 IMPLANT
COVER SURGICAL LIGHT HANDLE (MISCELLANEOUS) ×3 IMPLANT
DRAPE INCISE IOBAN 66X45 STRL (DRAPES) ×3 IMPLANT
DRAPE U-SHAPE 47X51 STRL (DRAPES) ×3 IMPLANT
DRSG ADAPTIC 3X8 NADH LF (GAUZE/BANDAGES/DRESSINGS) ×3 IMPLANT
DRSG PAD ABDOMINAL 8X10 ST (GAUZE/BANDAGES/DRESSINGS) IMPLANT
DURAPREP 26ML APPLICATOR (WOUND CARE) ×3 IMPLANT
ELECT REM PT RETURN 9FT ADLT (ELECTROSURGICAL) ×3
ELECTRODE REM PT RTRN 9FT ADLT (ELECTROSURGICAL) ×1 IMPLANT
GAUZE SPONGE 4X4 12PLY STRL (GAUZE/BANDAGES/DRESSINGS) ×3 IMPLANT
GLOVE BIOGEL PI IND STRL 9 (GLOVE) ×1 IMPLANT
GLOVE BIOGEL PI INDICATOR 9 (GLOVE) ×2
GLOVE SURG ORTHO 9.0 STRL STRW (GLOVE) ×3 IMPLANT
GOWN STRL REUS W/ TWL XL LVL3 (GOWN DISPOSABLE) ×3 IMPLANT
GOWN STRL REUS W/TWL XL LVL3 (GOWN DISPOSABLE) ×6
KIT BASIN OR (CUSTOM PROCEDURE TRAY) ×3 IMPLANT
KIT ROOM TURNOVER OR (KITS) ×3 IMPLANT
NS IRRIG 1000ML POUR BTL (IV SOLUTION) ×3 IMPLANT
PACK ORTHO EXTREMITY (CUSTOM PROCEDURE TRAY) ×3 IMPLANT
PAD ABD 8X10 STRL (GAUZE/BANDAGES/DRESSINGS) ×3 IMPLANT
PAD ARMBOARD 7.5X6 YLW CONV (MISCELLANEOUS) ×6 IMPLANT
SPONGE LAP 18X18 X RAY DECT (DISPOSABLE) IMPLANT
SUT ETHILON 2 0 PSLX (SUTURE) ×6 IMPLANT
SUT VIC AB 2-0 CTB1 (SUTURE) IMPLANT
TOWEL OR 17X24 6PK STRL BLUE (TOWEL DISPOSABLE) IMPLANT
TOWEL OR 17X26 10 PK STRL BLUE (TOWEL DISPOSABLE) IMPLANT
TUBE CONNECTING 12'X1/4 (SUCTIONS) ×1
TUBE CONNECTING 12X1/4 (SUCTIONS) ×2 IMPLANT
WATER STERILE IRR 1000ML POUR (IV SOLUTION) ×3 IMPLANT
YANKAUER SUCT BULB TIP NO VENT (SUCTIONS) ×3 IMPLANT

## 2017-06-16 NOTE — Progress Notes (Signed)
Orthopedic Tech Progress Note Patient Details:  Shaun KittenRobert Ayala 15-Oct-1965 [id number]  Ortho Devices Type of Ortho Device: Postop shoe/boot Ortho Device/Splint Location: applied post op shoe to pt right foot.  pt tolerated application very well.   Right Foot.  Ortho Device/Splint Interventions: Application, Adjustment   Alvina ChouWilliams, Takesha Steger C 06/16/2017, 11:53 AM

## 2017-06-16 NOTE — Progress Notes (Signed)
RN went to patient's room and it smelled like cigarette smoke. RN asked patient if he had been smoking, patient denied smoking in the room twice but then finally admitted to it. Patient's cigarettes and lighter were taken my security and placed in the dirty utility in clear bag with patient label on it. Will continue to monitor and treat per MD orders.

## 2017-06-16 NOTE — Progress Notes (Signed)
PROGRESS NOTE    [name]  ZOX:[id number] DOB: 1966/04/01 DOA: 06/13/2017 PCP: [name], [name], [name]    Brief Narrative:  [age]year-old male who presents with right foot, second toe erythemaandedema. Patient is known to have type 2 diabetes mellitus, and hypertension. For last 10 days he has been experiencing worsening right foot second toe edema, erythema and pain. Positive purulent drainage. His diabetes is uncontrolled. On initial physical examination his blood pressure 141/88, heart rate 110, respiratory rate is 17, temperature 98.5, oxygen saturation 100%.Moist mucous membranes, lungs clear to auscultation bilaterally, heart S1-S2 present rhythmic, the abdomen was soft nontender, protuberant, extremities with right foot second toe edema, erythema, and tenderness to palpation.  Patient was admitted to the hospital working diagnosis of right foot second toe cellulitis to rule out osteomyelitis   Assessment & Plan:   Principal Problem:   Cellulitis of right foot Active Problems:   Essential hypertension, benign   Cellulitis   Diabetes mellitus type 2 in obese (HCC)   Acute osteomyelitis of toe, right (HCC)   1. Right foot cellulitis/ second toe. Patient evaluated by orthopedic surgery Dr. Lajoyce Ayala with recommendations for toe amputation. Patient underwent procedure today, with good toleration, will continue IV zosyn and IV vancomycin for now, with rapid discontinuation, will discuss timing with Dr. Lajoyce Ayala. Continue pain control with ketorolac.   2. T2DM. Continue insulin sliding scale for glucose cover and monitoring, basal insulin of 38 units of insulin glargine, capillary glucose 197, 214, 200, 185. Continue gabapentin for neuropathy.   3. HTN. Stable blood pressure, not on antihypertensive medications.   4. Obesity.Outpatient life style modification.    DVT prophylaxis:enoxaparin Code Status:Full Family Communication:No family at the bedside Disposition  Plan:home   Consultants:    Procedures:    Antimicrobials:     Subjective: Patient somnolent post surgery, no nausea or vomiting, no chest pain or dyspnea. No foot pain.   Objective: Vitals:   06/16/17 0936 06/16/17 0952 06/16/17 1020 06/16/17 1034  BP: 136/78 133/79  97/80  Pulse: 86 83  82  Resp: (!) 30 (!) 36  20  Temp:   (!) 97.2 F (36.2 C) 98.1 F (36.7 C)  TempSrc:    Oral  SpO2: 95% 92%  92%  Weight:    (!) 137.8 kg (303 lb 12.7 oz)  Height:    6' (1.829 m)    Intake/Output Summary (Last 24 hours) at 06/16/2017 1043 Last data filed at 06/16/2017 0905 Gross per 24 hour  Intake 250 ml  Output 5 ml  Net 245 ml   Filed Weights   06/13/17 1950 06/16/17 1034  Weight: 132.5 kg (292 lb) (!) 137.8 kg (303 lb 12.7 oz)    Examination:   General: Not in pain or dyspnea, deconditioned Neurology: Awake and alert, non focal  E ENT: no pallor, no icterus, oral mucosa moist Cardiovascular: No JVD. S1-S2 present, rhythmic, no gallops, rubs, or murmurs. No lower extremity edema. Pulmonary: vesicular breath sounds bilaterally, adequate air movement, no wheezing, rhonchi or rales. Gastrointestinal. Abdomen flat, no organomegaly, non tender, no rebound or guarding Skin. No rashes Musculoskeletal: right foot with dressing in place. No edema or erythema.      Data Reviewed: I have personally reviewed following labs and imaging studies  CBC: Recent Labs  Lab 06/13/17 1953 06/14/17 0252 06/15/17 0519  WBC 13.9* 11.5* 8.3  NEUTROABS 9.9*  --  5.4  HGB 13.1 11.9* 12.4*  HCT 40.0 37.2* 38.8*  MCV 82.6 83.6 84.7  PLT  288 266 252   Basic Metabolic Panel: Recent Labs  Lab 06/13/17 1953 06/14/17 0252 06/15/17 0519  NA 136 133* 137  K 3.5 3.5 3.7  CL 101 99* 103  CO2 24 25 27   GLUCOSE 128* 278* 176*  BUN 9 11 10   CREATININE 0.65 0.79 0.64  CALCIUM 9.3 8.6* 8.9   GFR: Estimated Creatinine Clearance: 158.9 mL/min (by C-G formula based on SCr of  0.64 mg/dL). Liver Function Tests: Recent Labs  Lab 06/13/17 1953  AST 19  ALT 21  ALKPHOS 88  BILITOT 0.5  PROT 7.8  ALBUMIN 3.4*   No results for input(s): LIPASE, AMYLASE in the last 168 hours. No results for input(s): AMMONIA in the last 168 hours. Coagulation Profile: No results for input(s): INR, PROTIME in the last 168 hours. Cardiac Enzymes: No results for input(s): CKTOTAL, CKMB, CKMBINDEX, TROPONINI in the last 168 hours. BNP (last 3 results) No results for input(s): PROBNP in the last 8760 hours. HbA1C: No results for input(s): HGBA1C in the last 72 hours. CBG: Recent Labs  Lab 06/15/17 1650 06/15/17 2054 06/16/17 0633 06/16/17 0922 06/16/17 1011  GLUCAP 178* 139* 197* 214* 200*   Lipid Profile: No results for input(s): CHOL, HDL, LDLCALC, TRIG, CHOLHDL, LDLDIRECT in the last 72 hours. Thyroid Function Tests: No results for input(s): TSH, T4TOTAL, FREET4, T3FREE, THYROIDAB in the last 72 hours. Anemia Panel: No results for input(s): VITAMINB12, FOLATE, FERRITIN, TIBC, IRON, RETICCTPCT in the last 72 hours.    Radiology Studies: I have reviewed all of the imaging during this hospital visit personally     Scheduled Meds: . docusate sodium  100 mg Oral BID  . gabapentin  300 mg Oral QHS  . insulin aspart      . insulin aspart  0-9 Units Subcutaneous TID WC  . insulin aspart  15 Units Subcutaneous TID WC  . insulin glargine  38 Units Subcutaneous Q2200   Continuous Infusions: . sodium chloride    . methocarbamol (ROBAXIN)  IV    . piperacillin-tazobactam (ZOSYN)  IV 3.375 g (06/16/17 16100635)  . vancomycin 1,000 mg (06/16/17 [id number])     LOS: 2 days        Josie Burleigh Annett Gulaaniel Jayton Popelka, MD [facility] Pager [phone]

## 2017-06-16 NOTE — Anesthesia Postprocedure Evaluation (Signed)
Anesthesia Post Note  Patient: Shaun KittenRobert Ayala  Procedure(s) Performed: AMPUTATION TOE right foot 2nd toe (Right )     Patient location during evaluation: PACU Anesthesia Type: General Level of consciousness: awake, awake and alert and oriented Pain management: pain level controlled Vital Signs Assessment: post-procedure vital signs reviewed and stable Respiratory status: spontaneous breathing, nonlabored ventilation and respiratory function stable Cardiovascular status: blood pressure returned to baseline Anesthetic complications: no    Last Vitals:  Vitals:   06/16/17 1020 06/16/17 1034  BP:  97/80  Pulse:  82  Resp:  20  Temp: (!) 36.2 C 36.7 C  SpO2:  92%    Last Pain:  Vitals:   06/16/17 1237  TempSrc:   PainSc: 7                  Ardean Simonich COKER

## 2017-06-16 NOTE — Op Note (Signed)
06/13/2017 - 06/16/2017  9:07 AM  PATIENT:  [name]    PRE-OPERATIVE DIAGNOSIS:  osteomyelitis 2nd right toe  POST-OPERATIVE DIAGNOSIS:  Same  PROCEDURE:  AMPUTATION TOE right foot 2nd toe  SURGEON:  Nadara MustardMarcus V Duda, MD  PHYSICIAN ASSISTANT:None ANESTHESIA:   General  PREOPERATIVE INDICATIONS:  [name] is a  51 y.o. male with a diagnosis of osteomyelitis 2nd right toe who failed conservative measures and elected for surgical management.    The risks benefits and alternatives were discussed with the patient preoperatively including but not limited to the risks of infection, bleeding, nerve injury, cardiopulmonary complications, the need for revision surgery, among others, and the patient was willing to proceed.  OPERATIVE IMPLANTS: none  OPERATIVE FINDINGS: good petechial bleeding  OPERATIVE PROCEDURE: Patient was brought to the operating room and underwent a general anesthetic.  After adequate levels of anesthesia were obtained patient's right lower extremity was prepped using DuraPrep draped into a sterile field a timeout was called.  A V incision was made around the second toe right foot.  The second toe was amputated through the MTP joint.  Electrocautery was used for hemostasis there was good petechial bleeding.  The wound was irrigated with normal saline.  The incision was closed using 2-0 nylon.  A sterile dressing was applied patient was extubated taken to the PACU in stable condition.

## 2017-06-16 NOTE — Progress Notes (Signed)
Ps CBG is 214, reported to Dr Noreene LarssonJoslin , orders noted

## 2017-06-16 NOTE — Transfer of Care (Signed)
Immediate Anesthesia Transfer of Care Note  Patient: Shaun KittenRobert Ayala  Procedure(s) Performed: AMPUTATION TOE right foot 2nd toe (Right )  Patient Location: PACU  Anesthesia Type:General  Level of Consciousness: awake, oriented and patient cooperative  Airway & Oxygen Therapy: Patient Spontanous Breathing and Patient connected to nasal cannula oxygen  Post-op Assessment: Report given to RN and Post -op Vital signs reviewed and stable  Post vital signs: Reviewed  Last Vitals:  Vitals:   06/16/17 0607 06/16/17 0920  BP: (!) 143/92   Pulse: 87   Resp: 18   Temp: 36.9 C (P) 36.6 C  SpO2: 97%     Last Pain:  Vitals:   06/16/17 0607  TempSrc: Oral  PainSc:       Patients Stated Pain Goal: 5 (06/15/17 2227)  Complications: No apparent anesthesia complications

## 2017-06-16 NOTE — Anesthesia Preprocedure Evaluation (Addendum)
Anesthesia Evaluation    Airway Mallampati: III  TM Distance: >3 FB Neck ROM: Full  Mouth opening: Limited Mouth Opening  Dental  (+) Teeth Intact, Dental Advisory Given   Pulmonary former smoker,    breath sounds clear to auscultation       Cardiovascular hypertension, Pt. on medications  Rhythm:Regular Rate:Normal     Neuro/Psych    GI/Hepatic   Endo/Other  diabetes, Type 2  Renal/GU      Musculoskeletal   Abdominal   Peds  Hematology   Anesthesia Other Findings   Reproductive/Obstetrics                             Anesthesia Physical Anesthesia Plan  ASA: III  Anesthesia Plan: General   Post-op Pain Management:    Induction: Intravenous  PONV Risk Score and Plan: Ondansetron and Dexamethasone  Airway Management Planned: LMA  Additional Equipment:   Intra-op Plan:   Post-operative Plan: Extubation in OR  Informed Consent: I have reviewed the patients History and Physical, chart, labs and discussed the procedure including the risks, benefits and alternatives for the proposed anesthesia with the patient or authorized representative who has indicated his/her understanding and acceptance.   Dental advisory given  Plan Discussed with: CRNA, Anesthesiologist and Surgeon  Anesthesia Plan Comments:         Anesthesia Quick Evaluation

## 2017-06-16 NOTE — Care Management Note (Signed)
Case Management Note  Patient Details  Name: Delman KittenRobert Wimberly MRN: [id number] Date of Birth: 1966/03/18  Subjective/Objective:                    Action/Plan:  Await PT eval . Can provide MATCH letter on day of discharge for medication assistance.   [place]   [facility] [place] [address], phone [phone] Expected Discharge Date:                  Expected Discharge Plan:  Home/Self Care  In-House Referral:     Discharge planning Services  CM Consult, Medication Assistance, MATCH Program, Indigent Health Clinic  Post Acute Care Choice:  Durable Medical Equipment Choice offered to:     DME Arranged:    DME Agency:     HH Arranged:    HH Agency:     Status of Service:  In process, will continue to follow  If discussed at Long Length of Stay Meetings, dates discussed:    Additional Comments:  Kingsley PlanWile, Brenton Joines Marie, RN 06/16/2017, 10:46 AM

## 2017-06-16 NOTE — Progress Notes (Signed)
Patient informed he was not to bear weight on his right foot but he insisted on using the walker to get up and go to bathroom.  Will continue to monitor.

## 2017-06-16 NOTE — Progress Notes (Signed)
Patient arrived to 6N27 A&Ox4, IV intact, and VSS.  Noted to have compression wrap on right foot.  Patient very emotional.  Pain 7/10.  Will continue to monitor.

## 2017-06-16 NOTE — Anesthesia Procedure Notes (Signed)
Procedure Name: LMA Insertion Date/Time: 06/16/2017 8:45 AM Performed by: Lovie Cholock, Shayleigh Bouldin K, CRNA Pre-anesthesia Checklist: Patient identified, Emergency Drugs available, Suction available and Patient being monitored Patient Re-evaluated:Patient Re-evaluated prior to induction Oxygen Delivery Method: Circle System Utilized Preoxygenation: Pre-oxygenation with 100% oxygen Induction Type: IV induction Ventilation: Mask ventilation without difficulty LMA: LMA inserted LMA Size: 4.0 Number of attempts: 1 Airway Equipment and Method: Bite block Placement Confirmation: positive ETCO2 and breath sounds checked- equal and bilateral Tube secured with: Tape Dental Injury: Teeth and Oropharynx as per pre-operative assessment

## 2017-06-16 NOTE — Interval H&P Note (Signed)
History and Physical Interval Note:  06/16/2017 6:39 AM  Shaun Kittenobert Emile  has presented today for surgery, with the diagnosis of osteomyelitis 2nd right toe  The various methods of treatment have been discussed with the patient and family. After consideration of risks, benefits and other options for treatment, the patient has consented to  Procedure(s): AMPUTATION TOE right foot 2nd toe (Right) as a surgical intervention .  The patient's history has been reviewed, patient examined, no change in status, stable for surgery.  I have reviewed the patient's chart and labs.  Questions were answered to the patient's satisfaction.     Nadara MustardMarcus V Duda

## 2017-06-16 NOTE — Progress Notes (Signed)
Report given to Pam,RN in Short Stay prior to patient going to surgery this morning

## 2017-06-17 ENCOUNTER — Encounter (HOSPITAL_COMMUNITY): Payer: Self-pay | Admitting: Orthopedic Surgery

## 2017-06-17 LAB — CBC WITH DIFFERENTIAL/PLATELET
Basophils Absolute: 0 10*3/uL (ref 0.0–0.1)
Basophils Relative: 0 %
EOS ABS: 0.1 10*3/uL (ref 0.0–0.7)
Eosinophils Relative: 1 %
HCT: 38.9 % — ABNORMAL LOW (ref 39.0–52.0)
HEMOGLOBIN: 12.4 g/dL — AB (ref 13.0–17.0)
LYMPHS ABS: 2.5 10*3/uL (ref 0.7–4.0)
LYMPHS PCT: 19 %
MCH: 27.4 pg (ref 26.0–34.0)
MCHC: 31.9 g/dL (ref 30.0–36.0)
MCV: 86.1 fL (ref 78.0–100.0)
Monocytes Absolute: 0.7 10*3/uL (ref 0.1–1.0)
Monocytes Relative: 5 %
NEUTROS PCT: 75 %
Neutro Abs: 9.8 10*3/uL — ABNORMAL HIGH (ref 1.7–7.7)
Platelets: 325 10*3/uL (ref 150–400)
RBC: 4.52 MIL/uL (ref 4.22–5.81)
RDW: 14.7 % (ref 11.5–15.5)
WBC: 13.1 10*3/uL — AB (ref 4.0–10.5)

## 2017-06-17 LAB — BASIC METABOLIC PANEL
Anion gap: 13 (ref 5–15)
BUN: 17 mg/dL (ref 6–20)
CO2: 21 mmol/L — ABNORMAL LOW (ref 22–32)
Calcium: 9.1 mg/dL (ref 8.9–10.3)
Chloride: 104 mmol/L (ref 101–111)
Creatinine, Ser: 0.88 mg/dL (ref 0.61–1.24)
GFR calc non Af Amer: >60 mL/min (ref >60)
Glucose, Bld: 144 mg/dL — ABNORMAL HIGH (ref 65–99)
POTASSIUM: 4.7 mmol/L (ref 3.5–5.1)
SODIUM: 138 mmol/L (ref 135–145)

## 2017-06-17 LAB — GLUCOSE, CAPILLARY
GLUCOSE-CAPILLARY: 178 mg/dL — AB (ref 65–99)
GLUCOSE-CAPILLARY: 133 mg/dL — AB (ref 65–99)
GLUCOSE-CAPILLARY: 181 mg/dL — AB (ref 65–99)
Glucose-Capillary: 115 mg/dL — ABNORMAL HIGH (ref 65–99)

## 2017-06-17 MED ORDER — LISINOPRIL 10 MG PO TABS
10.0000 mg | ORAL_TABLET | Freq: Every day | ORAL | Status: DC
  Administered 2017-06-17 – 2017-06-18 (×2): 10 mg via ORAL
  Filled 2017-06-17 (×2): qty 1

## 2017-06-17 NOTE — Progress Notes (Signed)
   Subjective:  Patient is slightly agitated.  Objective:   VITALS:   Vitals:   06/16/17 1820 06/16/17 2204 06/17/17 0158 06/17/17 0545  BP: 110/80 126/80 (!) 144/78 (!) 151/81  Pulse: 88 93 93 97  Resp: 20 20 20 20   Temp: 98.5 F (36.9 C) 99 F (37.2 C) 98.7 F (37.1 C) 97.8 F (36.6 C)  TempSrc: Oral Oral Oral Oral  SpO2: 97% 95% 95% 92%  Weight:      Height:        Neurologically intact Neurovascular intact Sensation intact distally Intact pulses distally Dorsiflexion/Plantar flexion intact Incision: dressing C/D/I and no drainage No cellulitis present Compartment soft   Lab Results  Component Value Date   WBC 13.1 (H) 06/17/2017   HGB 12.4 (L) 06/17/2017   HCT 38.9 (L) 06/17/2017   MCV 86.1 06/17/2017   PLT 325 06/17/2017     Assessment/Plan:  1 Day Post-Op   - Expected postop acute blood loss anemia - will monitor for symptoms - Up with PT/OT - DVT ppx - SCDs, ambulation - NWB operative extremity  [name] 06/17/2017, 8:43 AM [phone]

## 2017-06-17 NOTE — Evaluation (Signed)
Physical Therapy Evaluation Patient Details Name: Shaun KittenRobert Ayala MRN: [id number] DOB: 28-Jul-1966 Today's Date: 06/17/2017   History of Present Illness  51 yo admitted with RLE cellulitis s/p right 2nd toe amputation. PMHx: right humerus fx, HTN, DM  Clinical Impression  Patient is s/p above surgery resulting in functional limitations due to the deficits listed below (see PT Problem List). Able to maintain NWB RLE for a few steps, not functional enough for consistent safe household ambulation, so requesting wheelchair with elevating legrest;  Patient will benefit from skilled PT to increase their independence and safety with mobility to allow discharge to the venue listed below.         Follow Up Recommendations Home health PT;Supervision - Intermittent;Other (comment)(Would benefit form HHPT, though am not sure his insurance status will cover)    Equipment Recommendations  Rolling walker with 5" wheels;3in1 (PT);Wheelchair (measurements PT)    Recommendations for Other Services OT consult     Precautions / Restrictions Precautions Precautions: Fall Restrictions Weight Bearing Restrictions: Yes RLE Weight Bearing: Non weight bearing      Mobility  Bed Mobility Overal bed mobility: Independent                Transfers Overall transfer level: Needs assistance Equipment used: Rolling walker (2 wheeled) Transfers: Sit to/from Stand Sit to Stand: Min guard;Min assist         General transfer comment: No difficulty when he stands and has soem wight on R foot (he insists it's minimal weight); more difficutly with NWB, with dependence on UEs and slower, more labored rise  Ambulation/Gait Ambulation/Gait assistance: Min guard Ambulation Distance (Feet): 10 Feet Assistive device: Rolling walker (2 wheeled) Gait Pattern/deviations: (Hop-to pattern)     General Gait Details: verbal and demo cues for keeping NWB RLE while taking steps; able to take a few steps, some  hopping, which is quite energetically taxing  Careers information officertairs            Wheelchair Mobility    Modified Rankin (Stroke Patients Only)       Balance                                             Pertinent Vitals/Pain Pain Assessment: Faces Faces Pain Scale: Hurts a little bit Pain Location: R foot Pain Descriptors / Indicators: Aching;Discomfort;Grimacing Pain Intervention(s): Limited activity within patient's tolerance;Monitored during session(elevated extremity)    Home Living Family/patient expects to be discharged to:: Private residence Living Arrangements: Other relatives Available Help at Discharge: Family;Available 24 hours/day Type of Home: House Home Access: Level entry     Home Layout: Two level Home Equipment: None      Prior Function Level of Independence: Independent         Comments: owner of catering business, driving, completely independent     Hand Dominance        Extremity/Trunk Assessment   Upper Extremity Assessment Upper Extremity Assessment: Overall WFL for tasks assessed    Lower Extremity Assessment Lower Extremity Assessment: RLE deficits/detail RLE Deficits / Details: Hip, knee WFL; difficulty manintaining NWB RLE consistently RLE Sensation: history of peripheral neuropathy       Communication   Communication: No difficulties  Cognition Arousal/Alertness: Awake/alert Behavior During Therapy: WFL for tasks assessed/performed Overall Cognitive Status: Within Functional Limits for tasks assessed  General Comments General comments (skin integrity, edema, etc.): Took extra time to porblem-solve through managing at home with NWB RLE    Exercises     Assessment/Plan    PT Assessment Patient needs continued PT services  PT Problem List Decreased strength;Decreased range of motion;Decreased activity tolerance;Decreased balance;Decreased mobility;Decreased  knowledge of use of DME;Decreased knowledge of precautions;Obesity;Pain       PT Treatment Interventions DME instruction;Gait training;Stair training;Functional mobility training;Therapeutic activities;Therapeutic exercise;Balance training;Cognitive remediation;Patient/family education;Wheelchair mobility training    PT Goals (Current goals can be found in the Care Plan section)  Acute Rehab PT Goals Patient Stated Goal: Hopes to get back to catering business asap PT Goal Formulation: With patient Time For Goal Achievement: 07/01/17 Potential to Achieve Goals: Good Additional Goals Additional Goal #1: Pt will propel and manage wheelcair parts indeendently including turns and going backwards    Frequency Min 5X/week   Barriers to discharge Inaccessible home environment steps to access bathroom    Co-evaluation               AM-PAC PT "6 Clicks" Daily Activity  Outcome Measure Difficulty turning over in bed (including adjusting bedclothes, sheets and blankets)?: None Difficulty moving from lying on back to sitting on the side of the bed? : None Difficulty sitting down on and standing up from a chair with arms (e.g., wheelchair, bedside commode, etc,.)?: A Lot Help needed moving to and from a bed to chair (including a wheelchair)?: A Little Help needed walking in hospital room?: A Little Help needed climbing 3-5 steps with a railing? : Total 6 Click Score: 17    End of Session Equipment Utilized During Treatment: Gait belt Activity Tolerance: Patient tolerated treatment well Patient left: in chair;with call bell/phone within reach Nurse Communication: Mobility status PT Visit Diagnosis: Unsteadiness on feet (R26.81);Other abnormalities of gait and mobility (R26.89)    Time: [id number] PT Time Calculation (min) (ACUTE ONLY): 32 min   Charges:   PT Evaluation $PT Eval Moderate Complexity: 1 Mod PT Treatments $Gait Training: 8-22 mins   PT G Codes:        Van ClinesHolly  Christain Niznik, PT  Acute Rehabilitation Services Pager [phone] Office [phone]   Levi AlandHolly H Thy Gullikson 06/17/2017, 2:22 PM

## 2017-06-17 NOTE — Progress Notes (Signed)
PROGRESS NOTE    Shaun KittenRobert Welter  ZOX:[id number] DOB: 07/16/1966 DOA: 06/13/2017 PCP: Lizbeth BarkHairston, Mandesia R, FNP    Brief Narrative:  [age]year-old male who presents with right foot, second toe erythemaandedema. Patient is known to have type 2 diabetes mellitus, and hypertension. For last 10 days he has been experiencing worsening right foot second toe edema, erythema and pain. Positive purulent drainage. His diabetes is uncontrolled. On initial physical examination his blood pressure 141/88, heart rate 110, respiratory rate is 17, temperature 98.5, oxygen saturation 100%.Moist mucous membranes, lungs clear to auscultation bilaterally, heart S1-S2 present rhythmic, the abdomen was soft nontender, protuberant, extremities with right foot second toe edema, erythema, and tenderness to palpation.  Patient was admitted to the hospital working diagnosis of right foot second toe cellulitis to rule out osteomyelitis  Assessment & Plan:   Principal Problem:   Cellulitis of right foot Active Problems:   Essential hypertension, benign   Cellulitis   Diabetes mellitus type 2 in obese (HCC)   Acute osteomyelitis of toe, right (HCC)   1. Right foot cellulitis/ second toe. Post op 2nd toe amputation, pain is controlled, non weight bearing per orthopedic recommendations. No signs of ongoing infection, will hold on IV antibiotics for now. Continue physical therapy evaluation.   2. T2DM.Capillary glucose 205, 148, 178, 115, with fasting of 144. Will continue insulin sliding scale plus basal insulin of 38 units, with a pre-meal 15 units of aspart. On gabapentin for neuropathy.   3. HTN.Blood pressure 130 to 150, will continue monitoring, at home on 10 mg of lisinopril that will be resumed.   4. Obesity.Patient is committed to continue life style modifications.    DVT prophylaxis:enoxaparin Code Status:Full Family Communication:No family at the bedside Disposition  Plan:home   Consultants:    Procedures:    Antimicrobials:    Subjective: Right foot pain controlled with analgesics, no nausea or vomiting, no chest pain or dyspnea.   Objective: Vitals:   06/16/17 2204 06/17/17 0158 06/17/17 0545 06/17/17 1050  BP: 126/80 (!) 144/78 (!) 151/81 (!) 131/91  Pulse: 93 93 97 96  Resp: 20 20 20 20   Temp: 99 F (37.2 C) 98.7 F (37.1 C) 97.8 F (36.6 C) 98.3 F (36.8 C)  TempSrc: Oral Oral Oral Oral  SpO2: 95% 95% 92% 92%  Weight:      Height:        Intake/Output Summary (Last 24 hours) at 06/17/2017 1422 Last data filed at 06/16/2017 1834 Gross per 24 hour  Intake 320 ml  Output 1 ml  Net 319 ml   Filed Weights   06/13/17 1950 06/16/17 1034  Weight: 132.5 kg (292 lb) (!) 137.8 kg (303 lb 12.7 oz)    Examination:   General: Not in pain or dyspnea Neurology: Awake and alert, non focal  E ENT: no pallor, no icterus, oral mucosa moist Cardiovascular: No JVD. S1-S2 present, rhythmic, no gallops, rubs, or murmurs. No lower extremity edema. Pulmonary: vesicular breath sounds bilaterally, adequate air movement, no wheezing, rhonchi or rales. Gastrointestinal. Abdomen protuberant, no organomegaly, non tender, no rebound or guarding Skin. Right foot with dressing in place.  Musculoskeletal: no joint deformities     Data Reviewed: I have personally reviewed following labs and imaging studies  CBC: Recent Labs  Lab 06/13/17 1953 06/14/17 0252 06/15/17 0519 06/17/17 0531  WBC 13.9* 11.5* 8.3 13.1*  NEUTROABS 9.9*  --  5.4 9.8*  HGB 13.1 11.9* 12.4* 12.4*  HCT 40.0 37.2* 38.8* 38.9*  MCV 82.6  83.6 84.7 86.1  PLT 288 266 252 325   Basic Metabolic Panel: Recent Labs  Lab 06/13/17 1953 06/14/17 0252 06/15/17 0519 06/17/17 0531  NA 136 133* 137 138  K 3.5 3.5 3.7 4.7  CL 101 99* 103 104  CO2 24 25 27  21*  GLUCOSE 128* 278* 176* 144*  BUN 9 11 10 17   CREATININE 0.65 0.79 0.64 0.88  CALCIUM 9.3 8.6* 8.9  9.1   GFR: Estimated Creatinine Clearance: 144.5 mL/min (by C-G formula based on SCr of 0.88 mg/dL). Liver Function Tests: Recent Labs  Lab 06/13/17 1953  AST 19  ALT 21  ALKPHOS 88  BILITOT 0.5  PROT 7.8  ALBUMIN 3.4*   No results for input(s): LIPASE, AMYLASE in the last 168 hours. No results for input(s): AMMONIA in the last 168 hours. Coagulation Profile: No results for input(s): INR, PROTIME in the last 168 hours. Cardiac Enzymes: No results for input(s): CKTOTAL, CKMB, CKMBINDEX, TROPONINI in the last 168 hours. BNP (last 3 results) No results for input(s): PROBNP in the last 8760 hours. HbA1C: No results for input(s): HGBA1C in the last 72 hours. CBG: Recent Labs  Lab 06/16/17 1146 06/16/17 1654 06/16/17 2126 06/17/17 0814 06/17/17 1258  GLUCAP 185* 205* 148* 178* 115*   Lipid Profile: No results for input(s): CHOL, HDL, LDLCALC, TRIG, CHOLHDL, LDLDIRECT in the last 72 hours. Thyroid Function Tests: No results for input(s): TSH, T4TOTAL, FREET4, T3FREE, THYROIDAB in the last 72 hours. Anemia Panel: No results for input(s): VITAMINB12, FOLATE, FERRITIN, TIBC, IRON, RETICCTPCT in the last 72 hours.    Radiology Studies: I have reviewed all of the imaging during this hospital visit personally     Scheduled Meds: . docusate sodium  100 mg Oral BID  . gabapentin  300 mg Oral QHS  . insulin aspart  0-9 Units Subcutaneous TID WC  . insulin aspart  15 Units Subcutaneous TID WC  . insulin glargine  38 Units Subcutaneous Q2200   Continuous Infusions: . sodium chloride 10 mL/hr at 06/16/17 1045  . methocarbamol (ROBAXIN)  IV    . piperacillin-tazobactam (ZOSYN)  IV 3.375 g (06/17/17 1330)  . vancomycin 1,000 mg (06/17/17 1330)     LOS: 3 days        Nimrat Woolworth Annett Gulaaniel Isiaih Hollenbach, MD [facility] Pager [phone]

## 2017-06-18 DIAGNOSIS — I10 Essential (primary) hypertension: Secondary | ICD-10-CM

## 2017-06-18 DIAGNOSIS — E669 Obesity, unspecified: Secondary | ICD-10-CM

## 2017-06-18 DIAGNOSIS — E1169 Type 2 diabetes mellitus with other specified complication: Principal | ICD-10-CM

## 2017-06-18 LAB — GLUCOSE, CAPILLARY
Glucose-Capillary: 122 mg/dL — ABNORMAL HIGH (ref 65–99)
Glucose-Capillary: 147 mg/dL — ABNORMAL HIGH (ref 65–99)

## 2017-06-18 LAB — CBC WITH DIFFERENTIAL/PLATELET
BASOS ABS: 0.1 10*3/uL (ref 0.0–0.1)
BASOS PCT: 1 %
Eosinophils Absolute: 0.3 10*3/uL (ref 0.0–0.7)
Eosinophils Relative: 4 %
HCT: 39.3 % (ref 39.0–52.0)
HEMOGLOBIN: 12.3 g/dL — AB (ref 13.0–17.0)
LYMPHS PCT: 29 %
Lymphs Abs: 2.4 10*3/uL (ref 0.7–4.0)
MCH: 26.9 pg (ref 26.0–34.0)
MCHC: 31.3 g/dL (ref 30.0–36.0)
MCV: 85.8 fL (ref 78.0–100.0)
MONOS PCT: 6 %
Monocytes Absolute: 0.5 10*3/uL (ref 0.1–1.0)
NEUTROS PCT: 60 %
Neutro Abs: 4.9 10*3/uL (ref 1.7–7.7)
Platelets: 295 10*3/uL (ref 150–400)
RBC: 4.58 MIL/uL (ref 4.22–5.81)
RDW: 14.6 % (ref 11.5–15.5)
WBC: 8.2 10*3/uL (ref 4.0–10.5)

## 2017-06-18 LAB — BASIC METABOLIC PANEL
Anion gap: 8 (ref 5–15)
BUN: 18 mg/dL (ref 6–20)
CALCIUM: 8.7 mg/dL — AB (ref 8.9–10.3)
CO2: 28 mmol/L (ref 22–32)
Chloride: 104 mmol/L (ref 101–111)
Creatinine, Ser: 0.93 mg/dL (ref 0.61–1.24)
GFR calc Af Amer: >60 mL/min (ref >60)
GLUCOSE: 109 mg/dL — AB (ref 65–99)
Potassium: 3.8 mmol/L (ref 3.5–5.1)
Sodium: 140 mmol/L (ref 135–145)

## 2017-06-18 MED ORDER — IBUPROFEN 400 MG PO TABS
400.0000 mg | ORAL_TABLET | Freq: Four times a day (QID) | ORAL | Status: DC | PRN
  Administered 2017-06-18: 400 mg via ORAL
  Filled 2017-06-18: qty 1

## 2017-06-18 MED ORDER — POLYETHYLENE GLYCOL 3350 17 G PO PACK: 17.0000 g | PACK | Freq: Every day | ORAL | 0 refills | Status: DC

## 2017-06-18 MED ORDER — IBUPROFEN 400 MG PO TABS: 400.0000 mg | ORAL_TABLET | Freq: Four times a day (QID) | ORAL | 0 refills | Status: DC | PRN

## 2017-06-18 MED ORDER — OXYCODONE-ACETAMINOPHEN 5-325 MG PO TABS: 1.0000 | ORAL_TABLET | Freq: Four times a day (QID) | ORAL | 0 refills | Status: DC | PRN

## 2017-06-18 NOTE — Plan of Care (Signed)
Pt resting quietly in bed, no acute issues overnight.  

## 2017-06-18 NOTE — Progress Notes (Signed)
Physical Therapy Treatment Patient Details Name: Shaun KittenRobert Ayala MRN: [id number] DOB: Sep 22, 1965 Today's Date: 06/18/2017    History of Present Illness 51 yo admitted with RLE cellulitis s/p right 2nd toe amputation. PMHx: right humerus fx, HTN, DM    PT Comments    Patient was able to ambulate ~6930ft total with min guard/min A and RW. Pt is doing fairly well maintaining NWB status however does use end of post op shoe at times for balance. Stair training this session with conclusion that scooting up on bottom is safest option for pt. Continue to progress as tolerated.    Follow Up Recommendations  Home health PT;Supervision - Intermittent;Other (comment)     Equipment Recommendations  Rolling walker with 5" wheels;3in1 (PT);Wheelchair (measurements PT)    Recommendations for Other Services OT consult     Precautions / Restrictions Precautions Precautions: Fall Restrictions Weight Bearing Restrictions: Yes RLE Weight Bearing: Non weight bearing    Mobility  Bed Mobility Overal bed mobility: Independent                Transfers Overall transfer level: Needs assistance Equipment used: Rolling walker (2 wheeled) Transfers: Sit to/from Stand Sit to Stand: Min guard;Min assist         General transfer comment: cues for safe hand placement; assist to steady RW and for balane upon standing; pt is slightly impulsive  Ambulation/Gait Ambulation/Gait assistance: Min guard;Min assist Ambulation Distance (Feet): (1230ft total) Assistive device: Rolling walker (2 wheeled) Gait Pattern/deviations: Step-to pattern     General Gait Details: cues for posture, sequencing, safe proximity of RW, and R LE positioning for improved COG; pt unsteady with turning and backwards steps especially; taxing on UE    Stairs Stairs: Yes   Stair Management: No rails;Step to pattern;Backwards;With walker(scooting on bottom) Number of Stairs: (1 step backward with RW and 3 steps  scooting) General stair comments: cues for sequencing and technique; pt is unsafe when attempting stairs and required mod A for balance; pt able to scoot up/down steps and assist needed to stand using single rail  Wheelchair Mobility    Modified Rankin (Stroke Patients Only)       Balance                                            Cognition Arousal/Alertness: Awake/alert Behavior During Therapy: WFL for tasks assessed/performed Overall Cognitive Status: Within Functional Limits for tasks assessed                                        Exercises      General Comments        Pertinent Vitals/Pain Pain Assessment: Faces Faces Pain Scale: Hurts a little bit Pain Location: R foot Pain Descriptors / Indicators: Discomfort;Guarding Pain Intervention(s): Limited activity within patient's tolerance;Monitored during session;Repositioned    Home Living                      Prior Function            PT Goals (current goals can now be found in the care plan section) Acute Rehab PT Goals PT Goal Formulation: With patient Time For Goal Achievement: 07/01/17 Potential to Achieve Goals: Good Progress towards PT goals: Progressing toward goals  Frequency    Min 5X/week      PT Plan Current plan remains appropriate    Co-evaluation              AM-PAC PT "6 Clicks" Daily Activity  Outcome Measure  Difficulty turning over in bed (including adjusting bedclothes, sheets and blankets)?: None Difficulty moving from lying on back to sitting on the side of the bed? : None Difficulty sitting down on and standing up from a chair with arms (e.g., wheelchair, bedside commode, etc,.)?: A Lot Help needed moving to and from a bed to chair (including a wheelchair)?: A Little Help needed walking in hospital room?: A Little Help needed climbing 3-5 steps with a railing? : A Lot 6 Click Score: 18    End of Session Equipment  Utilized During Treatment: Gait belt Activity Tolerance: Patient tolerated treatment well Patient left: in chair;with call bell/phone within reach;with nursing/sitter in room Nurse Communication: Mobility status PT Visit Diagnosis: Unsteadiness on feet (R26.81);Other abnormalities of gait and mobility (R26.89)     Time: [id number] PT Time Calculation (min) (ACUTE ONLY): 26 min  Charges:  $Gait Training: 8-22 mins $Therapeutic Activity: 8-22 mins                    G Codes:       [name], PTA Pager: [phone]     [name] 06/18/2017, 12:53 PM

## 2017-06-18 NOTE — Progress Notes (Signed)
Patient discharged to home in stable condition. Verbalizes understanding of all discharge instructions including incision care, discharge medications, and follow up MD visits.  

## 2017-06-18 NOTE — Care Management Note (Signed)
Case Management Note Original Note Created Quintella BatonJulie W. Amerson, RN, BSN  Trauma/Neuro ICU Case Manager [phone]  Patient Details  Name: Shaun KittenRobert Ayala MRN: [id number] Date of Birth: 1966/06/18  Subjective/Objective:    Pt admitted on 06/13/17 with cellulitis of RT foot.  PTA, pt independent, works as a Investment banker, operationalchef.  He lives with his sister.  [name]                Action/Plan: Sister able to provide assist at dc.  Will follow for dc needs as pt progresses.    Expected Discharge Date:  06/18/17               Expected Discharge Plan:  Home/Self Care  In-House Referral:     Discharge planning Services  CM Consult, Medication Assistance, MATCH Program, Indigent Health Clinic  Post Acute Care Choice:  Durable Medical Equipment, Home Health Choice offered to:  Patient  DME Arranged:  Walker rolling DME Agency:  [facility]  HH Arranged[facility]  [facility]  Status of Service:  Completed, signed off  If discussed at Long Length of Stay Meetings, dates discussed:    Discharge Disposition: home/home health   Additional Comments:  06/18/17- 1440- Gredmarie Delange RN, CM- pt for d/c home today- orders placed for DME [facility]-  Pt has no insurance and will need to be assessed for [facility]- [facility] which will be delivered to room and he will assess pt for charity care [facility].   Zenda AlpersWebster, Reliez ValleyKristi Hall, RN 06/18/2017, 2:42 PM [phone]- [phone] weekend coverage

## 2017-06-18 NOTE — Discharge Summary (Addendum)
Physician Discharge Summary  [name] WUJ:[id number] DOB: 1966-04-17 DOA: 06/13/2017  PCP: Lizbeth Bark, FNP  Admit date: 06/13/2017 Discharge date: 06/18/2017  Admitted From: Home Disposition:  Home  Recommendations for Outpatient Follow-up:  1. Follow up with PCP in 1 week 2. Patient has been placed on acetaminophen and ibuprofen for pain control 3.         Non weight bearing on right foot for 2 weeks until sutures get removed.   Home Health: Yes  Equipment/Devices: Walker   Discharge Condition: Stable CODE STATUS: full  Diet recommendation: Heart healthy and diabetic prudent  Brief/Interim Summary: 51year-old male who presents with right foot, second toe erythemaandedema. Patient is known to have type 2 diabetes mellitus, and hypertension. For last 10 days he has been experiencing worsening right foot second toe edema, erythema and pain. Positive purulent drainage. His diabetes is uncontrolled. On initial physical examination his blood pressure 141/88, heart rate 110, respiratory rate is 17, temperature 98.5, oxygen saturation 100%.Moist mucous membranes, lungs clear to auscultation bilaterally, heart S1-S2 present rhythmic, the abdomen was soft nontender, protuberant, extremities with right foot second toe edema, erythema, and tenderness to palpation.sodium 136, potassium 3.5, chloride 101, bicarbonate 24, glucose 128, BUN 9, creatinine 0.65, white count 13.9, hemoglobin 13.1, hematocrit 40.0, platelets 288. X-ray of his right second toe showed no obvious fracture or bone destruction. EKG normal sinus rhythm, normal axis, normal intervals, 86 bpm.   Patient was admitted to the hospital working diagnosis of right foot second toe cellulitis to rule out osteomyelitis  1. Right foot second toe osteomyelitis. Patient was admitted to the hospital, he was placed on broad spectrum intravenous antibiotic therapy, further workup with right foot MRI showed no definite findings  suggesting osteomyelitis,but the study was technically difficult due to motion artifact. Patient had persistent edema, erythema and purulence despite antibiotic therapy, and surgery was consulted, with recommendations for toe amputation. Patient underwent November 9 amputation off his second toe of his right foot. No major complications, antibiotics were discontinued, patient was seen by physical therapy, recommendation is to home therapy, rolling walker. Patient will be nonweightbearing for the next 2 weeks, until sutures get removed.  2. Type 2 diabetes mellitus. Patient was continued on insulin regimen, including insulin sliding-scale in the basal long-acting insulin. His capillary glucose remained well-controlled. Patient will continue gabapentin for his peripheral diabetic neuropathy.  3. Hypertension. Blood pressure remained well-controlled, lisinopril was resumed during his hospitalization.  4. Obesity. Patient is committed for lifestyle modification, follow-up as an outpatient.    Discharge Diagnoses:  Principal Problem:   Cellulitis of right foot Active Problems:   Essential hypertension, benign   Cellulitis   Diabetes mellitus type 2 in obese [facility])   Acute osteomyelitis of toe, right (HCC)    Discharge Instructions   Allergies as of 06/18/2017      Reactions   No Known Allergies       Medication List    STOP taking these medications   diclofenac 75 MG EC tablet Commonly known as:  VOLTAREN   TRUEPLUS LANCETS 28G Misc     TAKE these medications   cyclobenzaprine 10 MG tablet Commonly known as:  FLEXERIL Take 1 tablet (10 mg total) by mouth 2 (two) times daily as needed for muscle spasms.   gabapentin 300 MG capsule Commonly known as:  NEURONTIN Take 1 capsule (300 mg total) by mouth 2 (two) times daily. What changed:  when to take this   ibuprofen 400 MG tablet  Commonly known as:  ADVIL,MOTRIN Take 1 tablet (400 mg total) every 6 (six) hours as needed by  mouth for moderate pain (foot pain.).   insulin aspart 100 UNIT/ML FlexPen Commonly known as:  NOVOLOG FLEXPEN 0-15 Units, Subcutaneous, 3 times daily with meals CBG < 70: implement hypoglycemia protocol-call MD CBG 70 - 120: 0 units CBG 121 - 150: 2 units CBG 151 - 200: 3 units CBG 201 - 250: 5 units CBG 251 - 300: 8 units CBG 301 - 350: 11 units CBG 351 - 400: 15 units CBG > 400:   Insulin Glargine 100 UNIT/ML Solostar Pen Commonly known as:  LANTUS SOLOSTAR Inject 40 Units into the skin daily at 10 pm. What changed:  how much to take   insulin lispro 100 UNIT/ML KiwkPen Commonly known as:  HUMALOG KWIKPEN Inject 0-0.15 mLs (0-15 Units total) into the skin 3 (three) times daily with meals.   lisinopril 10 MG tablet Commonly known as:  PRINIVIL,ZESTRIL Take 1 tablet (10 mg total) by mouth daily. What changed:  when to take this   metFORMIN 500 MG 24 hr tablet Commonly known as:  GLUCOPHAGE XR Take 2 tablets (1,000 mg total) by mouth 2 (two) times daily with a meal. What changed:  how much to take   oxyCODONE-acetaminophen 5-325 MG tablet Commonly known as:  ROXICET Take 1 tablet every 6 (six) hours as needed by mouth for severe pain (sever right foot pain.).   polyethylene glycol packet Commonly known as:  MIRALAX / GLYCOLAX Take 17 g daily by mouth.            Durable Medical Equipment  (From admission, onward)        Start     Ordered   06/18/17 1418  For home use only DME Dan HumphreysWalker  [facility](Walkers)  Once    Question:  Patient needs a walker to treat with the following condition  Answer:  Ambulatory dysfunction   06/18/17 1417     Follow-up Information    Nadara Mustarduda, Marcus V, MD Follow up in 1 week(s).   Specialty:  Orthopedic Surgery Contact information: [address] [place] [address] [phone]        [facility]. Schedule an appointment as soon as possible for a visit.   Specialty:  Primary Care Contact information: [address] [place] [id number] [phone]         Allergies  Allergen Reactions  . No Known Allergies     Consultations:  Orthopedics   Procedures/Studies: Dg Ribs Unilateral W/chest Left  Result Date: 05/30/2017 CLINICAL DATA:  Acute left-sided rib pain without known injury. EXAM: LEFT RIBS AND CHEST - 3+ VIEW COMPARISON:  Radiographs of September 02, 2016. FINDINGS: No fracture or other bone lesions are seen involving the ribs. There is no evidence of pneumothorax or pleural effusion. Both lungs are clear. Heart size and mediastinal contours are within normal limits. IMPRESSION: Normal left ribs.  No acute cardiopulmonary abnormality seen. Electronically Signed   By: Lupita RaiderJames  Green Jr, M.D.   On: 05/30/2017 15:42   Mr Foot Right Wo Contrast  Result Date: 06/14/2017 CLINICAL DATA:  Stumped second toe a week ago and now the tissue is white at the tip of the toe. Top of toe is red. EXAM: MRI OF THE RIGHT FOREFOOT WITHOUT CONTRAST TECHNIQUE: Multiplanar, multisequence MR imaging of the right second toe was performed. No intravenous contrast was administered. COMPARISON:  Radiographs 06/13/2017  FINDINGS: Bones/Joint/Cartilage Examination is limited by motion artifact. Marrow signal intensities in the right second toe appear homogeneous and normal. No definite evidence of osteomyelitis. Joint spaces are preserved. Ligaments Not well visualized.  Appear grossly intact. Muscles and Tendons Limited visualization. Extensor and flexor tendons appear grossly intact. Soft tissues Mild soft tissue edema demonstrated along the dorsum of the foot. IMPRESSION: Technically limited study due to motion artifact. No definitive findings to suggest osteomyelitis. Electronically Signed   By: Burman Nieves M.D.   On: 06/14/2017 05:43   Dg Toe 2nd Right  Result Date: 06/13/2017 CLINICAL DATA:  Stubbed toe on a rock, pain and swelling EXAM: RIGHT SECOND TOE COMPARISON:  None.  FINDINGS: Limited evaluation of the distal digit due to flexed positioning of the toe. No obvious fracture. Soft tissue injury distal portion of the digit. No obvious bone destruction. No radiopaque foreign body. IMPRESSION: Limited by positioning.  No obvious fracture or bone destruction. Electronically Signed   By: Jasmine Pang M.D.   On: 06/13/2017 23:29       Subjective: Patient feeling better, no nausea or vomiting, pain is controlled with analgesics.   Discharge Exam: Vitals:   06/17/17 2042 06/18/17 0500  BP: (!) 145/91 138/85  Pulse: 91 83  Resp:  19  Temp: 98.4 F (36.9 C) 98.1 F (36.7 C)  SpO2: 96% 96%   Vitals:   06/17/17 1050 06/17/17 1841 06/17/17 2042 06/18/17 0500  BP: (!) 131/91 (!) 160/78 (!) 145/91 138/85  Pulse: 96 88 91 83  Resp: 20 20  19   Temp: 98.3 F (36.8 C) 98.2 F (36.8 C) 98.4 F (36.9 C) 98.1 F (36.7 C)  TempSrc: Oral Oral Oral Oral  SpO2: 92% 90% 96% 96%  Weight:      Height:        General: Pt is alert, awake, not in acute distress E ENT: no pallor, moist mucous membranes.  Cardiovascular: RRR, S1/S2 +, no rubs, no gallops Respiratory: CTA bilaterally, no wheezing, no rhonchi Abdominal: Soft, NT, ND, bowel sounds + Extremities: no edema, no cyanosis/ right foot with dressing in place.     The results of significant diagnostics from this hospitalization (including imaging, microbiology, ancillary and laboratory) are listed below for reference.     Microbiology: Recent Results (from the past 240 hour(s))  Surgical pcr screen     Status: None   Collection Time: 06/16/17 12:10 AM  Result Value Ref Range Status   MRSA, PCR NEGATIVE NEGATIVE Final   Staphylococcus aureus NEGATIVE NEGATIVE Final    Comment: (NOTE) The Xpert SA Assay (FDA approved for NASAL specimens in patients 14 years of age and older), is one component of a comprehensive surveillance program. It is not intended to diagnose infection nor to guide or monitor  treatment.      Labs: BNP (last 3 results) No results for input(s): BNP in the last 8760 hours. Basic Metabolic Panel: Recent Labs  Lab 06/13/17 1953 06/14/17 0252 06/15/17 0519 06/17/17 0531 06/18/17 0458  NA 136 133* 137 138 140  K 3.5 3.5 3.7 4.7 3.8  CL 101 99* 103 104 104  CO2 24 25 27  21* 28  GLUCOSE 128* 278* 176* 144* 109*  BUN 9 11 10 17 18   CREATININE 0.65 0.79 0.64 0.88 0.93  CALCIUM 9.3 8.6* 8.9 9.1 8.7*   Liver Function Tests: Recent Labs  Lab 06/13/17 1953  AST 19  ALT 21  ALKPHOS 88  BILITOT 0.5  PROT 7.8  ALBUMIN  3.4*   No results for input(s): LIPASE, AMYLASE in the last 168 hours. No results for input(s): AMMONIA in the last 168 hours. CBC: Recent Labs  Lab 06/13/17 1953 06/14/17 0252 06/15/17 0519 06/17/17 0531 06/18/17 0458  WBC 13.9* 11.5* 8.3 13.1* 8.2  NEUTROABS 9.9*  --  5.4 9.8* 4.9  HGB 13.1 11.9* 12.4* 12.4* 12.3*  HCT 40.0 37.2* 38.8* 38.9* 39.3  MCV 82.6 83.6 84.7 86.1 85.8  PLT 288 266 252 325 295   Cardiac Enzymes: No results for input(s): CKTOTAL, CKMB, CKMBINDEX, TROPONINI in the last 168 hours. BNP: Invalid input(s): POCBNP CBG: Recent Labs  Lab 06/17/17 1258 06/17/17 1724 06/17/17 2100 06/18/17 0728 06/18/17 1219  GLUCAP 115* 133* 181* 122* 147*   D-Dimer No results for input(s): DDIMER in the last 72 hours. Hgb A1c No results for input(s): HGBA1C in the last 72 hours. Lipid Profile No results for input(s): CHOL, HDL, LDLCALC, TRIG, CHOLHDL, LDLDIRECT in the last 72 hours. Thyroid function studies No results for input(s): TSH, T4TOTAL, T3FREE, THYROIDAB in the last 72 hours.  Invalid input(s): FREET3 Anemia work up No results for input(s): VITAMINB12, FOLATE, FERRITIN, TIBC, IRON, RETICCTPCT in the last 72 hours. Urinalysis    Component Value Date/Time   COLORURINE YELLOW 09/02/2016 1300   APPEARANCEUR CLEAR 09/02/2016 1300   LABSPEC 1.025 09/02/2016 1300   PHURINE 5.0 09/02/2016 1300   GLUCOSEU  >=500 (A) 09/02/2016 1300   HGBUR NEGATIVE 09/02/2016 1300   BILIRUBINUR NEGATIVE 09/02/2016 1300   KETONESUR 5 (A) 09/02/2016 1300   PROTEINUR NEGATIVE 09/02/2016 1300   NITRITE NEGATIVE 09/02/2016 1300   LEUKOCYTESUR NEGATIVE 09/02/2016 1300   Sepsis Labs Invalid input(s): PROCALCITONIN,  WBC,  LACTICIDVEN Microbiology Recent Results (from the past 240 hour(s))  Surgical pcr screen     Status: None   Collection Time: 06/16/17 12:10 AM  Result Value Ref Range Status   MRSA, PCR NEGATIVE NEGATIVE Final   Staphylococcus aureus NEGATIVE NEGATIVE Final    Comment: (NOTE) The Xpert SA Assay (FDA approved for NASAL specimens in patients 50 years of age and older), is one component of a comprehensive surveillance program. It is not intended to diagnose infection nor to guide or monitor treatment.      Time coordinating discharge: 45 minutes  SIGNED:   Coralie KeensMauricio Daniel Arrien, MD  [facility] 06/18/2017, 1:46 PM Pager [phone]  If 7PM-7AM, please contact night-coverage [email or web address] Password [email or web address]

## 2017-06-19 ENCOUNTER — Telehealth (INDEPENDENT_AMBULATORY_CARE_PROVIDER_SITE_OTHER): Payer: Self-pay | Admitting: Orthopedic Surgery

## 2017-06-19 NOTE — Telephone Encounter (Signed)
Dr August Saucerean did an injection a couple of months ago and patient said it didn't help, he would like to know if theres another solution for his back pain. His call back number is [phone]

## 2017-06-19 NOTE — Telephone Encounter (Signed)
Please advise. Thanks.  

## 2017-06-19 NOTE — Telephone Encounter (Signed)
IC no answer. LMVM for him advising per Dr August Saucerean.

## 2017-06-19 NOTE — Telephone Encounter (Signed)
His other options would be medication and physical therapy versus surgical evaluation.  If he wants surgical evaluation I would have him see Dr. Ophelia CharterYates or Dr. Bing Matterneck,.

## 2017-06-19 NOTE — Telephone Encounter (Signed)
Okay to refer to Dr. Alvester MorinNewton for consideration of epidural steroid injections

## 2017-06-19 NOTE — Telephone Encounter (Signed)
IC patient he stated he had already had ESI and it did not help. Any other suggestions?

## 2017-06-20 ENCOUNTER — Telehealth: Payer: Self-pay | Admitting: Adult Health Nurse Practitioner

## 2017-06-20 NOTE — Telephone Encounter (Signed)
Wants to become a pt here

## 2017-06-21 ENCOUNTER — Telehealth (INDEPENDENT_AMBULATORY_CARE_PROVIDER_SITE_OTHER): Payer: Self-pay | Admitting: Radiology

## 2017-06-21 ENCOUNTER — Telehealth (INDEPENDENT_AMBULATORY_CARE_PROVIDER_SITE_OTHER): Payer: Self-pay | Admitting: Orthopedic Surgery

## 2017-06-21 NOTE — Telephone Encounter (Signed)
[name] calling right toe amputation status post. I called to advise surgical dressing can be removed and dry dressing can be applied if soiled. Patient to remain nonweightbearing.

## 2017-06-21 NOTE — Telephone Encounter (Signed)
Noted patient is status post ray amputation. He would be nonweightbearing.

## 2017-06-21 NOTE — Telephone Encounter (Signed)
Shaun JerseyStacey [facility]ala (PT[facility] called advised the patient does not feel that he needs (PT) The number to contact Misty StanleyStacey is [phone]

## 2017-06-26 ENCOUNTER — Encounter (INDEPENDENT_AMBULATORY_CARE_PROVIDER_SITE_OTHER): Payer: Self-pay | Admitting: Orthopedic Surgery

## 2017-06-26 ENCOUNTER — Other Ambulatory Visit (INDEPENDENT_AMBULATORY_CARE_PROVIDER_SITE_OTHER): Payer: Self-pay | Admitting: Family

## 2017-06-26 ENCOUNTER — Ambulatory Visit (INDEPENDENT_AMBULATORY_CARE_PROVIDER_SITE_OTHER): Payer: Self-pay | Admitting: Orthopedic Surgery

## 2017-06-26 ENCOUNTER — Telehealth (INDEPENDENT_AMBULATORY_CARE_PROVIDER_SITE_OTHER): Payer: Self-pay | Admitting: Orthopedic Surgery

## 2017-06-26 VITALS — Ht 72.0 in | Wt 303.0 lb

## 2017-06-26 DIAGNOSIS — E669 Obesity, unspecified: Secondary | ICD-10-CM

## 2017-06-26 DIAGNOSIS — E1169 Type 2 diabetes mellitus with other specified complication: Secondary | ICD-10-CM

## 2017-06-26 DIAGNOSIS — M86171 Other acute osteomyelitis, right ankle and foot: Secondary | ICD-10-CM

## 2017-06-26 DIAGNOSIS — Z89421 Acquired absence of other right toe(s): Secondary | ICD-10-CM

## 2017-06-26 MED ORDER — OXYCODONE-ACETAMINOPHEN 5-325 MG PO TABS: 1.0000 | ORAL_TABLET | Freq: Three times a day (TID) | ORAL | 0 refills | Status: DC | PRN

## 2017-06-26 NOTE — Telephone Encounter (Signed)
Patient left another message stating that he forgot to fu on his request for an RX refill on his pain medication.

## 2017-06-26 NOTE — Telephone Encounter (Signed)
Patient left a message on Friday requesting an RX refill on his pain medication.  CB#[phone].  Thank you.

## 2017-06-26 NOTE — Telephone Encounter (Signed)
Patient has appointment this afternoon.

## 2017-06-26 NOTE — Progress Notes (Signed)
   Post-Op Visit Note   Patient: Shaun KittenRobert Ayala           Date of Birth: September 01, 1965           MRN: [id number] Visit Date: 06/26/2017 PCP: Lizbeth BarkHairston, Mandesia R, FNP  Chief Complaint:  Chief Complaint  Patient presents with  . Right Foot - Routine Post Op    Right foot 2nd ray amputation     HPI:  HPI Patient is a 51 year old gentleman seen status post right foot second toe amputation on 06/16/17. Today is weight bearing through heel with a walker. Surgical dressing removed.  Ortho Exam Incision approximated with sutures, healing well. No gaping. No drainage or redness. No sign of infection.   Visit Diagnoses:  1. Acute osteomyelitis of toe, right (HCC)   2. Diabetes mellitus type 2 in obese [facility](HCC)     Plan: begin daily dial soap cleansing. Apply dry dressing daily. Minimize weight bearing. Follow up in office in 1 week, eval for suture removal.  Follow-Up Instructions: Return in about 1 week (around 07/03/2017).   Imaging: No results found.  Orders:  No orders of the defined types were placed in this encounter.  No orders of the defined types were placed in this encounter.    PMFS History: Patient Active Problem List   Diagnosis Date Noted  . Acute osteomyelitis of toe, right (HCC)   . Cellulitis 06/14/2017  . Cellulitis of right foot 06/14/2017  . Diabetes mellitus type 2 in obese (HCC) 06/14/2017  . Rib pain on left side 05/30/2017  . Chest wall muscle strain, initial encounter 05/30/2017  . Fracture of humerus, proximal, right, closed 09/03/2016  . Fracture of humeral shaft, right, closed 09/02/2016  . Diabetes (HCC) 09/02/2016  . Essential hypertension, benign 09/02/2016   Past Medical History:  Diagnosis Date  . Diabetes mellitus without complication (HCC)   . Fracture closed, humerus    right  . Fracture of humeral shaft, right, closed 09/02/2016  . Fracture of humerus, proximal, right, closed 09/03/2016  . Hypertension     Family History  Problem  Relation Age of Onset  . Sjogren's syndrome Mother   . CAD Father   . Diabetes Father     Past Surgical History:  Procedure Laterality Date  . AMPUTATION TOE right foot 2nd toe Right 06/16/2017   Performed by Nadara Mustarduda, Marcus V[facility]  . CHOLECYSTECTOMY    . OPEN REDUCTION INTERNAL FIXATION (ORIF) RIGHT  HUMERAL SHAFT FRACTURE Right 09/02/2016   Performed by Myrene GalasHandy, Michael, [facility]  . WISDOM TOOTH EXTRACTION     Social History   Occupational History  . Not on file  Tobacco Use  . Smoking status: Former Smoker    Last attempt to quit: 08/09/2011    Years since quitting: 5.8  . Smokeless tobacco: Never Used  Substance and Sexual Activity  . Alcohol use: Yes    Comment: 3 drinks per week  . Drug use: No  . Sexual activity: Not on file

## 2017-06-27 ENCOUNTER — Other Ambulatory Visit: Payer: Self-pay | Admitting: Family Medicine

## 2017-06-27 DIAGNOSIS — I10 Essential (primary) hypertension: Secondary | ICD-10-CM

## 2017-06-27 DIAGNOSIS — E118 Type 2 diabetes mellitus with unspecified complications: Secondary | ICD-10-CM

## 2017-06-27 DIAGNOSIS — Z794 Long term (current) use of insulin: Principal | ICD-10-CM

## 2017-06-27 MED FILL — TRUE METRIX TEST STRIP: 25 days supply | Qty: 100 | Fill #2

## 2017-06-27 MED FILL — LISINOPRIL 10 MG TABS: 10 | 30 days supply | Qty: 30 | Fill #0

## 2017-06-27 MED FILL — $LANTUS 100 UNITS/ML VIAL: 100 | 25 days supply | Qty: 10 | Fill #2

## 2017-06-27 NOTE — Telephone Encounter (Signed)
I called left vm to advise patient rx at front desk for pick up.

## 2017-07-03 ENCOUNTER — Encounter (INDEPENDENT_AMBULATORY_CARE_PROVIDER_SITE_OTHER): Payer: Self-pay | Admitting: Family

## 2017-07-03 ENCOUNTER — Ambulatory Visit (INDEPENDENT_AMBULATORY_CARE_PROVIDER_SITE_OTHER): Payer: Self-pay

## 2017-07-03 ENCOUNTER — Ambulatory Visit (INDEPENDENT_AMBULATORY_CARE_PROVIDER_SITE_OTHER): Payer: Self-pay | Admitting: Family

## 2017-07-03 DIAGNOSIS — Z89421 Acquired absence of other right toe(s): Secondary | ICD-10-CM

## 2017-07-03 MED ORDER — OXYCODONE-ACETAMINOPHEN 5-325 MG PO TABS: 1.0000 | ORAL_TABLET | Freq: Two times a day (BID) | ORAL | 0 refills | Status: DC | PRN

## 2017-07-03 NOTE — Progress Notes (Signed)
Post-Op Visit Note   Patient: Shaun KittenRobert Ayala           Date of Birth: 1965/12/18           MRN: [id number] Visit Date: 07/03/2017 PCP: Lizbeth BarkHairston, Mandesia R, FNP  Chief Complaint:  Chief Complaint  Patient presents with  . Right Foot - Follow-up    HPI:  HPI Patient is a 51 year old gentleman seen status post right foot second toe amputation on 06/16/17. Today is weight bearing through heel with a cane.   Complaining of some redness and tenderness to 5th MT. Pain with ambulation. No recent injury.   Ortho Exam Incision approximated with sutures, healing well. 5 mm area of distal gaping. No drainage or redness. No sign of infection.  . Tenderness along lateral right foot, entirety of 5th MT.  dopplered DP pulse, triphasic.   Visit Diagnoses:  1. H/O amputation of lesser toe, right (HCC)     Plan: continue daily dial soap cleansing. Apply dry dressing daily. Minimize weight bearing. Follow up in office in 1 week, eval for suture removal. Discussed importance of minimizing weight bearing.  Follow-Up Instructions: Return in about 1 week (around 07/10/2017).   Imaging: Xr Foot Complete Right  Result Date: 07/03/2017 Radiographs of right foot negative for fracture. No acute finding of 5th metatarsal.    Orders:  Orders Placed This Encounter  Procedures  . XR Foot Complete Right   Meds ordered this encounter  Medications  . oxyCODONE-acetaminophen (ROXICET) 5-325 MG tablet    Sig: Take 1 tablet by mouth 2 (two) times daily as needed for severe pain (sever right foot pain.).    Dispense:  20 tablet    Refill:  0     PMFS History: Patient Active Problem List   Diagnosis Date Noted  . H/O amputation of lesser toe, right (HCC) 06/26/2017  . Acute osteomyelitis of toe, right (HCC)   . Cellulitis 06/14/2017  . Cellulitis of right foot 06/14/2017  . Diabetes mellitus type 2 in obese (HCC) 06/14/2017  . Rib pain on left side 05/30/2017  . Chest wall muscle strain,  initial encounter 05/30/2017  . Fracture of humerus, proximal, right, closed 09/03/2016  . Fracture of humeral shaft, right, closed 09/02/2016  . Diabetes (HCC) 09/02/2016  . Essential hypertension, benign 09/02/2016   Past Medical History:  Diagnosis Date  . Diabetes mellitus without complication (HCC)   . Fracture closed, humerus    right  . Fracture of humeral shaft, right, closed 09/02/2016  . Fracture of humerus, proximal, right, closed 09/03/2016  . Hypertension     Family History  Problem Relation Age of Onset  . Sjogren's syndrome Mother   . CAD Father   . Diabetes Father     Past Surgical History:  Procedure Laterality Date  . AMPUTATION TOE Right 06/16/2017   Procedure: AMPUTATION TOE right foot 2nd toe;  Surgeon: Nadara Mustarduda, Marcus V, MD;  [facility];  Service: Orthopedics;  Laterality: Right;  . CHOLECYSTECTOMY    . ORIF HUMERUS FRACTURE Right 09/02/2016   Procedure: OPEN REDUCTION INTERNAL FIXATION (ORIF) RIGHT  HUMERAL SHAFT FRACTURE;  Surgeon: Myrene GalasMichael Handy, MD;  [facility];  Service: Orthopedics;  Laterality: Right;  . WISDOM TOOTH EXTRACTION     Social History   Occupational History  . Not on file  Tobacco Use  . Smoking status: Former Smoker    Last attempt to quit: 08/09/2011    Years since quitting: 5.9  . Smokeless  tobacco: Never Used  Substance and Sexual Activity  . Alcohol use: Yes    Comment: 3 drinks per week  . Drug use: No  . Sexual activity: Not on file

## 2017-07-05 ENCOUNTER — Ambulatory Visit: Payer: Self-pay | Admitting: Orthotics

## 2017-07-05 DIAGNOSIS — M722 Plantar fascial fibromatosis: Secondary | ICD-10-CM

## 2017-07-05 DIAGNOSIS — S91209D Unspecified open wound of unspecified toe(s) with damage to nail, subsequent encounter: Secondary | ICD-10-CM

## 2017-07-05 NOTE — Progress Notes (Signed)
Upon evaluation, decided against casting at this time; has an active ulcer w/ fat pad exposed under care of Dr. Lajoyce Cornersuda.

## 2017-07-10 ENCOUNTER — Telehealth (INDEPENDENT_AMBULATORY_CARE_PROVIDER_SITE_OTHER): Payer: Self-pay | Admitting: Radiology

## 2017-07-10 ENCOUNTER — Ambulatory Visit (INDEPENDENT_AMBULATORY_CARE_PROVIDER_SITE_OTHER): Payer: Self-pay | Admitting: Orthopedic Surgery

## 2017-07-10 ENCOUNTER — Encounter (INDEPENDENT_AMBULATORY_CARE_PROVIDER_SITE_OTHER): Payer: Self-pay | Admitting: Orthopedic Surgery

## 2017-07-10 DIAGNOSIS — Z89421 Acquired absence of other right toe(s): Secondary | ICD-10-CM

## 2017-07-10 MED ORDER — OXYCODONE-ACETAMINOPHEN 5-325 MG PO TABS: 1.0000 | ORAL_TABLET | Freq: Three times a day (TID) | ORAL | 0 refills | Status: DC | PRN

## 2017-07-10 MED ORDER — DOXYCYCLINE HYCLATE 100 MG PO TABS
100.0000 mg | ORAL_TABLET | Freq: Two times a day (BID) | ORAL | 0 refills | Status: DC
Start: 2017-07-10 — End: 2017-08-21

## 2017-07-10 NOTE — Progress Notes (Signed)
Office Visit Note   Patient: [name]           Date of Birth: 1966-04-08           MRN: [id number] Visit Date: 07/10/2017              Requested by: Lizbeth BarkHairston, Mandesia R, FNP [address] [place], [place] [address] PCP: Lizbeth BarkHairston, Mandesia R, FNP  No chief complaint on file.     HPI: Patient is a 51 year old gentleman status post right foot second toe amputation.  Patient has been ambulating his postoperative shoe he complains of pain at the surgical incision he complains of increasing pain over the lateral aspect of his foot.  Assessment & Plan: Visit Diagnoses:  1. H/O amputation of lesser toe, right (HCC)     Plan: Discussed with the patient importance of nonweightbearing on the right lower extremity.  Discussed that this new area over the lateral aspect of his foot is from the increased pressure from walking discussed that the ischemic changes over the second toe incision are secondary to swelling with weightbearing.  A prescription is sent to his pharmacy for doxycycline he was given a prescription for Percocet.  Follow-Up Instructions: Return in about 1 week (around 07/17/2017).   Ortho Exam  Patient is alert, oriented, no adenopathy, well-dressed, normal affect, normal respiratory effort. Examination patient has ischemic changes over the second toe amputation site.  There is some cellulitis of the third toe.  He has an ischemic pressure area over the midshaft of the fifth metatarsal.  Patient has a strong posterior tibial pulse he has good hair growth down to his foot.  There is no ascending cellulitis there is no purulence.  Imaging: No results found. No images are attached to the encounter.  Labs: Lab Results  Component Value Date   HGBA1C 6.7 12/01/2016   HGBA1C 11.7 (H) 09/02/2016    @LABSALLVALUES (HGBA1)@  There is no height or weight on file to calculate BMI.  Orders:  No orders of the defined types were placed in this encounter.  Meds ordered  this encounter  Medications  . doxycycline (VIBRA-TABS) 100 MG tablet    Sig: Take 1 tablet (100 mg total) by mouth 2 (two) times daily.    Dispense:  60 tablet    Refill:  0  . oxyCODONE-acetaminophen (PERCOCET/ROXICET) 5-325 MG tablet    Sig: Take 1 tablet by mouth every 8 (eight) hours as needed for severe pain.    Dispense:  30 tablet    Refill:  0     Procedures: No procedures performed  Clinical Data: No additional findings.  ROS:  All other systems negative, except as noted in the HPI. Review of Systems  Objective: Vital Signs: There were no vitals taken for this visit.  Specialty Comments:  No specialty comments available.  PMFS History: Patient Active Problem List   Diagnosis Date Noted  . H/O amputation of lesser toe, right (HCC) 06/26/2017  . Acute osteomyelitis of toe, right (HCC)   . Cellulitis 06/14/2017  . Cellulitis of right foot 06/14/2017  . Diabetes mellitus type 2 in obese (HCC) 06/14/2017  . Rib pain on left side 05/30/2017  . Chest wall muscle strain, initial encounter 05/30/2017  . Fracture of humerus, proximal, right, closed 09/03/2016  . Fracture of humeral shaft, right, closed 09/02/2016  . Diabetes (HCC) 09/02/2016  . Essential hypertension, benign 09/02/2016   Past Medical History:  Diagnosis Date  . Diabetes mellitus without complication (HCC)   .  Fracture closed, humerus    right  . Fracture of humeral shaft, right, closed 09/02/2016  . Fracture of humerus, proximal, right, closed 09/03/2016  . Hypertension     Family History  Problem Relation Age of Onset  . Sjogren's syndrome Mother   . CAD Father   . Diabetes Father     Past Surgical History:  Procedure Laterality Date  . AMPUTATION TOE Right 06/16/2017   Procedure: AMPUTATION TOE right foot 2nd toe;  Surgeon: Nadara Mustarduda, Marcus V, MD;  [facility];  Service: Orthopedics;  Laterality: Right;  . CHOLECYSTECTOMY    . ORIF HUMERUS FRACTURE Right 09/02/2016   Procedure: OPEN  REDUCTION INTERNAL FIXATION (ORIF) RIGHT  HUMERAL SHAFT FRACTURE;  Surgeon: Myrene GalasMichael Handy, MD;  [facility];  Service: Orthopedics;  Laterality: Right;  . WISDOM TOOTH EXTRACTION     Social History   Occupational History  . Not on file  Tobacco Use  . Smoking status: Former Smoker    Last attempt to quit: 08/09/2011    Years since quitting: 5.9  . Smokeless tobacco: Never Used  Substance and Sexual Activity  . Alcohol use: Yes    Comment: 3 drinks per week  . Drug use: No  . Sexual activity: Not on file

## 2017-07-10 NOTE — Telephone Encounter (Signed)
Patient was seen in the office today, and questioning about what else he should do for his back. He states he received a call back in November but failed to return the call. He states he is willing to try anything. I told him about trying physical therapy in conjunction with medication, since previous ESI did not work. He is unsure of how much in therapy can be done due to recent toe amputation and having to minimize weightbearing. He would like to know what medication he would take, advised most likely NSAIDS, can you please advise for patient?

## 2017-07-10 NOTE — Telephone Encounter (Signed)
Please advise. Thanks.  

## 2017-07-11 NOTE — Telephone Encounter (Signed)
Long term problem - esi did not help - needs surgical eval with yates - on pain meds from duda pls call thx

## 2017-07-12 MED FILL — TRUEplus LANCETS 28G MISC: 33 days supply | Qty: 100 | Fill #1

## 2017-07-12 MED FILL — GABAPENTIN 300 MG CAPSULE: 300 | 30 days supply | Qty: 60 | Fill #0

## 2017-07-12 NOTE — Telephone Encounter (Signed)
I tried calling patient to discuss. No answer. LMVM for him advising below per Dr August Saucerean.

## 2017-07-20 ENCOUNTER — Ambulatory Visit (INDEPENDENT_AMBULATORY_CARE_PROVIDER_SITE_OTHER): Payer: Self-pay | Admitting: Orthopedic Surgery

## 2017-07-20 ENCOUNTER — Encounter (INDEPENDENT_AMBULATORY_CARE_PROVIDER_SITE_OTHER): Payer: Self-pay | Admitting: Orthopedic Surgery

## 2017-07-20 VITALS — Ht 72.0 in | Wt 303.0 lb

## 2017-07-20 DIAGNOSIS — Z89421 Acquired absence of other right toe(s): Secondary | ICD-10-CM

## 2017-07-20 MED ORDER — MUPIROCIN 2 % EX OINT: 1.0000 "application " | TOPICAL_OINTMENT | Freq: Two times a day (BID) | CUTANEOUS | 3 refills | Status: DC

## 2017-07-20 MED ORDER — OXYCODONE-ACETAMINOPHEN 5-325 MG PO TABS: 1.0000 | ORAL_TABLET | Freq: Two times a day (BID) | ORAL | 0 refills | Status: DC | PRN

## 2017-07-20 NOTE — Addendum Note (Signed)
Addended by: Barnie DelZAMORA, ERIN R on: 07/20/2017 04:02 PM   Modules accepted: Orders

## 2017-07-20 NOTE — Progress Notes (Signed)
Office Visit Note   Patient: Shaun KittenRobert Ayala           Date of Birth: 1965-11-08           MRN: [id number] Visit Date: 07/20/2017              Requested by: Lizbeth BarkHairston, Mandesia R, FNP [address] [place], [place] [address] PCP: Lizbeth BarkHairston, Mandesia R, FNP  Chief Complaint  Patient presents with  . Right Foot - Routine Post Op    06/16/17 right second toe amputation       HPI: Patient is a 51 year old gentleman who presents in follow-up status post right foot second toe amputation.  Patient has been ambulating around the home but not outside the home he uses a cane.  He is on doxycycline he states the incision is open more and he states he is noticed some new ischemic ulcers over the lateral aspect of his foot.  Patient states that he rests with all of his body weight on the lateral side of his foot.  Assessment & Plan: Visit Diagnoses:  1. H/O amputation of lesser toe, right (HCC)     Plan: Recommended placing pillows under his calf to float the foot.  A prescription was called in for Bactroban to apply to the open wound from the second toe amputation recommended minimizing weightbearing elevation no pressure over the lateral aspect of his foot.  He is not to return to work until the  Follow-Up Instructions: Return in about 3 weeks (around 08/10/2017).   Ortho Exam  Patient is alert, oriented, no adenopathy, well-dressed, normal affect, normal respiratory effort. Examination patient has no redness or cellulitis in the foot he has palpable dorsalis pedis pulse there is no cellulitis no drainage.  The fibrinous tissue was debrided from the second toe amputation there is 50% granulation tissue 50% fibrinous tissue with a wound that is 1 cm in diameter and 3 mm deep.  Patient has a superficial ischemic ulcer over the lateral aspect of his foot over the base of the fifth metatarsal which is approximately 2 cm in diameter.  There is no abscess no cyst.  Imaging: No results found. No  images are attached to the encounter.  Labs: Lab Results  Component Value Date   HGBA1C 6.7 12/01/2016   HGBA1C 11.7 (H) 09/02/2016    @LABSALLVALUES (HGBA1)@  Body mass index is 41.09 kg/m.  Orders:  No orders of the defined types were placed in this encounter.  Meds ordered this encounter  Medications  . mupirocin ointment (BACTROBAN) 2 %    Sig: Apply 1 application topically 2 (two) times daily. Apply to the affected area 2 times a day    Dispense:  22 g    Refill:  3     Procedures: No procedures performed  Clinical Data: No additional findings.  ROS:  All other systems negative, except as noted in the HPI. Review of Systems  Objective: Vital Signs: Ht 6' (1.829 m)   Wt (!) 303 lb (137.4 kg)   BMI 41.09 kg/m   Specialty Comments:  No specialty comments available.  PMFS History: Patient Active Problem List   Diagnosis Date Noted  . H/O amputation of lesser toe, right (HCC) 06/26/2017  . Acute osteomyelitis of toe, right (HCC)   . Cellulitis 06/14/2017  . Cellulitis of right foot 06/14/2017  . Diabetes mellitus type 2 in obese (HCC) 06/14/2017  . Rib pain on left side 05/30/2017  . Chest wall muscle strain, initial  encounter 05/30/2017  . Fracture of humerus, proximal, right, closed 09/03/2016  . Fracture of humeral shaft, right, closed 09/02/2016  . Diabetes (HCC) 09/02/2016  . Essential hypertension, benign 09/02/2016   Past Medical History:  Diagnosis Date  . Diabetes mellitus without complication (HCC)   . Fracture closed, humerus    right  . Fracture of humeral shaft, right, closed 09/02/2016  . Fracture of humerus, proximal, right, closed 09/03/2016  . Hypertension     Family History  Problem Relation Age of Onset  . Sjogren's syndrome Mother   . CAD Father   . Diabetes Father     Past Surgical History:  Procedure Laterality Date  . AMPUTATION TOE Right 06/16/2017   Procedure: AMPUTATION TOE right foot 2nd toe;  Surgeon: Nadara Mustarduda, Marcoantonio Legault  V, MD;  [facility];  Service: Orthopedics;  Laterality: Right;  . CHOLECYSTECTOMY    . ORIF HUMERUS FRACTURE Right 09/02/2016   Procedure: OPEN REDUCTION INTERNAL FIXATION (ORIF) RIGHT  HUMERAL SHAFT FRACTURE;  Surgeon: Myrene GalasMichael Handy, MD;  [facility];  Service: Orthopedics;  Laterality: Right;  . WISDOM TOOTH EXTRACTION     Social History   Occupational History  . Not on file  Tobacco Use  . Smoking status: Former Smoker    Last attempt to quit: 08/09/2011    Years since quitting: 5.9  . Smokeless tobacco: Never Used  Substance and Sexual Activity  . Alcohol use: Yes    Comment: 3 drinks per week  . Drug use: No  . Sexual activity: Not on file

## 2017-07-24 MED FILL — TRUEPLUS PEN NDL 32GX5/32": 32G X 4 MM | 30 days supply | Qty: 100 | Fill #0

## 2017-07-24 MED FILL — $LANTUS SOLOSTAR 100 UNITS/: 100 | 37 days supply | Qty: 15 | Fill #0

## 2017-07-24 MED FILL — TRUEPLUS PEN NDL 32GX5/32: 32G X 4 MM | 30 days supply | Qty: 100 | Fill #0

## 2017-07-24 MED FILL — $HUMALOG 100 UNITS/ML KWIKP: 100 | 33 days supply | Qty: 15 | Fill #1

## 2017-07-28 MED FILL — LISINOPRIL 10 MG TABS: 10 | 30 days supply | Qty: 30 | Fill #1

## 2017-08-04 ENCOUNTER — Other Ambulatory Visit (INDEPENDENT_AMBULATORY_CARE_PROVIDER_SITE_OTHER): Payer: Self-pay | Admitting: Radiology

## 2017-08-04 ENCOUNTER — Telehealth (INDEPENDENT_AMBULATORY_CARE_PROVIDER_SITE_OTHER): Payer: Self-pay | Admitting: Orthopedic Surgery

## 2017-08-04 ENCOUNTER — Other Ambulatory Visit (INDEPENDENT_AMBULATORY_CARE_PROVIDER_SITE_OTHER): Payer: Self-pay

## 2017-08-04 MED ORDER — OXYCODONE-ACETAMINOPHEN 5-325 MG PO TABS: ORAL_TABLET | ORAL | 0 refills | Status: DC

## 2017-08-04 MED ORDER — MUPIROCIN 2 % EX OINT: 1.0000 "application " | TOPICAL_OINTMENT | Freq: Two times a day (BID) | CUTANEOUS | 3 refills | Status: DC

## 2017-08-04 MED FILL — TRUE METRIX TEST STRIP: 25 days supply | Qty: 100 | Fill #3

## 2017-08-04 NOTE — Telephone Encounter (Signed)
Patient called having some questions about his antibiotic ointment he's been applying to his wound since surgery, he is almost out of it and was wanting to know if he needed another RX for it. Also wanted a refill on his percocet, and states his wound looks good but there is a lot of bruising on the outer part of the foot and it is pretty painful. [place] in [place]. CB # [phone]

## 2017-08-04 NOTE — Telephone Encounter (Signed)
Ok to refill oxycodone and ointment?

## 2017-08-04 NOTE — Telephone Encounter (Signed)
Yes #30.  1-2 tabs po bid prn pain

## 2017-08-04 NOTE — Addendum Note (Signed)
Addended by: Cherre HugerMAY, Tarica Harl E on: 08/04/2017 11:28 AM   Modules accepted: Orders

## 2017-08-04 NOTE — Telephone Encounter (Signed)
Rx printed

## 2017-08-07 ENCOUNTER — Encounter: Payer: Self-pay | Admitting: Nurse Practitioner

## 2017-08-07 ENCOUNTER — Telehealth: Payer: Self-pay | Admitting: Family Medicine

## 2017-08-07 ENCOUNTER — Ambulatory Visit: Payer: MEDICAID | Attending: Family Medicine | Admitting: Family Medicine

## 2017-08-07 ENCOUNTER — Encounter: Payer: Self-pay | Admitting: Family Medicine

## 2017-08-07 VITALS — BP 116/74 | HR 95 | Temp 99.3°F | Resp 18 | Ht 72.0 in | Wt 290.4 lb

## 2017-08-07 DIAGNOSIS — J309 Allergic rhinitis, unspecified: Secondary | ICD-10-CM

## 2017-08-07 DIAGNOSIS — E669 Obesity, unspecified: Secondary | ICD-10-CM | POA: Insufficient documentation

## 2017-08-07 DIAGNOSIS — Z1211 Encounter for screening for malignant neoplasm of colon: Secondary | ICD-10-CM

## 2017-08-07 DIAGNOSIS — E118 Type 2 diabetes mellitus with unspecified complications: Secondary | ICD-10-CM

## 2017-08-07 DIAGNOSIS — K529 Noninfective gastroenteritis and colitis, unspecified: Secondary | ICD-10-CM

## 2017-08-07 DIAGNOSIS — E1140 Type 2 diabetes mellitus with diabetic neuropathy, unspecified: Secondary | ICD-10-CM

## 2017-08-07 DIAGNOSIS — I10 Essential (primary) hypertension: Secondary | ICD-10-CM

## 2017-08-07 DIAGNOSIS — E1169 Type 2 diabetes mellitus with other specified complication: Secondary | ICD-10-CM

## 2017-08-07 DIAGNOSIS — Z9049 Acquired absence of other specified parts of digestive tract: Secondary | ICD-10-CM

## 2017-08-07 DIAGNOSIS — Z794 Long term (current) use of insulin: Secondary | ICD-10-CM

## 2017-08-07 DIAGNOSIS — Z79899 Other long term (current) drug therapy: Secondary | ICD-10-CM | POA: Insufficient documentation

## 2017-08-07 LAB — POCT GLYCOSYLATED HEMOGLOBIN (HGB A1C): HEMOGLOBIN A1C: 6.5

## 2017-08-07 LAB — GLUCOSE, POCT (MANUAL RESULT ENTRY): POC Glucose: 161 mg/dl — AB (ref 70–99)

## 2017-08-07 MED ORDER — GABAPENTIN 300 MG PO CAPS: 300.0000 mg | ORAL_CAPSULE | Freq: Three times a day (TID) | ORAL | 2 refills | Status: DC

## 2017-08-07 MED ORDER — INSULIN LISPRO 100 UNIT/ML (KWIKPEN): 0.0000 [IU] | PEN_INJECTOR | Freq: Three times a day (TID) | SUBCUTANEOUS | 3 refills | Status: DC

## 2017-08-07 MED ORDER — LOPERAMIDE HCL 2 MG PO TABS: 2.0000 mg | ORAL_TABLET | Freq: Four times a day (QID) | ORAL | 0 refills | Status: DC | PRN

## 2017-08-07 MED ORDER — FLUTICASONE PROPIONATE 50 MCG/ACT NA SUSP: 2.0000 | Freq: Every day | NASAL | 6 refills | Status: DC

## 2017-08-07 MED ORDER — METFORMIN HCL ER 500 MG PO TB24: 1000.0000 mg | ORAL_TABLET | Freq: Two times a day (BID) | ORAL | 3 refills | Status: DC

## 2017-08-07 MED ORDER — INSULIN GLARGINE 100 UNIT/ML SOLOSTAR PEN: 38.0000 [IU] | PEN_INJECTOR | Freq: Every day | SUBCUTANEOUS | 11 refills | Status: AC

## 2017-08-07 MED ORDER — TRUEPLUS LANCETS 28G MISC: 1.0000 | Freq: Once | 12 refills | Status: AC

## 2017-08-07 MED ORDER — LORATADINE 10 MG PO TABS: 10.0000 mg | ORAL_TABLET | Freq: Every day | ORAL | 6 refills | Status: DC

## 2017-08-07 MED ORDER — LISINOPRIL 10 MG PO TABS: 10.0000 mg | ORAL_TABLET | Freq: Every day | ORAL | 1 refills | Status: DC

## 2017-08-07 NOTE — Progress Notes (Signed)
Patient stated is right foot is in pain due to surgery. Patient is here for diabetes and loose diarrhea. Patient stated it may be due to Metformin and he stop taking Metformin. Patient tried to take Metformin again last week and patient stated he notice it hasn't been worst but he said he have sudden feelings where he need to go to the bathroom. Patient stated he taken one Metformin for the past for days and it's not bad. Patient stated his nose is clear, watery from blowing his nose. He thinks it may be allergies. Patient stated he need Lancets and Gabapentin refills. Patient bought in his meter.

## 2017-08-07 NOTE — Progress Notes (Signed)
Subjective:  Patient ID: [name], male    DOB: March 28, 1966  Age: 51 y.o. MRN: [id number]  CC: Diabetes   HPI [name] presents for diabetes follow up:  Patient presents for follow up of diabetes. Symptoms: paresthesia of the feet. Symptoms have been well-controlled. Patient denies foot ulcerations, nausea, visual disturbances and vomitting.  Evaluation to date has been included: fasting blood sugar, hemoglobin A1C and microalbuminuria.  Home sugars: ACHS and occasionally 2 hours post-prandial. CBG running between 78-154. Treatment to date: Continued insulin which has been effective and Continued metformin which has been effective. History of amputation of right 2nd toe r/t cellulitis on 06/16/2017. He reports stiches removed 2 weeks ago. He denies any foul smelling drainage or fevers. He does report mild tenderness at incision. He report upcoming ortho appt.on Thursday with Dr.Duda. Ambulates with assistance of walker/cane. No HH. He c/o chronic loose stools/diarrhea. He denies any bloody stool or unintentional weight loss.  History of cholecystomy several years ago. He reports adherence to lower fat diet. He c/o  of chronic allergic rhinitis. Symptoms include rhinorrhea, nasal congestion and post-nasal drip. He reports not taking anything currently for symptoms.    Outpatient Medications Prior to Visit  Medication Sig Dispense Refill  . doxycycline (VIBRA-TABS) 100 MG tablet Take 1 tablet (100 mg total) by mouth 2 (two) times daily. 60 tablet 0  . ibuprofen (ADVIL,MOTRIN) 400 MG tablet Take 1 tablet (400 mg total) every 6 (six) hours as needed by mouth for moderate pain (foot pain.). 20 tablet 0  . gabapentin (NEURONTIN) 300 MG capsule Take 1 capsule (300 mg total) by mouth 2 (two) times daily. (Patient taking differently: Take 300 mg at bedtime by mouth. ) 60 capsule 2  . Insulin Glargine (LANTUS SOLOSTAR) 100 UNIT/ML Solostar Pen Inject 40 Units into the skin daily at 10 pm. (Patient  taking differently: Inject 38 Units daily at 10 pm into the skin. ) 5 pen 11  . insulin lispro (HUMALOG KWIKPEN) 100 UNIT/ML KiwkPen Inject 0-0.15 mLs (0-15 Units total) into the skin 3 (three) times daily with meals. 45 mL 3  . lisinopril (PRINIVIL,ZESTRIL) 10 MG tablet TAKE 1 TABLET BY MOUTH DAILY. 90 tablet 0  . metFORMIN (GLUCOPHAGE XR) 500 MG 24 hr tablet Take 2 tablets (1,000 mg total) by mouth 2 (two) times daily with a meal. (Patient taking differently: Take 500 mg 2 (two) times daily with a meal by mouth. ) 120 tablet 3  . mupirocin ointment (BACTROBAN) 2 % Apply 1 application topically 2 (two) times daily. Apply to the affected area 2 times a day 22 g 3  . oxyCODONE-acetaminophen (ROXICET) 5-325 MG tablet 1-2 BID prn pain 30 tablet 0  . cyclobenzaprine (FLEXERIL) 10 MG tablet Take 1 tablet (10 mg total) by mouth 2 (two) times daily as needed for muscle spasms. (Patient not taking: Reported on 08/07/2017) 20 tablet 0  . polyethylene glycol (MIRALAX / GLYCOLAX) packet Take 17 g daily by mouth. (Patient not taking: Reported on 08/07/2017) 14 each 0  . insulin aspart (NOVOLOG FLEXPEN) 100 UNIT/ML FlexPen 0-15 Units, Subcutaneous, 3 times daily with meals CBG < 70: implement hypoglycemia protocol-call MD CBG 70 - 120: 0 units CBG 121 - 150: 2 units CBG 151 - 200: 3 units CBG 201 - 250: 5 units CBG 251 - 300: 8 units CBG 301 - 350: 11 units CBG 351 - 400: 15 units CBG > 400: (Patient not taking: Reported on 08/07/2017) 45 mL 3   No  facility-administered medications prior to visit.     ROS Review of Systems  Constitutional: Negative.   HENT: Positive for congestion, postnasal drip and rhinorrhea.   Eyes: Negative.   Respiratory: Negative.   Cardiovascular: Negative.   Gastrointestinal: Positive for diarrhea.  Musculoskeletal: Positive for arthralgias and myalgias.  Skin: Negative.   Neurological: Negative.   Psychiatric/Behavioral: Negative.     Objective:  BP 116/74 (BP  Location: Left Arm, Patient Position: Sitting, Cuff Size: Normal)   Pulse 95   Temp 99.3 F (37.4 C) (Oral)   Resp 18   Ht 6' (1.829 m)   Wt 290 lb 6.4 oz (131.7 kg)   SpO2 97%   BMI 39.39 kg/m   BP/Weight 08/07/2017 07/20/2017 33/54/5625  Systolic BP 638 - -  Diastolic BP 74 - -  Wt. (Lbs) 290.4 303 303  BMI 39.39 41.09 41.09    Physical Exam  Constitutional: He appears well-developed and well-nourished.  obese  HENT:  Head: Normocephalic and atraumatic.  Right Ear: External ear normal.  Left Ear: External ear normal.  Nose: Rhinorrhea present.  Mouth/Throat: Oropharynx is clear and moist.  Eyes: Conjunctivae are normal. Pupils are equal, round, and reactive to light.  Neck: No JVD present.  Cardiovascular: Normal rate, regular rhythm, normal heart sounds and intact distal pulses.  Pulmonary/Chest: Effort normal and breath sounds normal.  Abdominal: Soft. Bowel sounds are normal.  Musculoskeletal: Normal range of motion.       Right foot: There is tenderness (amputation).       Feet:  Skin: Skin is warm and dry.  Psychiatric: He has a normal mood and affect.  Nursing note and vitals reviewed.   Assessment & Plan:   1. Diabetes mellitus type 2 in obese (HCC)  - Glucose (CBG) - HgB A1c - Amb ref to Medical Nutrition Therapy-MNT  2. Type 2 diabetes mellitus with diabetic neuropathy, with long-term current use of insulin (HCC)  - gabapentin (NEURONTIN) 300 MG capsule; Take 1 capsule (300 mg total) by mouth 3 (three) times daily.  Dispense: 90 capsule; Refill: 2  3. Type 2 diabetes mellitus with complication, with long-term current use of insulin (HCC)  - lisinopril (PRINIVIL,ZESTRIL) 10 MG tablet; Take 1 tablet (10 mg total) by mouth daily.  Dispense: 90 tablet; Refill: 1 - TRUEPLUS LANCETS 28G MISC; 1 kit by Does not apply route once for 1 dose.  Dispense: 100 each; Refill: 12 - Ambulatory referral to Ophthalmology - insulin lispro (HUMALOG KWIKPEN) 100 UNIT/ML  KiwkPen; Inject 0-0.15 mLs (0-15 Units total) into the skin 3 (three) times daily with meals. Hold for CBG less than 120 with meals.  Dispense: 45 mL; Refill: 3  4. Essential hypertension, benign  - lisinopril (PRINIVIL,ZESTRIL) 10 MG tablet; Take 1 tablet (10 mg total) by mouth daily.  Dispense: 90 tablet; Refill: 1  5. Chronic allergic rhinitis  - loratadine (CLARITIN) 10 MG tablet; Take 1 tablet (10 mg total) by mouth daily.  Dispense: 30 tablet; Refill: 6 - fluticasone (FLONASE) 50 MCG/ACT nasal spray; Place 2 sprays into both nostrils daily.  Dispense: 16 g; Refill: 6  6. Chronic diarrhea  - loperamide (IMODIUM A-D) 2 MG tablet; Take 1 tablet (2 mg total) by mouth 4 (four) times daily as needed for diarrhea or loose stools.  Dispense: 30 tablet; Refill: 0 - Ambulatory referral to Gastroenterology  7. History of cholecystectomy  - Ambulatory referral to Gastroenterology  8. Screen for colon cancer  - Fecal occult blood, imunochemical  Follow-up: Return in about 3 months (around 11/05/2017) for DM.   Alfonse Spruce FNP

## 2017-08-07 NOTE — Telephone Encounter (Signed)
Called and left message for call back.

## 2017-08-07 NOTE — Patient Instructions (Signed)
Low-Fat Diet for Pancreatitis or Gallbladder Conditions A low-fat diet can be helpful if you have pancreatitis or a gallbladder condition. With these conditions, your pancreas and gallbladder have trouble digesting fats. A healthy eating plan with less fat will help rest your pancreas and gallbladder and reduce your symptoms. What do I need to know about this diet?  Eat a low-fat diet. ? Reduce your fat intake to less than 20-30% of your total daily calories. This is less than 50-60 g of fat per day. ? Remember that you need some fat in your diet. Ask your dietician what your daily goal should be. ? Choose nonfat and low-fat healthy foods. Look for the words "nonfat," "low fat," or "fat free." ? As a guide, look on the label and choose foods with less than 3 g of fat per serving. Eat only one serving.  Avoid alcohol.  Do not smoke. If you need help quitting, talk with your health care provider.  Eat small frequent meals instead of three large heavy meals. What foods can I eat? Grains Include healthy grains and starches such as potatoes, wheat bread, fiber-rich cereal, and brown rice. Choose whole grain options whenever possible. In adults, whole grains should account for 45-65% of your daily calories. Fruits and Vegetables Eat plenty of fruits and vegetables. Fresh fruits and vegetables add fiber to your diet. Meats and Other Protein Sources Eat lean meat such as chicken and pork. Trim any fat off of meat before cooking it. Eggs, fish, and beans are other sources of protein. In adults, these foods should account for 10-35% of your daily calories. Dairy Choose low-fat milk and dairy options. Dairy includes fat and protein, as well as calcium. Fats and Oils Limit high-fat foods such as fried foods, sweets, baked goods, sugary drinks. Other Creamy sauces and condiments, such as mayonnaise, can add extra fat. Think about whether or not you need to use them, or use smaller amounts or low fat  options. What foods are not recommended?  High fat foods, such as: ? Baked goods. ? Ice cream. ? French toast. ? Sweet rolls. ? Pizza. ? Cheese bread. ? Foods covered with batter, butter, creamy sauces, or cheese. ? Fried foods. ? Sugary drinks and desserts.  Foods that cause gas or bloating This information is not intended to replace advice given to you by your health care provider. Make sure you discuss any questions you have with your health care provider. Document Released: 07/30/2013 Document Revised: 12/31/2015 Document Reviewed: 07/08/2013 Elsevier Interactive Patient Education  2017 [employer]  

## 2017-08-09 NOTE — Telephone Encounter (Signed)
Pt. Returned PCP call. Please f/u °

## 2017-08-10 ENCOUNTER — Encounter (INDEPENDENT_AMBULATORY_CARE_PROVIDER_SITE_OTHER): Payer: Self-pay | Admitting: Orthopedic Surgery

## 2017-08-10 ENCOUNTER — Ambulatory Visit (INDEPENDENT_AMBULATORY_CARE_PROVIDER_SITE_OTHER): Payer: Self-pay | Admitting: Orthopedic Surgery

## 2017-08-10 ENCOUNTER — Other Ambulatory Visit: Payer: Self-pay | Admitting: Family Medicine

## 2017-08-10 DIAGNOSIS — Z89421 Acquired absence of other right toe(s): Secondary | ICD-10-CM

## 2017-08-10 DIAGNOSIS — E669 Obesity, unspecified: Secondary | ICD-10-CM

## 2017-08-10 DIAGNOSIS — E1169 Type 2 diabetes mellitus with other specified complication: Secondary | ICD-10-CM

## 2017-08-10 DIAGNOSIS — Z13220 Encounter for screening for lipoid disorders: Secondary | ICD-10-CM

## 2017-08-10 MED ORDER — MUPIROCIN 2 % EX OINT: 1.0000 "application " | TOPICAL_OINTMENT | Freq: Two times a day (BID) | CUTANEOUS | 3 refills | Status: DC

## 2017-08-10 MED ORDER — NITROGLYCERIN 0.2 MG/HR TD PT24: 0.2000 mg | MEDICATED_PATCH | Freq: Every day | TRANSDERMAL | 12 refills | Status: DC

## 2017-08-10 MED ORDER — OXYCODONE-ACETAMINOPHEN 5-325 MG PO TABS: ORAL_TABLET | ORAL | 0 refills | Status: DC

## 2017-08-10 MED FILL — TRUEplus LANCETS 28G MISC: 25 days supply | Qty: 100 | Fill #2

## 2017-08-10 MED FILL — FLUTICASONE PROP 50 MCG SPR: 50 | 30 days supply | Qty: 16 | Fill #0

## 2017-08-10 MED FILL — ?LOPERAMIDE 2 MG CAPSULE: 2 | 7 days supply | Qty: 30 | Fill #0

## 2017-08-10 MED FILL — METFORMIN HCL ER 500 MG TAB: 500 | 30 days supply | Qty: 120 | Fill #3

## 2017-08-10 NOTE — Telephone Encounter (Signed)
Called and spoke with patient. Patient had question regarding insulin usage. Reports he takes 15 units of Humalog TID w/meals since last hospitalization. He checks CBG prior to insulin and 2 hours post meals. Noticed hypoglycemia in the 60's-70's after low carb meals and wants advice regarding this. Recommend holding AC coverage if CBG is less than 120. Recommended also patient eats immediately after administration of insulin.

## 2017-08-10 NOTE — Progress Notes (Signed)
Office Visit Note   Patient: Shaun KittenRobert Broom           Date of Birth: 08-Oct-1965           MRN: [id number] Visit Date: 08/10/2017              Requested by: Lizbeth BarkHairston, Mandesia R, FNP [address] [place], [place] [id number] PCP: Lizbeth BarkHairston, Mandesia R, FNP  Chief Complaint  Patient presents with  . Right Foot - Pain      HPI: Patient is a 52 year old gentleman who presents in follow-up status post right foot second toe amputation.  Patient has been ambulating around the home but not outside the home he uses a cane.  He is on doxycycline. he states the wound is not worse. Has been doing mupirocin dressing changes. Offloading lateral foot. Has started gabapentin prescribed by PCP for neuropathic pain, notices little improvement.  Assessment & Plan: Visit Diagnoses:  1. H/O amputation of lesser toe, right (HCC)   2. Diabetes mellitus type 2 in obese [facility](HCC)     Plan: continue to apply Bactroban to the open wound from the second toe amputation. recommended minimizing weightbearing elevation; no pressure over the lateral aspect of his foot.  He is not to return to work until the amputation has healed.  Follow-Up Instructions: No Follow-up on file.   Ortho Exam  Patient is alert, oriented, no adenopathy, well-dressed, normal affect, normal respiratory effort. Examination patient has no redness or cellulitis in the foot he has palpable dorsalis pedis pulse there is no cellulitis no drainage.  The fibrinous tissue was debrided from the second toe amputation there is 90% granulation tissue. Ulcer is 1 cm in diameter and 1 mm deep.  Patient has a superficial ischemic ulcer over the lateral aspect of his foot over the base of the fifth metatarsal which is approximately 2 cm in diameter.  There is no abscess no cyst.  Imaging: No results found. No images are attached to the encounter.  Labs: Lab Results  Component Value Date   HGBA1C 6.5 08/07/2017   HGBA1C 6.7 12/01/2016   HGBA1C 11.7  (H) 09/02/2016    @LABSALLVALUES (HGBA1)@  There is no height or weight on file to calculate BMI.  Orders:  No orders of the defined types were placed in this encounter.  Meds ordered this encounter  Medications  . oxyCODONE-acetaminophen (ROXICET) 5-325 MG tablet    Sig: 1 tablet BID prn pain    Dispense:  30 tablet    Refill:  0  . nitroGLYCERIN (NITRODUR - DOSED IN MG/24 HR) 0.2 mg/hr patch    Sig: Place 1 patch (0.2 mg total) onto the skin daily.    Dispense:  30 patch    Refill:  12     Procedures: No procedures performed  Clinical Data: No additional findings.  ROS:  All other systems negative, except as noted in the HPI. Review of Systems  Constitutional: Negative for chills and fever.  Cardiovascular: Negative for leg swelling.  Skin: Positive for color change and wound.  Neurological: Positive for numbness. Negative for weakness.    Objective: Vital Signs: There were no vitals taken for this visit.  Specialty Comments:  No specialty comments available.  PMFS History: Patient Active Problem List   Diagnosis Date Noted  . H/O amputation of lesser toe, right (HCC) 06/26/2017  . Acute osteomyelitis of toe, right (HCC)   . Cellulitis 06/14/2017  . Cellulitis of right foot 06/14/2017  . Diabetes mellitus type  2 in obese (HCC) 06/14/2017  . Rib pain on left side 05/30/2017  . Chest wall muscle strain, initial encounter 05/30/2017  . Fracture of humerus, proximal, right, closed 09/03/2016  . Fracture of humeral shaft, right, closed 09/02/2016  . Diabetes (HCC) 09/02/2016  . Essential hypertension, benign 09/02/2016   Past Medical History:  Diagnosis Date  . Diabetes mellitus without complication (HCC)   . Fracture closed, humerus    right  . Fracture of humeral shaft, right, closed 09/02/2016  . Fracture of humerus, proximal, right, closed 09/03/2016  . Hypertension     Family History  Problem Relation Age of Onset  . Sjogren's syndrome Mother   .  CAD Father   . Diabetes Father     Past Surgical History:  Procedure Laterality Date  . AMPUTATION TOE Right 06/16/2017   Procedure: AMPUTATION TOE right foot 2nd toe;  Surgeon: Nadara Mustard, MD;  Location: [facility] OR;  Service: Orthopedics;  Laterality: Right;  . CHOLECYSTECTOMY    . ORIF HUMERUS FRACTURE Right 09/02/2016   Procedure: OPEN REDUCTION INTERNAL FIXATION (ORIF) RIGHT  HUMERAL SHAFT FRACTURE;  Surgeon: Myrene Galas, MD;  Location: [facility] OR;  Service: Orthopedics;  Laterality: Right;  . WISDOM TOOTH EXTRACTION     Social History   Occupational History  . Not on file  Tobacco Use  . Smoking status: Former Smoker    Last attempt to quit: 08/09/2011    Years since quitting: 6.0  . Smokeless tobacco: Never Used  Substance and Sexual Activity  . Alcohol use: Yes    Comment: 3 drinks per week  . Drug use: No  . Sexual activity: Not on file

## 2017-08-11 MED FILL — GABAPENTIN 300 MG CAPSULE: 300 | 30 days supply | Qty: 60 | Fill #1

## 2017-08-11 NOTE — Telephone Encounter (Signed)
Patient called and asked about the allergies and wants to know is there a test that he needed todo. Please fu flonase isn't working

## 2017-08-12 LAB — FECAL OCCULT BLOOD, IMMUNOCHEMICAL: Fecal Occult Bld: NEGATIVE

## 2017-08-14 ENCOUNTER — Ambulatory Visit: Payer: No Typology Code available for payment source | Attending: Family Medicine

## 2017-08-14 DIAGNOSIS — Z13220 Encounter for screening for lipoid disorders: Secondary | ICD-10-CM

## 2017-08-14 MED FILL — TRUEPLUS PEN NDL 32GX5/32: 32G X 4 MM | 30 days supply | Qty: 100 | Fill #1

## 2017-08-14 MED FILL — TRUEPLUS PEN NDL 32GX5/32": 32G X 4 MM | 30 days supply | Qty: 100 | Fill #1

## 2017-08-14 NOTE — Progress Notes (Signed)
Patient here for lab visit only 

## 2017-08-15 LAB — LIPID PANEL
CHOL/HDL RATIO: 4.8 ratio (ref 0.0–5.0)
Cholesterol, Total: 235 mg/dL — ABNORMAL HIGH (ref 100–199)
HDL: 49 mg/dL (ref >39)
LDL CALC: 146 mg/dL — AB (ref 0–99)
TRIGLYCERIDES: 198 mg/dL — AB (ref 0–149)
VLDL Cholesterol Cal: 40 mg/dL (ref 5–40)

## 2017-08-16 ENCOUNTER — Other Ambulatory Visit: Payer: Self-pay | Admitting: Family Medicine

## 2017-08-16 DIAGNOSIS — E782 Mixed hyperlipidemia: Secondary | ICD-10-CM

## 2017-08-16 DIAGNOSIS — J309 Allergic rhinitis, unspecified: Secondary | ICD-10-CM

## 2017-08-16 MED ORDER — MOMETASONE FUROATE 50 MCG/ACT NA SUSP: 2.0000 | Freq: Every day | NASAL | 3 refills | Status: DC

## 2017-08-16 MED ORDER — LEVOCETIRIZINE DIHYDROCHLORIDE 5 MG PO TABS: 5.0000 mg | ORAL_TABLET | Freq: Every evening | ORAL | Status: DC

## 2017-08-16 MED ORDER — ATORVASTATIN CALCIUM 20 MG PO TABS: 20.0000 mg | ORAL_TABLET | Freq: Every day | ORAL | 2 refills | Status: DC

## 2017-08-16 MED FILL — ATORVASTATIN 20 MG TABLET: 20 | 30 days supply | Qty: 30 | Fill #0

## 2017-08-16 MED FILL — MOMETASONE FUROATE 50 MCG S: 50 | 30 days supply | Qty: 17 | Fill #0

## 2017-08-16 NOTE — Telephone Encounter (Signed)
Pt aware of message, verbalized understanding.  Name and DOB verified.

## 2017-08-16 NOTE — Telephone Encounter (Signed)
Recommend over the counter  Xyzal 5 mg tablet po QHS and prescription for mometasone 50 MCG/ACT nasal spray, 2 sprays intranasally QD. D/c Claritin and Flonase. If symptoms fail to improve he can schedule follow up appt. for allergist referral.

## 2017-08-18 ENCOUNTER — Telehealth: Payer: Self-pay | Admitting: *Deleted

## 2017-08-18 ENCOUNTER — Other Ambulatory Visit: Payer: Self-pay | Admitting: Family Medicine

## 2017-08-18 DIAGNOSIS — E118 Type 2 diabetes mellitus with unspecified complications: Secondary | ICD-10-CM

## 2017-08-18 DIAGNOSIS — Z794 Long term (current) use of insulin: Principal | ICD-10-CM

## 2017-08-18 MED ORDER — INSULIN LISPRO 100 UNIT/ML (KWIKPEN): 0.0000 [IU] | PEN_INJECTOR | Freq: Three times a day (TID) | SUBCUTANEOUS | 3 refills | Status: DC

## 2017-08-18 MED ORDER — TRUEPLUS LANCETS 28G MISC: 1.0000 | Freq: Once | 12 refills | Status: AC

## 2017-08-18 MED ORDER — GLUCOSE BLOOD VI STRP: ORAL_STRIP | 12 refills | Status: DC

## 2017-08-18 NOTE — Telephone Encounter (Signed)
Schedule visit with [name] for DM

## 2017-08-18 NOTE — Telephone Encounter (Signed)
Schedule next available appointment with clinical pharmacist for next week to address diabetes. Will refill strips and lancets. Recommend he does not administer insulin if CBG is less than 140. If hypoglycemic (CBG less than 70) recommend 1/2 a cup of regular juice or soda, honey graham crackers, or hard candies.

## 2017-08-18 NOTE — Telephone Encounter (Signed)
Patient verified DOB Patient states he is testing 6 times a day. Patient states he checks his sugar prior to meals, and after meals, in the morning and before bedtime. Patient states he has changed his diet and realizes he has been ranging up to the 130's. Patient states 4/5 times that he has resulted at 121 and given himself the 2 units about 45 mins later he bottoms out in the 50's/ Patient would like a refill on strips and lancets that reflect the 6 times a day need for testing.

## 2017-08-21 ENCOUNTER — Telehealth (INDEPENDENT_AMBULATORY_CARE_PROVIDER_SITE_OTHER): Payer: Self-pay | Admitting: Orthopedic Surgery

## 2017-08-21 ENCOUNTER — Ambulatory Visit: Payer: BLUE CROSS/BLUE SHIELD | Admitting: Nurse Practitioner

## 2017-08-21 ENCOUNTER — Encounter: Payer: Self-pay | Admitting: Nurse Practitioner

## 2017-08-21 ENCOUNTER — Other Ambulatory Visit: Payer: Self-pay | Admitting: *Deleted

## 2017-08-21 ENCOUNTER — Encounter (INDEPENDENT_AMBULATORY_CARE_PROVIDER_SITE_OTHER): Payer: Self-pay

## 2017-08-21 VITALS — BP 124/78 | HR 111 | Ht 72.0 in | Wt 285.0 lb

## 2017-08-21 DIAGNOSIS — R197 Diarrhea, unspecified: Secondary | ICD-10-CM

## 2017-08-21 MED ORDER — MICROLET LANCETS MISC: 1.0000 | Freq: Three times a day (TID) | 12 refills | Status: AC

## 2017-08-21 MED ORDER — CHOLESTYRAMINE 4 G PO PACK: 4.0000 g | PACK | ORAL | 12 refills | Status: DC

## 2017-08-21 MED ORDER — GLUCOSE BLOOD VI STRP: ORAL_STRIP | 12 refills | Status: AC

## 2017-08-21 MED ORDER — CONTOUR NEXT EZ W/DEVICE KIT: 1.0000 | PACK | Freq: Three times a day (TID) | 0 refills | Status: AC

## 2017-08-21 MED FILL — MICROLET LANCETS: 30 days supply | Qty: 100 | Fill #0

## 2017-08-21 MED FILL — CONTOUR NEXT EZ METER: W/DEVICE | 30 days supply | Qty: 1 | Fill #0

## 2017-08-21 MED FILL — CONTOUR NEXT STRIPS: 30 days supply | Qty: 100 | Fill #0

## 2017-08-21 NOTE — Telephone Encounter (Signed)
Patient called in requesting an RX refills for his mupirocin ointment and also his Oxycodone.  Patient uses the [facility] on [address] in [place].  CB#[phone].  Thank you.

## 2017-08-21 NOTE — Progress Notes (Signed)
  Chief Complaint:  Chronic Loose stool  Referring Provider:   Mandesia Harrison , FNP  Reason for referral : diarrhea  ASSESSMENT AND PLAN;   1. 52 year old male with chronic crampy, urgent, postprandial diarrhea since cholecystectomy ~ 15 years ago. Diarrhea worse over last few years. 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 Metformin can certainly be contributing as well but taking a reduced amount of it now. SIBO a consideration. Pancreatic insufficiency possible but less likely.  -Loperamide wasn't helpful he says. Trial of Cholestyramine 2 g BID for possible bile acid malabsorption.  -ROV 3-4 weeks. If no improvement consider colonoscopy with biopsies to rule microscopic colitis -food diary to determine if diarrhea worse with certain foods.   2. Colon cancer screening. Just had a negative FIT.    HPI:    "[name]" is a 52 year-old male with at chronic, nonbloody diarrhea most often occurring postprandially and associated with urgency.   No nocturnal diarrhea. BMs were normal prior to cholecystectomy 15 years ago. First stool of day usually formed. BMs more frequent and urgent over last few years.  He can't correlate  diarrhea with anything he eats or drinks except for alcohol which he does not consume much of.  Wheat nor dairy seem to be problematic. He has been on and off metformin over the last few years and recognizes that metformin exacerbates the diarrhea.  When off of metformin the diarrhea does not totally subside however.  Recently started loperamide 1-2 times a day but no improvement after taking for a week so he stopped it.  He has been on antibiotics for toe infection but again diarrhea has not gotten any worse over the last few months.  If anything the diarrhea has actually improved over the last couple of months because of the pain medication he is taking following toe surgery.   [name] has no other GI complaints.  He recently had a normal FIT for  colon cancer screening.  He has been trying to lose weight and has successfully lost 20 pounds recently.  He would really like to cut back on some of his home medication.    Just had a FIT and it was negative.    Past Medical History:  Diagnosis Date  . Diabetes mellitus without complication (HCC)   . Fracture closed, humerus    right  . Fracture of humeral shaft, right, closed 09/02/2016  . Fracture of humerus, proximal, right, closed 09/03/2016  . Hypertension      Past Surgical History:  Procedure Laterality Date  . AMPUTATION TOE Right 06/16/2017   Procedure: AMPUTATION TOE right foot 2nd toe;  Surgeon: Duda, Marcus V, MD;  Location: [facility] OR;  Service: Orthopedics;  Laterality: Right;  . CHOLECYSTECTOMY    . ORIF HUMERUS FRACTURE Right 09/02/2016   Procedure: OPEN REDUCTION INTERNAL FIXATION (ORIF) RIGHT  HUMERAL SHAFT FRACTURE;  Surgeon: Michael Handy, MD;  Location: [facility] OR;  Service: Orthopedics;  Laterality: Right;  . WISDOM TOOTH EXTRACTION     Family History  Problem Relation Age of Onset  . Sjogren's syndrome Mother   . CAD Father   . Diabetes Father    Social History   Tobacco Use  . Smoking status: Former Smoker    Last attempt to quit: 08/09/2011    Years since quitting: 6.0  . Smokeless tobacco: Never Used  Substance Use Topics  . Alcohol use: Yes    Comment: 3 drinks per week  . Drug use:   No   Current Outpatient Medications  Medication Sig Dispense Refill  . atorvastatin (LIPITOR) 20 MG tablet Take 1 tablet (20 mg total) by mouth daily. 30 tablet 2  . Blood Glucose Monitoring Suppl (CONTOUR NEXT EZ) w/Device KIT 1 each by Does not apply route 3 (three) times daily. 1 kit 0  . gabapentin (NEURONTIN) 300 MG capsule Take 1 capsule (300 mg total) by mouth 3 (three) times daily. 90 capsule 2  . glucose blood (CONTOUR NEXT TEST) test strip Use as instructed 100 each 12  . Insulin Glargine (LANTUS SOLOSTAR) 100 UNIT/ML Solostar Pen Inject 38 Units into the skin daily  at 10 pm. 5 pen 11  . insulin lispro (HUMALOG KWIKPEN) 100 UNIT/ML KiwkPen Inject 0-0.15 mLs (0-15 Units total) into the skin 3 (three) times daily with meals. Hold for CBG less than 140 with meals. 45 mL 3  . levocetirizine (XYZAL) 5 MG tablet Take 1 tablet (5 mg total) by mouth every evening.    . lisinopril (PRINIVIL,ZESTRIL) 10 MG tablet Take 1 tablet (10 mg total) by mouth daily. 90 tablet 1  . loperamide (IMODIUM A-D) 2 MG tablet Take 1 tablet (2 mg total) by mouth 4 (four) times daily as needed for diarrhea or loose stools. 30 tablet 0  . metFORMIN (GLUCOPHAGE XR) 500 MG 24 hr tablet Take 2 tablets (1,000 mg total) by mouth 2 (two) times daily with a meal. 120 tablet 3  . MICROLET LANCETS MISC 1 each by Does not apply route 3 (three) times daily. 100 each 12  . mupirocin ointment (BACTROBAN) 2 % Apply 1 application topically 2 (two) times daily. Apply to the affected area 2 times a day 22 g 3  . nitroGLYCERIN (NITRODUR - DOSED IN MG/24 HR) 0.2 mg/hr patch Place 1 patch (0.2 mg total) onto the skin daily. 30 patch 12  . oxyCODONE-acetaminophen (ROXICET) 5-325 MG tablet 1 tablet BID prn pain 30 tablet 0   No current facility-administered medications for this visit.    Allergies  Allergen Reactions  . No Known Allergies      Review of Systems: All systems reviewed and negative except where noted in HPI.     Physical Exam:    BP 124/78   Pulse (!) 111   Ht 6' (1.829 m)   Wt 285 lb (129.3 kg)   SpO2 98%   BMI 38.65 kg/m  Constitutional:  Morbidly obese white male in no acute distress. Psychiatric: Normal mood and affect. Behavior is normal. EENT: Pupils normal.  Conjunctivae are normal. No scleral icterus. Neck supple.  Cardiovascular: Normal rate, regular rhythm. No edema Pulmonary/chest: Effort normal and breath sounds normal. No wheezing, rales or rhonchi. Abdominal: Soft, nondistended. Nontender. Bowel sounds active throughout. There are no masses palpable. No  hepatomegaly. Lymphadenopathy: No cervical adenopathy noted. Neurological: Alert and oriented to person place and time. Skin: Skin is warm and dry. No rashes noted.  Paula Guenther, NP  08/21/2017, 9:28 AM  Cc: [name], Mandesia R, F*  

## 2017-08-21 NOTE — Patient Instructions (Addendum)
If you are age 51 or older, your body mass index should be between 23-30. Your Body mass index is 38.65 kg/m. If this is out of the aforementioned range listed, please consider follow up with your Primary Care Provider.  If you are age 51 or younger, your body mass index should be between 19-25. Your Body mass index is 38.65 kg/m. If this is out of the aformentioned range listed, please consider follow up with your Primary Care Provider.   We have sent the following medications to your pharmacy for you to pick up at your convenience: Cholestyramine  Follow up with Willette ClusterPaula Guenther, NP in 4-6 weeks.  We will contact you regarding an appointment when the schedule becomes available.  Thank you for choosing me and [facility].   Willette ClusterPaula Guenther, NP

## 2017-08-21 NOTE — Telephone Encounter (Signed)
Resent with insurance preferred DM supplies.

## 2017-08-21 NOTE — Telephone Encounter (Signed)
Pt is s/p a right second toe amputation 06/16/17. He is requesting refill of percocet 5/325. Has had #110 in the past month. Last refill 08/10/17 #30 also requesting refill on mupirocin can you please enter into module.

## 2017-08-21 NOTE — Progress Notes (Signed)
Nurse practitioner assessment and plans reviewed 

## 2017-08-21 NOTE — Telephone Encounter (Signed)
Called pt. And LVM stating to call back and schedule an appt. With FrankStacey. When pt. Calls back schedule first available appt.

## 2017-08-22 ENCOUNTER — Other Ambulatory Visit (INDEPENDENT_AMBULATORY_CARE_PROVIDER_SITE_OTHER): Payer: Self-pay

## 2017-08-22 MED ORDER — MUPIROCIN 2 % EX OINT: 1.0000 "application " | TOPICAL_OINTMENT | Freq: Two times a day (BID) | CUTANEOUS | 3 refills | Status: DC

## 2017-08-22 NOTE — Telephone Encounter (Signed)
No refill

## 2017-08-22 NOTE — Telephone Encounter (Signed)
I called pt to advise that ointment has been faxed to pharm and that narcotic pain medication can not be refilled at this time. Pt voiced understanding and will call with questions.

## 2017-08-24 ENCOUNTER — Ambulatory Visit: Payer: No Typology Code available for payment source | Admitting: Pharmacist

## 2017-08-30 ENCOUNTER — Encounter: Payer: Self-pay | Admitting: Registered"

## 2017-08-30 ENCOUNTER — Ambulatory Visit: Payer: No Typology Code available for payment source | Attending: Family Medicine | Admitting: Pharmacist

## 2017-08-30 ENCOUNTER — Encounter: Payer: Self-pay | Admitting: Pharmacist

## 2017-08-30 ENCOUNTER — Encounter: Payer: BLUE CROSS/BLUE SHIELD | Attending: Family Medicine | Admitting: Registered"

## 2017-08-30 DIAGNOSIS — E1169 Type 2 diabetes mellitus with other specified complication: Secondary | ICD-10-CM | POA: Diagnosis not present

## 2017-08-30 DIAGNOSIS — E118 Type 2 diabetes mellitus with unspecified complications: Secondary | ICD-10-CM

## 2017-08-30 DIAGNOSIS — E669 Obesity, unspecified: Secondary | ICD-10-CM | POA: Insufficient documentation

## 2017-08-30 DIAGNOSIS — Z713 Dietary counseling and surveillance: Secondary | ICD-10-CM | POA: Diagnosis not present

## 2017-08-30 DIAGNOSIS — Z794 Long term (current) use of insulin: Secondary | ICD-10-CM

## 2017-08-30 DIAGNOSIS — E11649 Type 2 diabetes mellitus with hypoglycemia without coma: Secondary | ICD-10-CM | POA: Insufficient documentation

## 2017-08-30 MED ORDER — INSULIN LISPRO 100 UNIT/ML (KWIKPEN): 10.0000 [IU] | PEN_INJECTOR | Freq: Three times a day (TID) | SUBCUTANEOUS | 3 refills | Status: DC

## 2017-08-30 NOTE — Patient Instructions (Addendum)
Thanks for coming to see us  Decrease Humalog to 10 units and see if that helps  Do not take the Humalog if your blood sugar is less than 140  Come back and see me in 2 weeks.

## 2017-08-30 NOTE — Progress Notes (Signed)
    S:     No chief complaint on file.   Patient arrives in good spirits.  Presents for diabetes evaluation, education, and management.  Patient reports adherence with medications.   Current diabetes medications include: Humalog 15 units (I think this was intended to be sliding scale with the 0-15 but he was told by the hospital that it is 15 units or nothing so this has been what patient has been doing), Lantus 38 units daily, and metformin XL 1000 mg BID.  Patient reports hypoglycemic events. He reports that his blood sugar would be in the 50s or 60s if he told the Humalog after eating certain meals. He did not take the Humalog if he skipped a meal.  Trying to avoid carbs and is eating more vegetables. Has blood sugars that are elevated when he eats carbs.   O:  Physical Exam   ROS   Lab Results  Component Value Date   HGBA1C 6.5 08/07/2017   There were no vitals filed for this visit.  Home fasting CBG: 80s-120s 2 hour post-prandial/random CBG: 60s-202   A/P: Diabetes longstanding currently controlled based on A1c of 6.5. Patient reports hypoglycemic events and is able to verbalize appropriate hypoglycemia management plan. Patient reports adherence with medication. Control is suboptimal due to episodes of hypoglycemia.  Continue metformin and Lantus. Decrease Humalog to 10 units before meals, unless he skips a meal or eats low carb, then hold the Humalog. He will not take the Humalog if his blood sugar is <140. Patient verbalized understanding. May need an adjustment to this so patient will follow up in 2 weeks with me and call if he has any further hypoglycemia or if his blood sugars are consistently >200.  Next A1C anticipated March 2019.    Written patient instructions provided.  Total time in face to face counseling 15 minutes.   Follow up with PCP in 2 months.   Patient seen with Enid DerryVictoria Mitchell, PharmD Candidate

## 2017-08-30 NOTE — Patient Instructions (Addendum)
Get with your MD regarding timing of insulin and the starting BG of 120 mg/dL vs 161140 mg/dL Also ask if she is okay with you just stop taking metformin due to GI issue Continue with your plan to start more exercise, can help with insulin resistance Consider increasing your eating window time from 8 hours to 10 or 12 hours. Read through hypogylcemia for tips how to recognize and treat.

## 2017-08-30 NOTE — Progress Notes (Signed)
Diabetes Self-Management Education  Visit Type: First/Initial  Appt. Start Time: 0900 Appt. End Time: 1030  08/31/2017  [name], identified by name and date of birth, is a 52 y.o. male with a diagnosis of Diabetes: Type 2.   ASSESSMENT Patient states he has made changes to his diet and is planning to become more active after he gets the clearance from his podiatrist (s/p surgery). Patient states his A1c has been as high as 11.7% and currently is 6.5%. Patient states he does not eat his comfort foods high in carbs that include mac&cheese with hot dogs, rice, and milk. Patient restricts his eating to a 8 hour window after reading benefits from fasting. Patient states he usually skips breakfast because he is not that hungry in the morning and it is harder to not eat in the evenings.  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 Patient states he likes fish, but doesn't eat weekly, because fresh, good fish is expensive. Patient states he enjoys nuts and most vegetables.  Energy level: patient states he doesn't sleep well, diagnosed with sleep apnea. Patient is waiting to see if insurance will cover CPAP, if not he still plans to get it even though he doesn't like the idea of it. Patient reports energy level hasn't changed since starting low carb diet 2 months ago.  Diabetes Self-Management Education - 08/30/17 0926      Visit Information   Visit Type  First/Initial      Initial Visit   Diabetes Type  Type 2    Are you currently following a meal plan?  Yes    What type of meal plan do you follow?  8 hr window of eating, low carb    Are you taking your medications as prescribed?  Yes    Date Diagnosed  2010      Health Coping   How would you rate your overall health?  Fair      Psychosocial Assessment   Patient Belief/Attitude about Diabetes  Motivated to manage diabetes    How often do you need to have someone help you when you read instructions, pamphlets, or other written  materials from your doctor or pharmacy?  1 - Never    What is the last grade level you completed in school?  college      Complications   Last HgB A1C per patient/outside source  6.5 % per patient    How often do you check your blood sugar?  3-4 times/day    Fasting Blood glucose range (mg/dL)  70-129;130-179 80 - 140    Postprandial Blood glucose range (mg/dL)  -- 110-140 recent    Number of hypoglycemic episodes per month  4    Can you tell when your blood sugar is low?  Yes    What do you do if your blood sugar is low?  checked BG, drank juice and had a few bites of pasta waited 30 min and checked again    Number of hyperglycemic episodes per week  1    Can you tell when your blood sugar is high?  No    Have you had a dilated eye exam in the past 12 months?  No has a referral    Have you had a dental exam in the past 12 months?  No    Are you checking your feet?  Yes    How many days per week are you checking your feet?  7  Dietary Intake   Lunch  cabbage, turnips OR roasted Brussel Sprouts roasted chicken OR beef stew 2pm    Snack (afternoon)  none OR nuts OR meat & cheese    Dinner  same as lunch OR bojagles about 1x week    Beverage(s)  water, diet mt dew 20 oz, occassional coffee      Exercise   Exercise Type  Light (walking / raking leaves)    How many days per week to you exercise?  0    How many minutes per day do you exercise?  0    Total minutes per week of exercise  0      Patient Education   Previous Diabetes Education  No    Nutrition management   Role of diet in the treatment of diabetes and the relationship between the three main macronutrients and blood glucose level    Physical activity and exercise   Role of exercise on diabetes management, blood pressure control and cardiac health.    Medications  Reviewed patients medication for diabetes, action, purpose, timing of dose and side effects.    Monitoring  Identified appropriate SMBG and/or A1C goals.     Acute complications  Taught treatment of hypoglycemia - the 15 rule.    Chronic complications  Assessed and discussed foot care and prevention of foot problems      Individualized Goals (developed by patient)   Nutrition  General guidelines for healthy choices and portions discussed    Physical Activity  Exercise 3-5 times per week    Medications  Other (comment) clarify with doctor, timing & decision criteria to take insulin    Monitoring   test my blood glucose as discussed      Outcomes   Expected Outcomes  Demonstrated interest in learning. Expect positive outcomes    Future DMSE  4-6 wks    Program Status  Not Completed     Individualized Plan for Diabetes Self-Management Training:   Learning Objective:  Patient will have a greater understanding of diabetes self-management. Patient education plan is to attend individual and/or group sessions per assessed needs and concerns.  Patient Instructions  Get with your MD regarding timing of insulin and the starting BG of 120 mg/dL vs 140 mg/dL Also ask if she is okay with you just stop taking metformin due to GI issue Continue with your plan to start more exercise, can help with insulin resistance Consider increasing your eating window time from 8 hours to 10 or 12 hours. Read through hypogylcemia for tips how to recognize and treat.  Expected Outcomes:  Demonstrated interest in learning. Expect positive outcomes  Education material provided: Morning High Blood Sugar explained, fish high in omega 3  If problems or questions, patient to contact team via:  Phone and MyChart  Future DSME appointment: 4-6 wks

## 2017-08-31 ENCOUNTER — Ambulatory Visit (INDEPENDENT_AMBULATORY_CARE_PROVIDER_SITE_OTHER): Payer: Self-pay | Admitting: Orthopedic Surgery

## 2017-09-01 DIAGNOSIS — E118 Type 2 diabetes mellitus with unspecified complications: Secondary | ICD-10-CM | POA: Insufficient documentation

## 2017-09-06 ENCOUNTER — Ambulatory Visit (INDEPENDENT_AMBULATORY_CARE_PROVIDER_SITE_OTHER): Payer: Self-pay | Admitting: Family

## 2017-09-13 ENCOUNTER — Ambulatory Visit: Payer: BLUE CROSS/BLUE SHIELD | Admitting: Pharmacist

## 2017-09-18 MED FILL — GABAPENTIN 300 MG CAPSULE: 300 | 30 days supply | Qty: 60 | Fill #2

## 2017-09-18 MED FILL — LISINOPRIL 10 MG TABS: 10 | 30 days supply | Qty: 30 | Fill #0

## 2017-09-18 MED FILL — ATORVASTATIN 20 MG TABLET: 20 | 30 days supply | Qty: 30 | Fill #1

## 2017-09-18 MED FILL — LANTUS SOLOSTAR 100 UNITS/M: 100 | 31 days supply | Qty: 12 | Fill #0

## 2017-10-03 ENCOUNTER — Encounter: Payer: Self-pay | Admitting: Internal Medicine

## 2017-10-09 ENCOUNTER — Encounter (INDEPENDENT_AMBULATORY_CARE_PROVIDER_SITE_OTHER): Payer: Self-pay | Admitting: Orthopedic Surgery

## 2017-10-09 ENCOUNTER — Ambulatory Visit (INDEPENDENT_AMBULATORY_CARE_PROVIDER_SITE_OTHER): Payer: BLUE CROSS/BLUE SHIELD | Admitting: Orthopedic Surgery

## 2017-10-09 VITALS — Ht 72.0 in | Wt 285.0 lb

## 2017-10-09 DIAGNOSIS — E669 Obesity, unspecified: Secondary | ICD-10-CM | POA: Diagnosis not present

## 2017-10-09 DIAGNOSIS — E1169 Type 2 diabetes mellitus with other specified complication: Secondary | ICD-10-CM

## 2017-10-09 DIAGNOSIS — Z89421 Acquired absence of other right toe(s): Secondary | ICD-10-CM

## 2017-10-09 NOTE — Progress Notes (Signed)
Office Visit Note   Patient: [name]           Date of Birth: 1966/03/17           MRN: [id number] Visit Date: 10/09/2017              Requested by: Lizbeth BarkHairston, Mandesia R, FNP No address on file PCP: Lizbeth BarkHairston, Mandesia R, FNP  Chief Complaint  Patient presents with  . Right Foot - Follow-up, Wound Check    06/16/17 right 2nd toe amputation      HPI: Patient presents 4 months status post right foot second toe amputation.  Patient states that he has not had any drainage he has been using Bactroban ointment dressing changes he has stiff soled shoes.  Assessment & Plan: Visit Diagnoses:  1. H/O amputation of lesser toe, right (HCC)   2. Diabetes mellitus type 2 in obese [facility](HCC)     Plan: Recommended Achilles stretching this was demonstrated to him increase his activities begin cardio training.  Follow-Up Instructions: Return if symptoms worsen or fail to improve.   Ortho Exam  Patient is alert, oriented, no adenopathy, well-dressed, normal affect, normal respiratory effort. Examination patient has good hair growth down to his toes.  There is no redness no cellulitis the wound has completely healed.  There is no drainage.  Imaging: No results found. No images are attached to the encounter.  Labs: Lab Results  Component Value Date   HGBA1C 6.5 08/07/2017   HGBA1C 6.7 12/01/2016   HGBA1C 11.7 (H) 09/02/2016    @LABSALLVALUES (HGBA1)@  Body mass index is 38.65 kg/m.  Orders:  No orders of the defined types were placed in this encounter.  No orders of the defined types were placed in this encounter.    Procedures: No procedures performed  Clinical Data: No additional findings.  ROS:  All other systems negative, except as noted in the HPI. Review of Systems  Objective: Vital Signs: Ht 6' (1.829 m)   Wt 285 lb (129.3 kg)   BMI 38.65 kg/m   Specialty Comments:  No specialty comments available.  PMFS History: Patient Active Problem List   Diagnosis Date Noted  . Type 2 diabetes mellitus with complication (HCC) 09/01/2017  . H/O amputation of lesser toe, right (HCC) 06/26/2017  . Acute osteomyelitis of toe, right (HCC)   . Cellulitis 06/14/2017  . Cellulitis of right foot 06/14/2017  . Diabetes mellitus type 2 in obese (HCC) 06/14/2017  . Rib pain on left side 05/30/2017  . Chest wall muscle strain, initial encounter 05/30/2017  . Fracture of humerus, proximal, right, closed 09/03/2016  . Fracture of humeral shaft, right, closed 09/02/2016  . Diabetes (HCC) 09/02/2016  . Essential hypertension, benign 09/02/2016   Past Medical History:  Diagnosis Date  . Diabetes mellitus without complication (HCC)   . Fracture closed, humerus    right  . Fracture of humeral shaft, right, closed 09/02/2016  . Fracture of humerus, proximal, right, closed 09/03/2016  . Hypertension     Family History  Problem Relation Age of Onset  . Sjogren's syndrome Mother   . CAD Father   . Diabetes Father     Past Surgical History:  Procedure Laterality Date  . AMPUTATION TOE Right 06/16/2017   Procedure: AMPUTATION TOE right foot 2nd toe;  Surgeon: Nadara Mustarduda, Marcus V, MD;  [facility];  Service: Orthopedics;  Laterality: Right;  . CHOLECYSTECTOMY    . ORIF HUMERUS FRACTURE Right 09/02/2016   Procedure: OPEN REDUCTION INTERNAL  FIXATION (ORIF) RIGHT  HUMERAL SHAFT FRACTURE;  Surgeon: Myrene Galas, MD;  Location: [facility] OR;  Service: Orthopedics;  Laterality: Right;  . WISDOM TOOTH EXTRACTION     Social History   Occupational History  . Not on file  Tobacco Use  . Smoking status: Former Smoker    Last attempt to quit: 08/09/2011    Years since quitting: 6.1  . Smokeless tobacco: Never Used  Substance and Sexual Activity  . Alcohol use: Yes    Comment: 3 drinks per week  . Drug use: No  . Sexual activity: Not on file

## 2017-10-11 ENCOUNTER — Encounter: Payer: BLUE CROSS/BLUE SHIELD | Attending: Family Medicine | Admitting: Registered"

## 2017-10-11 DIAGNOSIS — Z713 Dietary counseling and surveillance: Secondary | ICD-10-CM | POA: Insufficient documentation

## 2017-10-11 DIAGNOSIS — E669 Obesity, unspecified: Secondary | ICD-10-CM | POA: Diagnosis not present

## 2017-10-11 DIAGNOSIS — E1169 Type 2 diabetes mellitus with other specified complication: Secondary | ICD-10-CM | POA: Insufficient documentation

## 2017-10-11 DIAGNOSIS — E118 Type 2 diabetes mellitus with unspecified complications: Secondary | ICD-10-CM

## 2017-10-11 NOTE — Patient Instructions (Addendum)
Consider doing your exercise after eating instead of before to avoid hypoglycemia. Consider carrying with you some quick carbs with you when exercising. Continue having fish and walnuts on a regular basis to get omega 3 in diet. Continue to investigate your relationship food.

## 2017-10-11 NOTE — Progress Notes (Signed)
Diabetes Self-Management Education  Visit Type: Follow-up  Appt. Start Time: 0905 Appt. End Time: 0940  10/11/2017  [name], identified by name and date of birth, is a 52 y.o. male with a diagnosis of Diabetes:  .   ASSESSMENT Pt states he is very motivated to have goals and check off boxes. Pt states he is very excited to be walking since getting the clearance after healing from foot surgery and enjoys walking outside "roadwork" Pt states it has been 3 months since he has been able to walk much and has missed having movement.   Pt states he has talked with doctor about adjusting insulin dose to avoid hypoglycemia. Pt states when he plans to eat smaller meals with minimal carbs he doesn't need insulin. Patient states he is very carb sensitive and a small potato will shoot his BG up. Patient states after last visit he stopped taking metformin due to GI issues. Pt states since then he started taking 500 mg with his largest meal (lunch) and slowly increased and now usually takes about 1500 mg through out the day and feels almost ready to add the final 500 mg to his daily routine and may try the original prescribed way of taking 1,000 mg with breakfast and dinner.   Pt states he has started eating a variety first at least weekly and recently included salmon, mountain trout, shrimp and crab. Pt had questions about omega 3 & 6. RD discussed some basic inflammation body response and role in health.   Pt states he is getting to understand his relationship with food and investigating why he has the occasional urge to over eat or have sugary foods.  Diabetes Self-Management Education - 10/11/17 0940      Visit Information   Visit Type  Follow-up      Initial Visit   What type of meal plan do you follow?  12 hr or less window of eating, low carb    Are you taking your medications as prescribed?  Yes      Complications   How often do you check your blood sugar?  3-4 times/day    Fasting  Blood glucose range (mg/dL)  16-109;604-540    Postprandial Blood glucose range (mg/dL)  981-191      Exercise   Exercise Type  Light (walking / raking leaves) just started walking this week      Patient Education   Physical activity and exercise   Identified with patient nutritional and/or medication changes necessary with exercise.      Outcomes   Expected Outcomes  Demonstrated interest in learning. Expect positive outcomes    Future DMSE  4-6 wks    Program Status  Not Completed      Subsequent Visit   Since your last visit have you continued or begun to take your medications as prescribed?  Yes    Since your last visit have you had your blood pressure checked?  No    Since your last visit have you experienced any weight changes?  No change    Since your last visit, are you checking your blood glucose at least once a day?  Yes     Individualized Plan for Diabetes Self-Management Training:   Learning Objective:  Patient will have a greater understanding of diabetes self-management. Patient education plan is to attend individual and/or group sessions per assessed needs and concerns.   Patient Instructions  Consider doing your exercise after eating instead of before to avoid  hypoglycemia. Consider carrying with you some quick carbs with you when exercising. Continue having fish and walnuts on a regular basis to get omega 3 in diet. Continue to investigate your relationship food.  Expected Outcomes:  Demonstrated interest in learning. Expect positive outcomes  Education material provided: none  If problems or questions, patient to contact team via:  Phone  Future DSME appointment: 4-6 wks

## 2017-10-20 MED FILL — GABAPENTIN 300 MG CAPSULE: 300 | 30 days supply | Qty: 90 | Fill #0

## 2017-10-23 ENCOUNTER — Other Ambulatory Visit: Payer: Self-pay | Admitting: Pharmacist

## 2017-10-23 MED ORDER — INSULIN ASPART 100 UNIT/ML FLEXPEN: 0.0000 [IU] | PEN_INJECTOR | Freq: Three times a day (TID) | SUBCUTANEOUS | 0 refills | Status: DC

## 2017-10-26 MED FILL — ATORVASTATIN 20 MG TABLET: 20 | 30 days supply | Qty: 30 | Fill #2

## 2017-10-26 MED FILL — LISINOPRIL 10 MG TABS: 10 | 30 days supply | Qty: 30 | Fill #1

## 2017-10-27 MED FILL — TRUEPLUS PEN NDL 32GX5/32: 32G X 4 MM | 30 days supply | Qty: 100 | Fill #2

## 2017-10-27 MED FILL — LANTUS SOLOSTAR 100 UNITS/M: 100 | 31 days supply | Qty: 12 | Fill #1

## 2017-10-27 MED FILL — TRUEPLUS PEN NDL 32GX5/32": 32G X 4 MM | 30 days supply | Qty: 100 | Fill #2

## 2017-10-27 MED FILL — NOVOLOG FLEXPEN SYRINGE: 100 | 30 days supply | Qty: 15 | Fill #0

## 2017-11-06 ENCOUNTER — Ambulatory Visit: Payer: Self-pay | Admitting: Nurse Practitioner

## 2017-11-06 ENCOUNTER — Ambulatory Visit: Payer: Self-pay | Admitting: Family Medicine

## 2017-11-15 IMAGING — DX DG HUMERUS 2V *R*
2 series · 2 of 2 positions shown · non-contrast
Comparison: No recent .

CLINICAL DATA: ORIF.

EXAM:
RIGHT HUMERUS - 2+ VIEW

[humerus ap]
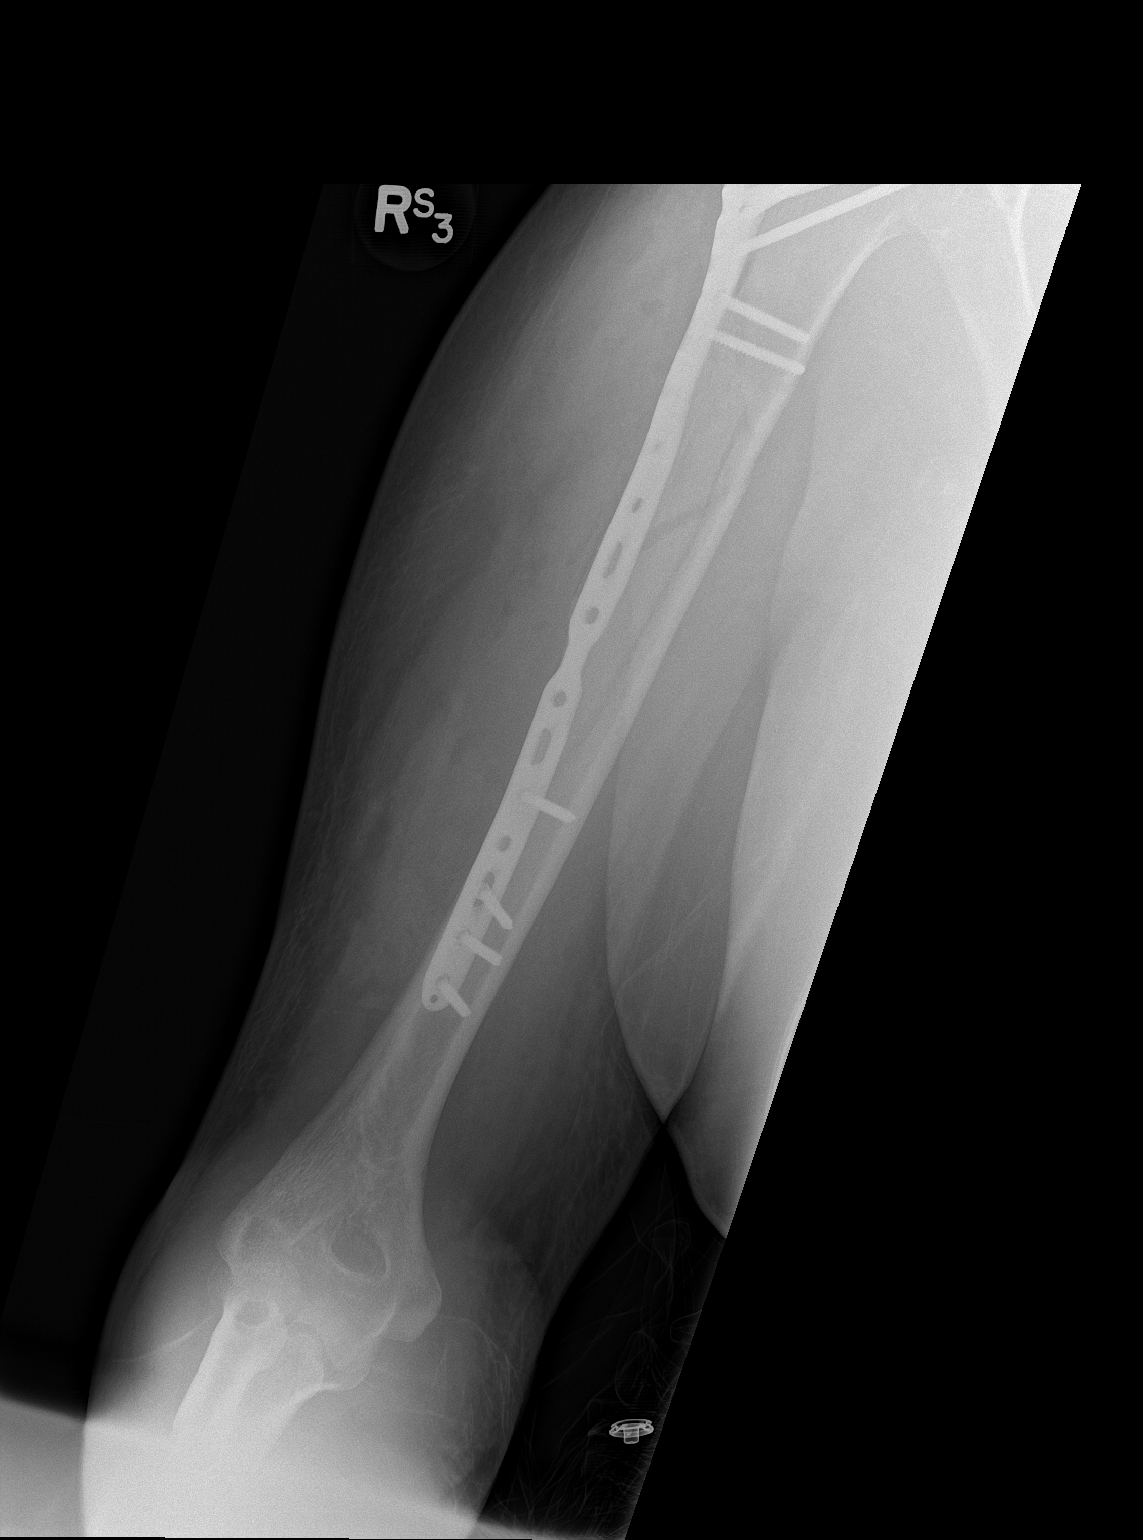

[humerus lat]
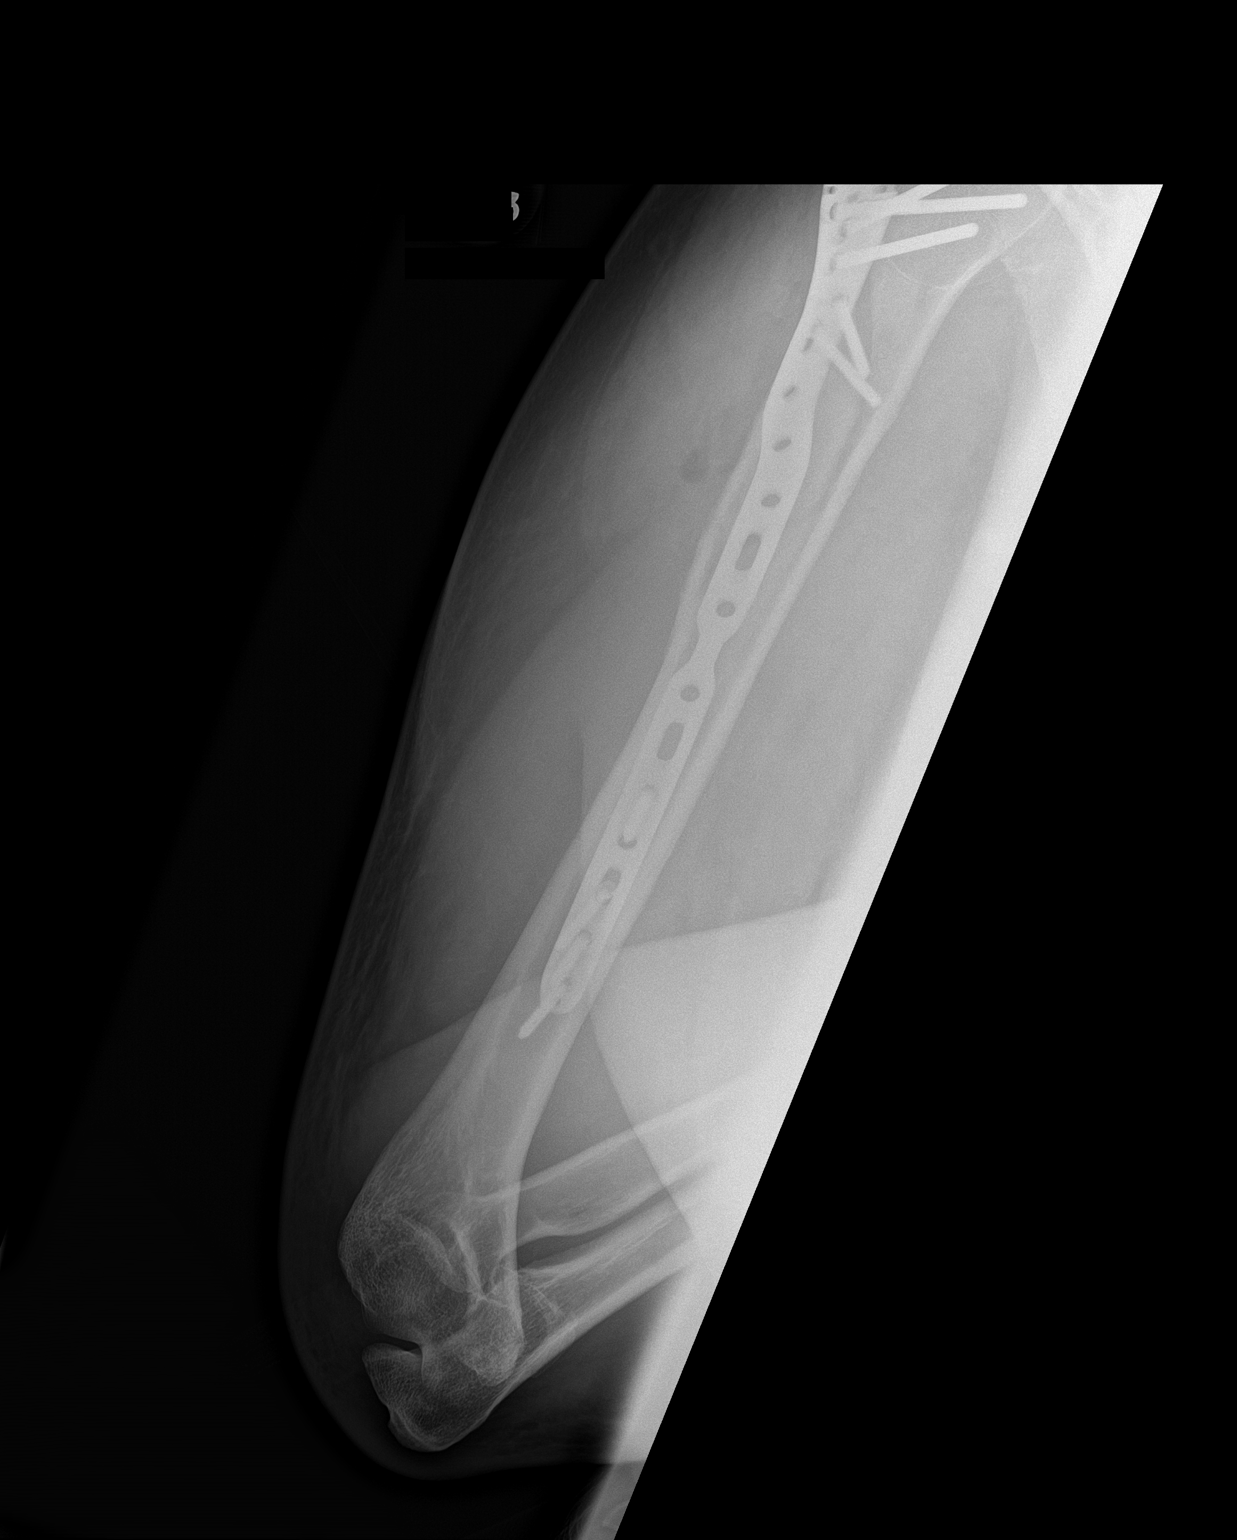

[2 of 2 positions shown; findings below may reference images not displayed]

FINDINGS: Plate and screw fixation of the right humeral fractures noted.
Anatomic alignment. Hardware intact. Postsurgical changes in the
soft tissues.
IMPRESSION: Plate and screw fixation of the right humeral fractures noted.
Anatomic alignment. Hardware intact .

## 2017-11-22 ENCOUNTER — Ambulatory Visit: Payer: BLUE CROSS/BLUE SHIELD | Admitting: Registered"

## 2017-12-01 MED FILL — GABAPENTIN 300 MG CAPSULE: 300 | 30 days supply | Qty: 90 | Fill #1

## 2017-12-01 MED FILL — NOVOLOG FLEXPEN SYRINGE: 100 | 30 days supply | Qty: 15 | Fill #1

## 2017-12-01 MED FILL — TRUEPLUS PEN NDL 32GX5/32: 32G X 4 MM | 30 days supply | Qty: 100 | Fill #3

## 2017-12-01 MED FILL — CONTOUR NEXT STRIPS: 30 days supply | Qty: 100 | Fill #1

## 2017-12-01 MED FILL — LANTUS SOLOSTAR 100 UNITS/M: 100 | 31 days supply | Qty: 12 | Fill #2

## 2017-12-01 MED FILL — TRUEPLUS PEN NDL 32GX5/32": 32G X 4 MM | 30 days supply | Qty: 100 | Fill #3

## 2017-12-01 MED FILL — LISINOPRIL 10 MG TABS: 10 | 30 days supply | Qty: 30 | Fill #2

## 2017-12-01 MED FILL — MICROLET LANCETS MISC: 30 days supply | Qty: 100 | Fill #1

## 2018-01-08 MED FILL — LISINOPRIL 10 MG TABS: 10 | 30 days supply | Qty: 30 | Fill #3

## 2018-01-08 MED FILL — LANTUS SOLOSTAR 100 UNITS/M: 100 | 31 days supply | Qty: 12 | Fill #3

## 2018-01-08 MED FILL — GABAPENTIN 300 MG CAPSULE: 300 | 30 days supply | Qty: 90 | Fill #2

## 2018-01-31 ENCOUNTER — Ambulatory Visit: Payer: BLUE CROSS/BLUE SHIELD | Attending: Nurse Practitioner | Admitting: Nurse Practitioner

## 2018-02-02 ENCOUNTER — Ambulatory Visit (HOSPITAL_COMMUNITY)
Admission: RE | Admit: 2018-02-02 | Discharge: 2018-02-02 | Disposition: A | Discharge status: Home / Self Care | Payer: BLUE CROSS/BLUE SHIELD | Source: Ambulatory Visit | Attending: Nurse Practitioner | Admitting: Nurse Practitioner

## 2018-02-02 ENCOUNTER — Emergency Department (HOSPITAL_COMMUNITY)
Admission: EM | Admit: 2018-02-02 | Discharge: 2018-02-02 | Disposition: A | Discharge status: Home / Self Care | Payer: BLUE CROSS/BLUE SHIELD

## 2018-02-02 ENCOUNTER — Encounter: Payer: Self-pay | Admitting: Nurse Practitioner

## 2018-02-02 ENCOUNTER — Ambulatory Visit: Payer: BLUE CROSS/BLUE SHIELD | Attending: Nurse Practitioner | Admitting: Nurse Practitioner

## 2018-02-02 VITALS — BP 112/72 | HR 104 | Temp 99.0°F | Ht 71.0 in | Wt 266.8 lb

## 2018-02-02 DIAGNOSIS — E118 Type 2 diabetes mellitus with unspecified complications: Secondary | ICD-10-CM | POA: Insufficient documentation

## 2018-02-02 DIAGNOSIS — Z9049 Acquired absence of other specified parts of digestive tract: Secondary | ICD-10-CM | POA: Diagnosis not present

## 2018-02-02 DIAGNOSIS — E1142 Type 2 diabetes mellitus with diabetic polyneuropathy: Secondary | ICD-10-CM | POA: Insufficient documentation

## 2018-02-02 DIAGNOSIS — M545 Low back pain: Secondary | ICD-10-CM | POA: Insufficient documentation

## 2018-02-02 DIAGNOSIS — Z8249 Family history of ischemic heart disease and other diseases of the circulatory system: Secondary | ICD-10-CM | POA: Diagnosis not present

## 2018-02-02 DIAGNOSIS — E1169 Type 2 diabetes mellitus with other specified complication: Secondary | ICD-10-CM | POA: Insufficient documentation

## 2018-02-02 DIAGNOSIS — M869 Osteomyelitis, unspecified: Secondary | ICD-10-CM | POA: Diagnosis not present

## 2018-02-02 DIAGNOSIS — R937 Abnormal findings on diagnostic imaging of other parts of musculoskeletal system: Secondary | ICD-10-CM | POA: Insufficient documentation

## 2018-02-02 DIAGNOSIS — Z9889 Other specified postprocedural states: Secondary | ICD-10-CM | POA: Insufficient documentation

## 2018-02-02 DIAGNOSIS — X58XXXA Exposure to other specified factors, initial encounter: Secondary | ICD-10-CM | POA: Insufficient documentation

## 2018-02-02 DIAGNOSIS — I10 Essential (primary) hypertension: Secondary | ICD-10-CM | POA: Diagnosis not present

## 2018-02-02 DIAGNOSIS — J3489 Other specified disorders of nose and nasal sinuses: Secondary | ICD-10-CM | POA: Insufficient documentation

## 2018-02-02 DIAGNOSIS — Z8781 Personal history of (healed) traumatic fracture: Secondary | ICD-10-CM | POA: Insufficient documentation

## 2018-02-02 DIAGNOSIS — K529 Noninfective gastroenteritis and colitis, unspecified: Secondary | ICD-10-CM

## 2018-02-02 DIAGNOSIS — Z79891 Long term (current) use of opiate analgesic: Secondary | ICD-10-CM | POA: Insufficient documentation

## 2018-02-02 DIAGNOSIS — E1140 Type 2 diabetes mellitus with diabetic neuropathy, unspecified: Secondary | ICD-10-CM | POA: Diagnosis not present

## 2018-02-02 DIAGNOSIS — E782 Mixed hyperlipidemia: Secondary | ICD-10-CM | POA: Insufficient documentation

## 2018-02-02 DIAGNOSIS — Z794 Long term (current) use of insulin: Secondary | ICD-10-CM | POA: Diagnosis not present

## 2018-02-02 DIAGNOSIS — G8929 Other chronic pain: Secondary | ICD-10-CM | POA: Diagnosis not present

## 2018-02-02 DIAGNOSIS — Z89421 Acquired absence of other right toe(s): Secondary | ICD-10-CM | POA: Insufficient documentation

## 2018-02-02 DIAGNOSIS — S90424D Blister (nonthermal), right lesser toe(s), subsequent encounter: Secondary | ICD-10-CM | POA: Diagnosis not present

## 2018-02-02 DIAGNOSIS — S90424A Blister (nonthermal), right lesser toe(s), initial encounter: Secondary | ICD-10-CM | POA: Diagnosis not present

## 2018-02-02 DIAGNOSIS — Z9119 Patient's noncompliance with other medical treatment and regimen: Secondary | ICD-10-CM | POA: Diagnosis not present

## 2018-02-02 DIAGNOSIS — Z79899 Other long term (current) drug therapy: Secondary | ICD-10-CM | POA: Insufficient documentation

## 2018-02-02 DIAGNOSIS — L089 Local infection of the skin and subcutaneous tissue, unspecified: Secondary | ICD-10-CM | POA: Diagnosis not present

## 2018-02-02 LAB — POCT GLYCOSYLATED HEMOGLOBIN (HGB A1C): Hemoglobin A1C: 5.6 % (ref 4.0–5.6)

## 2018-02-02 LAB — GLUCOSE, POCT (MANUAL RESULT ENTRY): POC GLUCOSE: 76 mg/dL (ref 70–99)

## 2018-02-02 MED ORDER — TRAMADOL HCL 50 MG PO TABS: 50.0000 mg | ORAL_TABLET | Freq: Three times a day (TID) | ORAL | 0 refills | Status: AC | PRN

## 2018-02-02 MED ORDER — ATORVASTATIN CALCIUM 20 MG PO TABS: 20.0000 mg | ORAL_TABLET | Freq: Every day | ORAL | 2 refills | Status: DC

## 2018-02-02 MED ORDER — GABAPENTIN 300 MG PO CAPS: 300.0000 mg | ORAL_CAPSULE | Freq: Three times a day (TID) | ORAL | 2 refills | Status: DC

## 2018-02-02 MED ORDER — INSULIN PEN NEEDLE 32G X 6 MM MISC: 1.0000 | Freq: Once | 11 refills | Status: AC

## 2018-02-02 MED ORDER — ATORVASTATIN CALCIUM 20 MG PO TABS: 20.0000 mg | ORAL_TABLET | Freq: Every day | ORAL | 1 refills | Status: DC

## 2018-02-02 MED ORDER — LISINOPRIL 10 MG PO TABS: 10.0000 mg | ORAL_TABLET | Freq: Every day | ORAL | 1 refills | Status: DC

## 2018-02-02 NOTE — Patient Instructions (Signed)
Nonallergic Rhinitis Nonallergic rhinitis is a condition that causes symptoms that affect the nose, such as a runny nose and a stuffed-up nose (nasal congestion) that can make it hard to breathe through the nose. This condition is different from having an allergy (allergic rhinitis). Allergic rhinitis occurs when the body's defense system (immune system) reacts to a substance that you are allergic to (allergen), such as pollen, pet dander, mold, or dust. Nonallergic rhinitis has many similar symptoms, but it is not caused by allergens. Nonallergic rhinitis can be a short-term or long-term problem. What are the causes? This condition can be caused by many different things. Some common types of nonallergic rhinitis include: Infectious rhinitis  This is usually due to an infection in the upper respiratory tract. Vasomotor rhinitis  This is the most common type of long-term nonallergic rhinitis.  It is caused by too much blood flow through the nose, which makes the tissue inside of the nose swell.  Symptoms are often triggered by strong odors, cold air, stress, drinking alcohol, cigarette smoke, or changes in the weather. Occupational rhinitis  This type is caused by triggers in the workplace, such as chemicals, dusts, animal dander, or air pollution. Hormonal rhinitis  This type occurs in women as a result of an increase in the male hormone estrogen.  It may occur during pregnancy, puberty, and menstrual cycles.  Symptoms improve when estrogen levels drop. Drug-induced rhinitis Several drugs can cause nonallergic rhinitis, including:  Medicines that are used to treat high blood pressure, heart disease, and Parkinson disease.  Aspirin and NSAIDs.  Over-the-counter nasal decongestant sprays. These can cause a type of nonallergic rhinitis (rhinitis medicamentosa) when they are used for more than a few days.  Nonallergic rhinitis with eosinophilia syndrome (NARES)  This type is caused  by having too much of a certain type of white blood cell (eosinophil). Nonallergic rhinitis can also be caused by a reaction to eating hot or spicy foods. This does not usually cause long-term symptoms. In some cases, the cause of nonallergic rhinitis is not known. What increases the risk? You are more likely to develop this condition if:  You are 30-60 years of age.  You are a woman. Women are twice as likely to have this condition.  What are the signs or symptoms? Common symptoms of this condition include:  Nasal congestion.  Runny nose.  The feeling of mucus going down the back of the throat (postnasal drip).  Trouble sleeping at night and daytime sleepiness.  Less common symptoms include:  Sneezing.  Coughing.  Itchy nose.  Bloodshot eyes.  How is this diagnosed? This condition may be diagnosed based on:  Your symptoms and medical history.  A physical exam.  Allergy testing to rule out allergic rhinitis. You may have skin tests or blood tests.  In some cases, the health care provider may take a swab of nasal secretions to look for an increased number of eosinophils. This would be done to confirm a diagnosis of NARES. How is this treated? Treatment for this condition depends on the cause. No single treatment works for everyone. Work with your health care provider to find the best treatment for you. Treatment may include:  Avoiding the things that trigger your symptoms.  Using medicines to relieve congestion, such as: ? Steroid nasal spray. There are many types. You may need to try a few to find out which one works best. ? Decongestant medicine. This may be an oral medicine or a nasal spray. These   medicines are only used for a short time.  Using medicines to relieve a runny nose. These may include antihistamine medicines or anticholinergic nasal sprays.  Surgery to remove tissue from inside the nose may be needed in severe cases if the condition has not improved  after 6-12 months of medical treatment. Follow these instructions at home:  Take or use over-the-counter and prescription medicines only as told by your health care provider. Do not stop using your medicine even if you start to feel better.  Use salt-water (saline) rinses or other solutions (nasal washes or irrigations) to wash or rinse out the inside of your nose as told by your health care provider.  Do not take NSAIDs or medicines that contain aspirin if they make your symptoms worse.  Do not drink alcohol if it makes your symptoms worse.  Do not use any tobacco products, such as cigarettes, chewing tobacco, and e-cigarettes. If you need help quitting, ask your health care provider.  Avoid secondhand smoke.  Get some exercise every day. Exercise may help reduce symptoms of nonallergic rhinitis for some people. Ask your health care provider how much exercise and what types of exercise are safe for you.  Sleep with the head of your bed raised (elevated). This may reduce nighttime nasal congestion.  Keep all follow-up visits as told by your health care provider. This is important. Contact a health care provider if:  You have a fever.  Your symptoms are getting worse at home.  Your symptoms are not responding to medicine.  You develop new symptoms, especially a headache or nosebleed. This information is not intended to replace advice given to you by your health care provider. Make sure you discuss any questions you have with your health care provider. Document Released: 11/16/2015 Document Revised: 12/31/2015 Document Reviewed: 10/15/2015 Elsevier Interactive Patient Education  2018 [employer]  

## 2018-02-02 NOTE — ED Notes (Signed)
Called for Pt No answer 

## 2018-02-02 NOTE — Progress Notes (Signed)
[id number] 

## 2018-02-02 NOTE — Progress Notes (Signed)
Assessment & Plan:  Haskel was seen today for establish care and wound check.  Diagnoses and all orders for this visit:  Type 2 diabetes mellitus with complication, with long-term current use of insulin (HCC) -     Glucose (CBG) -     HgB A1c -     AMB referral to wound care center -     DG Toe Great Right; Future -     Ambulatory referral to Podiatry -     Insulin Pen Needle 32G X 6 MM MISC; 1 kit by Does not apply route once for 1 dose.  Mixed hyperlipidemia -     atorvastatin (LIPITOR) 20 MG tablet; Take 1 tablet (20 mg total) by mouth daily. INSTRUCTIONS: Work on a low fat, heart healthy diet and participate in regular aerobic exercise program by working out at least 150 minutes per week. No fried foods. No junk foods, sodas, sugary drinks, unhealthy snacking, alcohol or smoking.    Type 2 diabetes mellitus with diabetic neuropathy, with long-term current use of insulin (HCC) -     gabapentin (NEURONTIN) 300 MG capsule; Take 1 capsule (300 mg total) by mouth 3 (three) times daily. Continue blood sugar control as discussed in office today, low carbohydrate diet, and regular physical exercise as tolerated, 150 minutes per week (30 min each day, 5 days per week, or 50 min 3 days per week). Keep blood sugar logs with fasting goal of 80-130 mg/dl, post prandial less than 180.  For Hypoglycemia: BS <60 and Hyperglycemia BS >400; contact the clinic ASAP. Annual eye exams and foot exams are recommended.   Essential hypertension, benign -     lisinopril (PRINIVIL,ZESTRIL) 10 MG tablet; Take 1 tablet (10 mg total) by mouth daily. Continue all antihypertensives as prescribed.  Remember to bring in your blood pressure log with you for your follow up appointment.  DASH/Mediterranean Diets are healthier choices for HTN.    Rhinorrhea -     Ambulatory referral to ENT  Blister of toe of right foot with infection, subsequent encounter -     traMADol (ULTRAM) 50 MG tablet; Take 1 tablet (50  mg total) by mouth every 8 (eight) hours as needed for up to 7 days. Continue keflex as prescribed.  Chronic diarrhea -     Ambulatory referral to Gastroenterology    Patient has been counseled on age-appropriate routine health concerns for screening and prevention. These are reviewed and up-to-date. Referrals have been placed accordingly. Immunizations are up-to-date or declined.    Subjective:   Chief Complaint  Patient presents with  . Establish Care    Pt. is here to establish care for diabetes and hypertension.   . Wound Check    Pt. stated he went to urgent care and his "great toe" is infected. He stated need a to see a wound specialist.    HPI [name] 52 y.o. male presents to office today to establish care today.  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    Type 2 Diabetes Mellitus Disease course has been improving and down to 5.6. There are no hypoglycemic symptoms. There are no hypoglycemic complications. Symptoms are stable. There are diabetic complications including polyneuropathy and right great toe infectoin. Risk factors for coronary artery disease include family history, dyslipidemia, diabetes mellitus, obesity, hypertension, sedentary lifestyle. Current diabetic treatment includes  Novolog 0-15 units SSI (he is not taking as prescribed), Lantus 38 units, Metformin 1000 mg BID (stopped taking due to GI upset) . Patient is not compliant with treatment all of the time and monitors blood glucose at home inconsistently. He states he lost his glucometer.   Weight is  Improving. He has lost almost 30 lbs. Patient follows a generally healthy diet and has been exercising as well. Meal planning includes avoidance of concentrated sweets.  Patient is compliant with exercise.   An ACE inhibitor/angiotensin II receptor  blocker is being taken. Patient does not see a podiatrist (will place referral today). Eye exam is not current ( a few years ago).  Lab Results  Component Value Date   HGBA1C 5.6 02/02/2018   Lab Results  Component Value Date   HGBA1C 6.5 08/07/2017    Hyperlipidemia Patient presents for follow up to hyperlipidemia.  He is medication compliant taking atorvastatin 87m daily. He is diet compliant and denies skin xanthelasma or statin intolerance including myalgias.   Lab Results  Component Value Date   CHOL 235 (H) 08/14/2017   Lab Results  Component Value Date   HDL 49 08/14/2017   Lab Results  Component Value Date   LDLCALC 146 (H) 08/14/2017   Lab Results  Component Value Date   TRIG 198 (H) 08/14/2017   Lab Results  Component Value Date   CHOLHDL 4.8 08/14/2017   Essential Hypertension Chronc and well controlled on lisinopril 10 mg daily. Denies chest pain, shortness of breath, palpitations, lightheadedness, dizziness, headaches or BLE edema.  BP Readings from Last 3 Encounters:  02/02/18 112/72  08/21/17 124/78  08/07/17 116/74    Allergic Rhinitis with chronic Rhinorrhea  RJden Wantis here for evaluation of possible allergic rhinitis. Patient's symptoms include clear rhinorrhea and postnasal drip. These symptoms are perennial. Current triggers include exposure to no known precipitant. The patient has been suffering from these symptoms for approximately a few years. The patient has tried over the counter medications, prescription antihistamines and prescription nasal sprays with poor relief of symptoms. Immunotherapy has never been tried. The patient has never had nasal polyps. The patient has no history of asthma. The patient does not suffer from frequent sinopulmonary infections. The patient has not had sinus surgery in the past. The patient has no history of eczema.  Chronic Diarrhea FOBT negative 08-07-2017. He saw GI on 08-21-2017 for chronic diarrhea.  Review  of notes:  Trial of Cholestyramine 2 g BID for possible bile acid malabsorption.  -ROV 3-4 weeks. If no improvement consider colonoscopy with biopsies to rule microscopic colitis -food diary to determine if diarrhea worse with certain foods.  He can not recall if he took the cholestyramine. Will have him follow up with GI as diarrhea continues despite stopping metformin.    Skin Infection He was treated with 10 day trial of keflex for Right great toe infection on 01-29-2018 in the urgent care. He states the infection started off as a blister which became unroofed. He then began to notice pain, swelling and erythema of the area. Today bruising remains as well as an exposed area of skin at the tip of the great toe. Remaining area of previous blister has begun to form a callous. He continues on keflex.   Review of Systems  Constitutional: Negative for fever, malaise/fatigue and weight loss.  HENT: Negative.  Negative for nosebleeds.        Rhinorrhea  Eyes: Negative.  Negative for blurred vision, double vision  and photophobia.  Respiratory: Negative.  Negative for cough and shortness of breath.   Cardiovascular: Negative.  Negative for chest pain, palpitations and leg swelling.  Gastrointestinal: Positive for diarrhea. Negative for heartburn, nausea and vomiting.  Musculoskeletal: Negative.  Negative for myalgias.  Skin:       SEE HPI  Neurological: Positive for sensory change (neuropathy). Negative for dizziness, focal weakness, seizures and headaches.  Endo/Heme/Allergies: Positive for environmental allergies.  Psychiatric/Behavioral: Negative.  Negative for suicidal ideas.    Past Medical History:  Diagnosis Date  . Diabetes mellitus without complication ([place])   . Fracture closed, humerus    right  . Fracture of humeral shaft, right, closed 09/02/2016  . Fracture of humerus, proximal, right, closed 09/03/2016  . Hypertension     Past Surgical History:  Procedure Laterality Date  .  AMPUTATION TOE Right 06/16/2017   Procedure: AMPUTATION TOE right foot 2nd toe;  Surgeon: Newt Minion, MD;  Location: [place];  Service: Orthopedics;  Laterality: Right;  . CHOLECYSTECTOMY    . cyst removal knee    . CYST REMOVAL NECK    . ORIF HUMERUS FRACTURE Right 09/02/2016   Procedure: OPEN REDUCTION INTERNAL FIXATION (ORIF) RIGHT  HUMERAL SHAFT FRACTURE;  Surgeon: Altamese [place], MD;  Location: [place];  Service: Orthopedics;  Laterality: Right;  . WISDOM TOOTH EXTRACTION      Family History  Problem Relation Age of Onset  . Sjogren's syndrome Mother   . CAD Father   . Diabetes Father   . Heart disease Father     Social History Reviewed with no changes to be made today.   Outpatient Medications Prior to Visit  Medication Sig Dispense Refill  . Blood Glucose Monitoring Suppl (CONTOUR NEXT EZ) w/Device KIT 1 each by Does not apply route 3 (three) times daily. 1 kit 0  . glucose blood (CONTOUR NEXT TEST) test strip Use as instructed 100 each 12  . Insulin Glargine (LANTUS SOLOSTAR) 100 UNIT/ML Solostar Pen Inject 38 Units into the skin daily at 10 pm. 5 pen 11  . MICROLET LANCETS MISC 1 each by Does not apply route 3 (three) times daily. 100 each 12  . mupirocin ointment (BACTROBAN) 2 % Apply 1 application topically 2 (two) times daily. Apply to the affected area 2 times a day 22 g 3  . gabapentin (NEURONTIN) 300 MG capsule Take 1 capsule (300 mg total) by mouth 3 (three) times daily. 90 capsule 2  . insulin aspart (NOVOLOG FLEXPEN) 100 UNIT/ML FlexPen Inject 0-15 Units into the skin 3 (three) times daily with meals. 45 mL 0  . lisinopril (PRINIVIL,ZESTRIL) 10 MG tablet Take 1 tablet (10 mg total) by mouth daily. 90 tablet 1  . loperamide (IMODIUM A-D) 2 MG tablet Take 1 tablet (2 mg total) by mouth 4 (four) times daily as needed for diarrhea or loose stools. 30 tablet 0  . levocetirizine (XYZAL) 5 MG tablet Take 1 tablet (5 mg total) by mouth every evening. (Patient not taking:  Reported on 02/02/2018)    . nitroGLYCERIN (NITRODUR - DOSED IN MG/24 HR) 0.2 mg/hr patch Place 1 patch (0.2 mg total) onto the skin daily. (Patient not taking: Reported on 02/02/2018) 30 patch 12  . atorvastatin (LIPITOR) 20 MG tablet Take 1 tablet (20 mg total) by mouth daily. (Patient not taking: Reported on 02/02/2018) 30 tablet 2  . cholestyramine (QUESTRAN) 4 g packet Take 1 packet (4 g total) by mouth as directed. TAKE 2 GM DAILY. (Patient  not taking: Reported on 02/02/2018) 30 each 12  . metFORMIN (GLUCOPHAGE XR) 500 MG 24 hr tablet Take 2 tablets (1,000 mg total) by mouth 2 (two) times daily with a meal. (Patient not taking: Reported on 02/02/2018) 120 tablet 3  . oxyCODONE-acetaminophen (ROXICET) 5-325 MG tablet 1 tablet BID prn pain (Patient not taking: Reported on 02/02/2018) 30 tablet 0   No facility-administered medications prior to visit.     Allergies  Allergen Reactions  . No Known Allergies        Objective:    BP 112/72 (BP Location: Left Arm, Patient Position: Sitting, Cuff Size: Normal)   Pulse (!) 104   Temp 99 F (37.2 C) (Oral)   Ht _0  (1.803 m)   Wt 266 lb 12.8 oz (121 kg)   SpO2 100%   BMI 37.21 kg/m  Wt Readings from Last 3 Encounters:  02/02/18 266 lb 12.8 oz (121 kg)  10/09/17 285 lb (129.3 kg)  08/21/17 285 lb (129.3 kg)    Physical Exam       Patient has been counseled extensively about nutrition and exercise as well as the importance of adherence with medications and regular follow-up. The patient was given clear instructions to go to ER or return to medical center if symptoms don't improve, worsen or new problems develop. The patient verbalized understanding.   Follow-up: No follow-ups on file.   Gildardo Pounds, FNP-BC [facility] [place], [place]   02/02/2018, 10:57 PM

## 2018-02-02 NOTE — ED Notes (Signed)
Called pt for final attempt at triage. No answer.

## 2018-02-05 ENCOUNTER — Other Ambulatory Visit: Payer: Self-pay | Admitting: Nurse Practitioner

## 2018-02-05 ENCOUNTER — Telehealth: Payer: Self-pay | Admitting: *Deleted

## 2018-02-05 DIAGNOSIS — L089 Local infection of the skin and subcutaneous tissue, unspecified: Secondary | ICD-10-CM

## 2018-02-05 NOTE — Telephone Encounter (Signed)
Call report taken on Great toe subluxation from Erskine SquibbJane at [facility].  Will inform PCP and route message.

## 2018-02-06 ENCOUNTER — Telehealth: Payer: Self-pay | Admitting: Nurse Practitioner

## 2018-02-06 NOTE — Telephone Encounter (Signed)
Antibiotics by mouth will not treat a possible bone infection. He could lose his toe or foot. I haven't received a call from the insurance company or the hospital requesting prior authorization. Alesia BandaBien can you find out if it was approved or denied and if I need to speak to someone with the insurance company?

## 2018-02-06 NOTE — Telephone Encounter (Signed)
Patient called back and said can you just give antibiotics because he cannot pay the 3,000 for the MRI is insurance will not cover it. Also he wants to be sent to a wound clinic.  Please let the nurse know so she can call him back.

## 2018-02-06 NOTE — Telephone Encounter (Signed)
Spoke to patient and scheduled an MRI for his great big toe.

## 2018-02-06 NOTE — Telephone Encounter (Signed)
Patient called for results and also to inform the dr. He has hit his toe and the toe had start to have a discharger.

## 2018-02-06 NOTE — Telephone Encounter (Signed)
NOTED. MRI has been ordered

## 2018-02-07 ENCOUNTER — Telehealth: Payer: Self-pay

## 2018-02-07 NOTE — Telephone Encounter (Signed)
Jaya attempt toDebby Bud do a Prior Auth for patient. Prior Auth was unable to proceed due to the address on the insurance and the address we have on file is not the same. Left a VM for patient to call his [employer] insurance, then we can proceed with the Prior Auth before his MRI on July 10th.

## 2018-02-07 NOTE — Telephone Encounter (Signed)
CMA informed patient his x-ray results and scheduled the MRI for patient.  Patient understood.

## 2018-02-07 NOTE — Telephone Encounter (Signed)
Noted. Thanks.

## 2018-02-07 NOTE — Telephone Encounter (Signed)
Patient called address has been changed

## 2018-02-07 NOTE — Telephone Encounter (Signed)
-----   Message from Claiborne RiggZelda W Fleming, NP sent at 02/05/2018  9:26 PM EDT ----- I have ordered an MRI of your right great toe for further evaluation. Your xray showed possible early bone infection. Will need MRI for clarification.

## 2018-02-07 NOTE — Telephone Encounter (Signed)
Contacted [employer] and spoke with Maisie Fushomas to start prior auth for pt MRI toes right w/wo contrast unfortunately  I was unable to do the prior auth because the address we have in our system does not math the address that [employer] and [employer] have in their system. So was unable to do prior auth.   Contacted the pt and left a detailed vm informing him that he will need to contact the insurance company to make sure that all his information is correct in their system and in ours so that we can do the prior auth before his appointment that is schedule for February 14, 2018

## 2018-02-09 ENCOUNTER — Other Ambulatory Visit: Payer: Self-pay | Admitting: Nurse Practitioner

## 2018-02-09 ENCOUNTER — Telehealth: Payer: Self-pay | Admitting: Nurse Practitioner

## 2018-02-09 DIAGNOSIS — L089 Local infection of the skin and subcutaneous tissue, unspecified: Secondary | ICD-10-CM

## 2018-02-09 NOTE — Telephone Encounter (Signed)
CMA attempt to call patient back to inform PCP referred him out to [facility] for his toe infection. No answer and left a VM.

## 2018-02-09 NOTE — Telephone Encounter (Signed)
Patient called and stated that he updated his information with his insurance. Please fu at your earliest convenience as far as prior [place] . Patient also stated that when he spoke with someone at the [facility] that he was told that he would have to pay $3000 since he has not met his insurance deductible. Patient was wondering if there was another options. Patient stated that the wound on the toe is slightly open and he finished his last antibiotic pill yesterday and was wondering if he would need more. Patient stated he has been keeping it clean and changing bandages daily. Please fu at your earliest convenience.

## 2018-02-11 NOTE — Telephone Encounter (Signed)
Noted  

## 2018-02-14 ENCOUNTER — Ambulatory Visit (HOSPITAL_COMMUNITY): Payer: BLUE CROSS/BLUE SHIELD

## 2018-02-19 ENCOUNTER — Ambulatory Visit: Payer: BLUE CROSS/BLUE SHIELD | Admitting: Nurse Practitioner

## 2018-02-20 ENCOUNTER — Ambulatory Visit: Payer: BLUE CROSS/BLUE SHIELD | Admitting: Nurse Practitioner

## 2018-02-20 DIAGNOSIS — E785 Hyperlipidemia, unspecified: Secondary | ICD-10-CM | POA: Insufficient documentation

## 2018-02-20 DIAGNOSIS — L089 Local infection of the skin and subcutaneous tissue, unspecified: Secondary | ICD-10-CM | POA: Insufficient documentation

## 2018-02-21 ENCOUNTER — Encounter (HOSPITAL_BASED_OUTPATIENT_CLINIC_OR_DEPARTMENT_OTHER): Payer: BLUE CROSS/BLUE SHIELD | Attending: Internal Medicine

## 2018-02-26 ENCOUNTER — Ambulatory Visit: Payer: BLUE CROSS/BLUE SHIELD | Admitting: Podiatry

## 2018-03-01 MED FILL — LANTUS SOLOSTAR 100 UNITS/M: 100 | 31 days supply | Qty: 12 | Fill #4

## 2018-04-11 IMAGING — MR MR LUMBAR SPINE W/O CM
4 of 5 series · 18 of 48 positions shown · non-contrast
Comparison: Outside Lumbar radiographs 01/16/2017.

CLINICAL DATA: 50-year-old male with 1 year of mid to low back
pain. Pain radiating to the hips. No known injury.

EXAM:
MRI LUMBAR SPINE WITHOUT CONTRAST
TECHNIQUE: Multiplanar, multisequence MR imaging of the lumbar spine was
performed. No intravenous contrast was administered.

[Series 6: T2 · sagittal · 4.0mm · 0.76mm/px · 6 of 15 slices shown (1 of 2)]
[im 1/15]
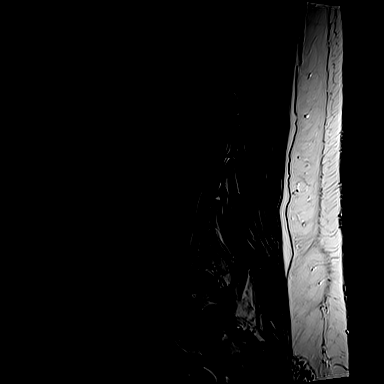
[im 3/15]
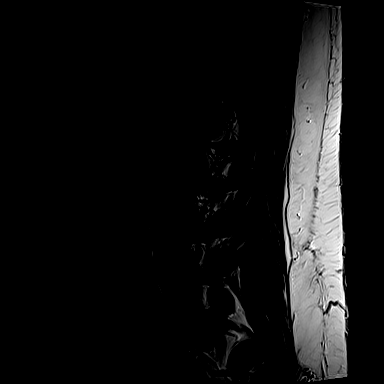
[im 6/15]
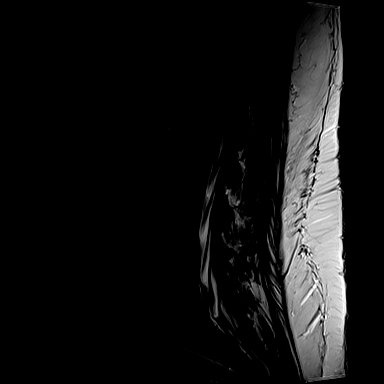
[im 9/15]
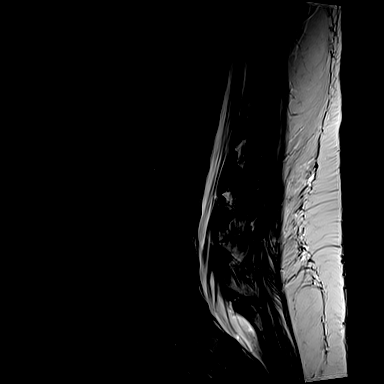
[im 12/15]
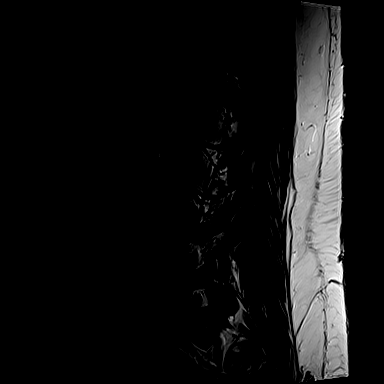
[im 15/15]
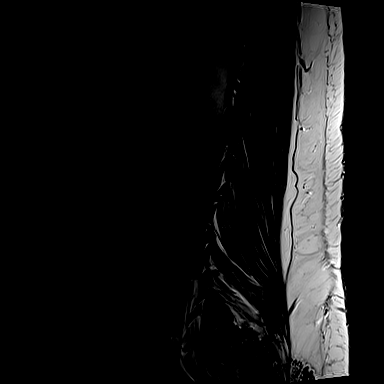

[Series 7: T1 · sagittal · 4.0mm · 0.73mm/px · 3 of 15 slices shown (1 of 2)]
[im 1/15]
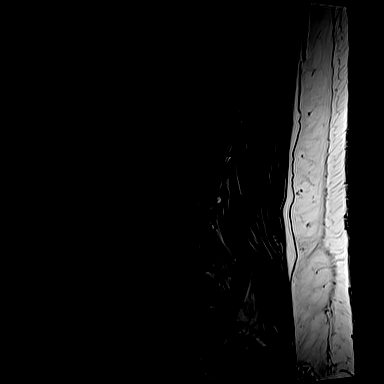
[im 8/15]
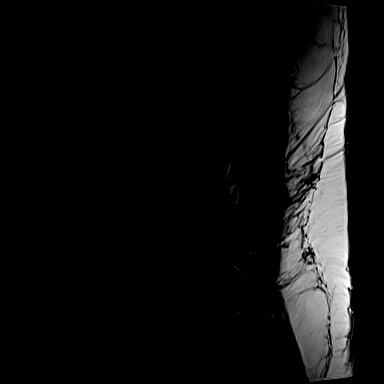
[im 15/15]
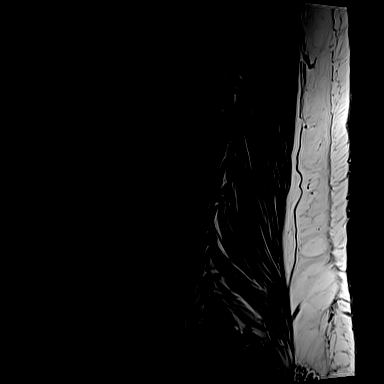

[Series 13: T2 · axial · 4.0mm · 0.28mm/px · z∈[-93,+100]mm · 6 of 46 slices shown (2 of 2)]
[im 4/46]
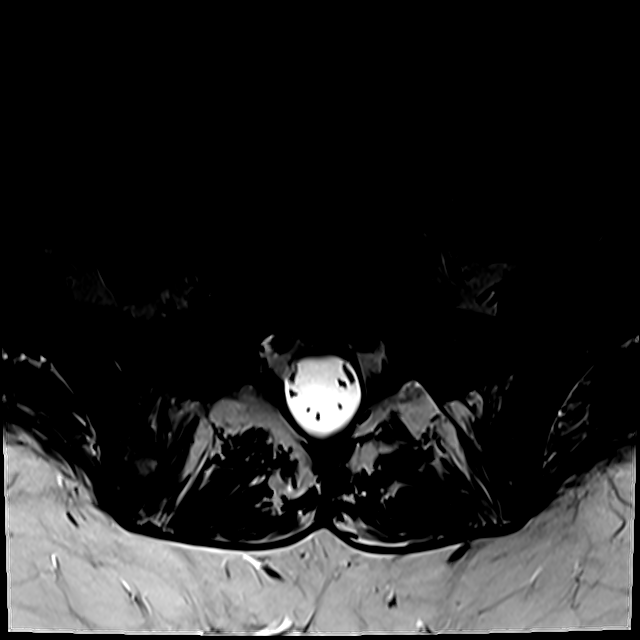
[im 7/46]
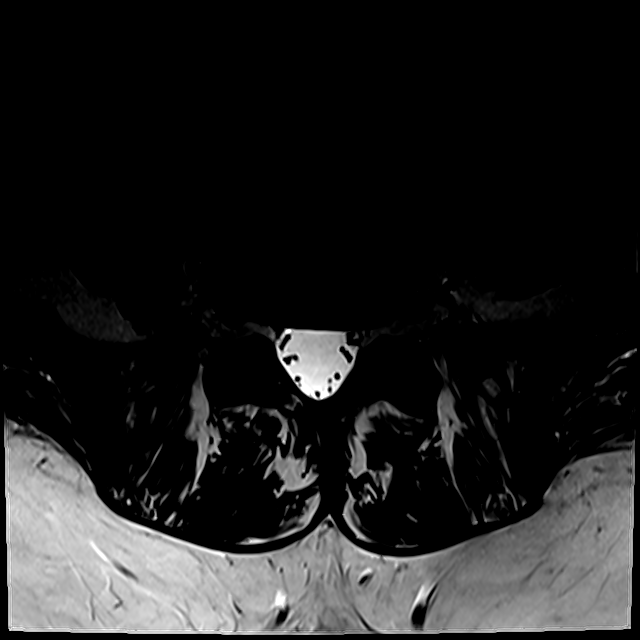
[im 10/46]
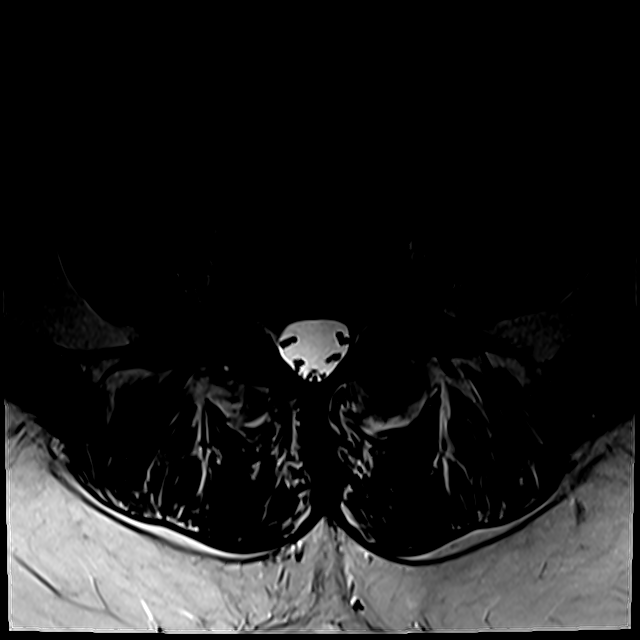
[im 16/46]
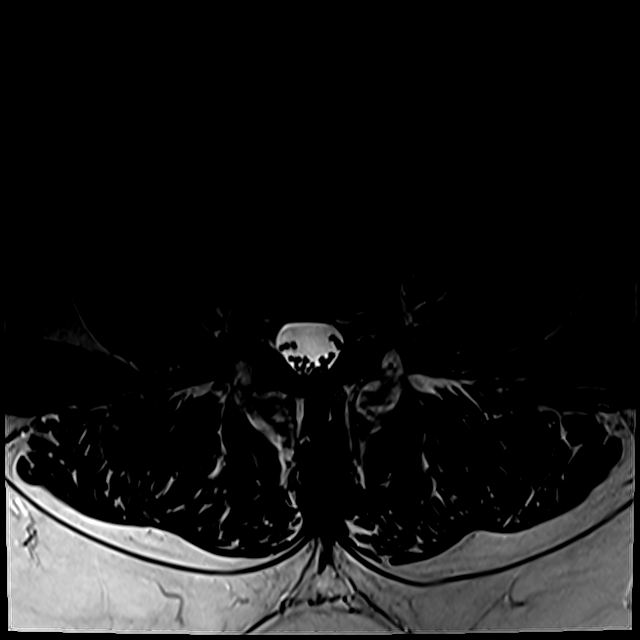
[im 25/46]
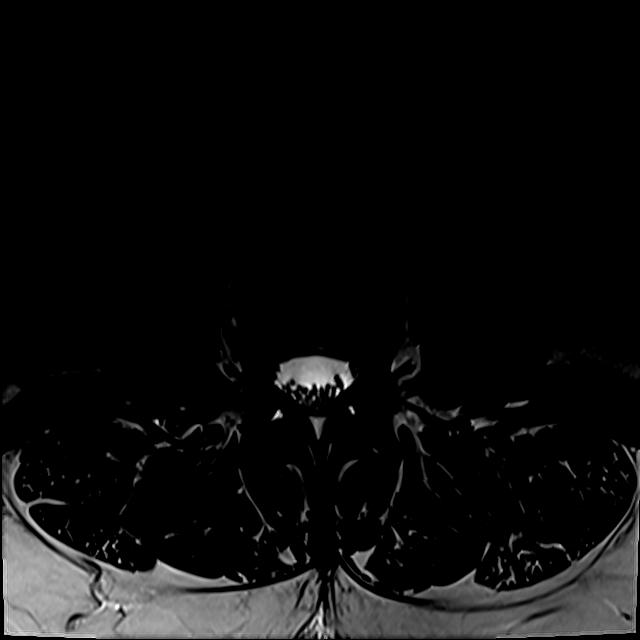
[im 40/46]
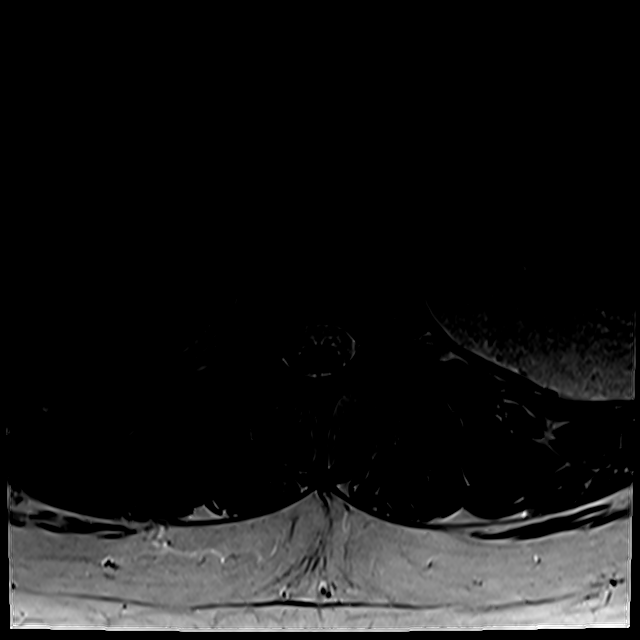

[Series 100: T1 · axial · 4.0mm · 0.28mm/px · z∈[-78,+100]mm · 3 of 46 slices shown (2 of 2)]
[im 7/46]
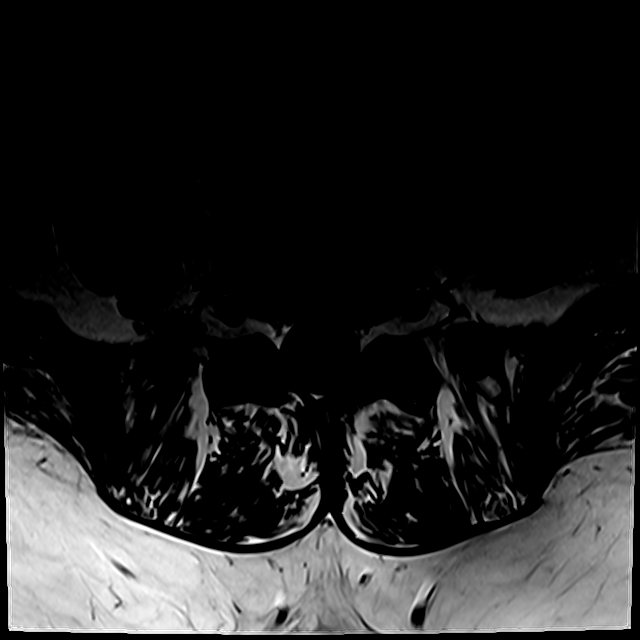
[im 25/46]
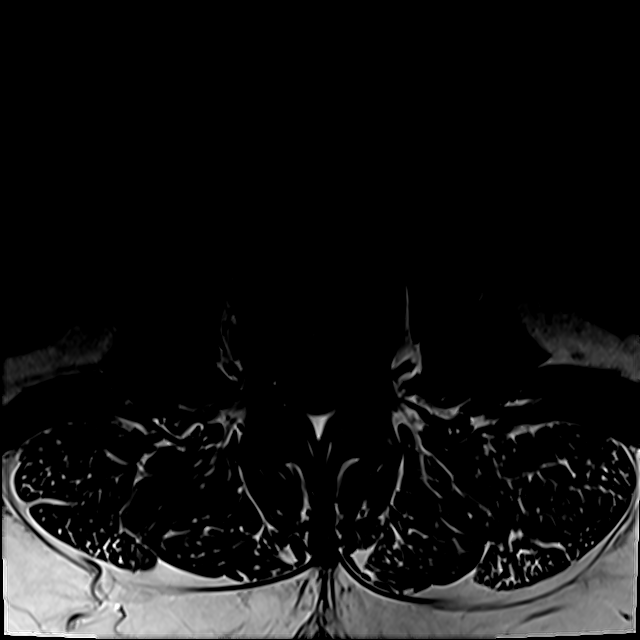
[im 40/46]
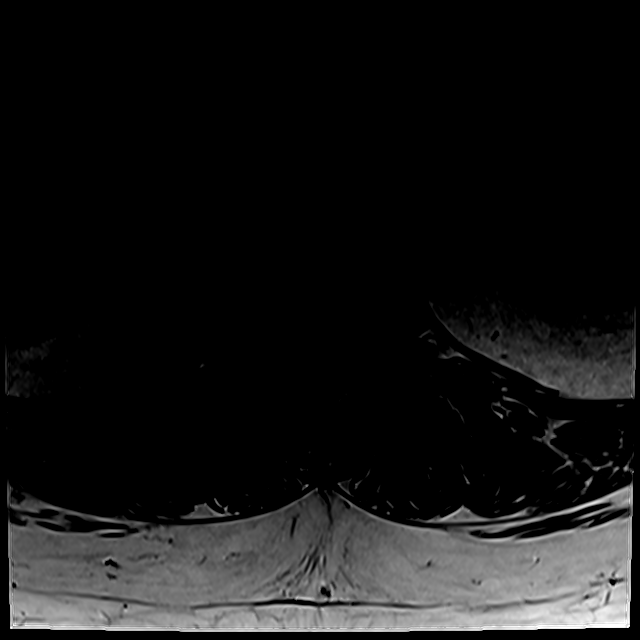

[18 of 48 positions shown; findings below may reference images not displayed]

FINDINGS: Segmentation:  Normal as demonstrated on the comparison radiographs.

Alignment: Mild straightening of lumbar lordosis, but otherwise
normal vertebral alignment.

Vertebrae: Heterogeneous bone marrow signal throughout the visible
spine and pelvis. Heterogeneously decreased T1 marrow signal
throughout. Occasional areas of preserved fatty marrow are noted in
the sacrum. At the same time there is no bone destruction or marrow
edema. There are occasional small chronic endplate Schmorl nodes.
Negative visible SI joints.

Conus medullaris: Extends to the L2 level and appears normal. Fairly
capacious spinal canal.

Paraspinal and other soft tissues: Negative visualized abdominal
viscera.

There is mild soft tissue inflammation about the chronically
degenerated left L4-L5 facet joint (series 8, image 12). Other
visualized posterior paraspinal soft tissues are within normal
limits.

Disc levels:

T10-T11: Partially visible, appears negative.

T11-12: Mild disc desiccation and disc space loss. Mild facet
hypertrophy. No definite stenosis.

T12-L1:  Negative.

L1-L2:  Negative.

L2-L3: Mild far lateral disc bulging and endplate spurring. Mild
facet hypertrophy. No stenosis.

L3-L4: Mild disc desiccation. Circumferential but mostly far lateral
disc bulging. Mild facet hypertrophy. No stenosis.

L4-L5: Mild far lateral disc bulging. Moderate facet hypertrophy
with bilateral facet joint fluid (series 13, image 3). Mild soft
tissue inflammation about the left facet as stated above. No spinal
or lateral recess stenosis. Mild if any right L4 neural foraminal
stenosis.

L5-S1:  Minimal disc bulge.  Mild facet hypertrophy.  No stenosis.
IMPRESSION: 1. Heterogeneous bone marrow signal throughout the visible spine and
pelvis. This is abnormal but nonspecific. I favor a metabolic
etiology such as anemia, smoking, or obesity - rather than a
malignant marrow infiltrative process. Recommend laboratory
correlation.
2. Minimal lumbar disc degeneration, and no discrete disc herniation
or spinal stenosis. But there is up to moderate lumbar facet
arthropathy, including mild degenerative appearing inflammation of
chronically degenerated facets at L4-L5.

## 2018-05-07 ENCOUNTER — Ambulatory Visit: Payer: BLUE CROSS/BLUE SHIELD | Admitting: Nurse Practitioner

## 2022-06-28 DIAGNOSIS — M25571 Pain in right ankle and joints of right foot: Secondary | ICD-10-CM | POA: Insufficient documentation

## 2022-06-28 DIAGNOSIS — S82831A Other fracture of upper and lower end of right fibula, initial encounter for closed fracture: Secondary | ICD-10-CM | POA: Insufficient documentation

## 2022-07-13 DIAGNOSIS — I63212 Cerebral infarction due to unspecified occlusion or stenosis of left vertebral arteries: Secondary | ICD-10-CM | POA: Insufficient documentation

## 2022-07-13 DIAGNOSIS — I639 Cerebral infarction, unspecified: Secondary | ICD-10-CM | POA: Insufficient documentation

## 2022-07-15 DIAGNOSIS — I6502 Occlusion and stenosis of left vertebral artery: Secondary | ICD-10-CM | POA: Insufficient documentation

## 2022-07-15 DIAGNOSIS — I6521 Occlusion and stenosis of right carotid artery: Secondary | ICD-10-CM | POA: Insufficient documentation

## 2022-07-15 DIAGNOSIS — E66813 Obesity, class 3: Secondary | ICD-10-CM | POA: Insufficient documentation

## 2022-10-24 DIAGNOSIS — Z6841 Body Mass Index (BMI) 40.0 and over, adult: Secondary | ICD-10-CM | POA: Insufficient documentation

## 2022-11-01 DIAGNOSIS — I480 Paroxysmal atrial fibrillation: Secondary | ICD-10-CM | POA: Insufficient documentation

## 2022-12-13 DIAGNOSIS — E083312 Diabetes mellitus due to underlying condition with moderate nonproliferative diabetic retinopathy with macular edema, left eye: Secondary | ICD-10-CM | POA: Insufficient documentation

## 2022-12-24 DIAGNOSIS — F32A Depression, unspecified: Secondary | ICD-10-CM | POA: Insufficient documentation

## 2022-12-24 DIAGNOSIS — R200 Anesthesia of skin: Secondary | ICD-10-CM | POA: Insufficient documentation

## 2022-12-24 DIAGNOSIS — G608 Other hereditary and idiopathic neuropathies: Secondary | ICD-10-CM | POA: Insufficient documentation

## 2022-12-24 DIAGNOSIS — Z8673 Personal history of transient ischemic attack (TIA), and cerebral infarction without residual deficits: Secondary | ICD-10-CM | POA: Insufficient documentation

## 2023-01-19 DIAGNOSIS — Z9841 Cataract extraction status, right eye: Secondary | ICD-10-CM | POA: Insufficient documentation

## 2023-03-29 DIAGNOSIS — M48061 Spinal stenosis, lumbar region without neurogenic claudication: Secondary | ICD-10-CM | POA: Insufficient documentation

## 2023-03-29 DIAGNOSIS — M5416 Radiculopathy, lumbar region: Secondary | ICD-10-CM | POA: Insufficient documentation

## 2023-05-18 DIAGNOSIS — Z9842 Cataract extraction status, left eye: Secondary | ICD-10-CM | POA: Insufficient documentation

## 2023-06-12 DIAGNOSIS — R55 Syncope and collapse: Secondary | ICD-10-CM | POA: Insufficient documentation

## 2023-08-08 ENCOUNTER — Encounter: Payer: Self-pay | Admitting: Family Medicine

## 2023-08-31 ENCOUNTER — Encounter: Payer: Self-pay | Admitting: Gastroenterology

## 2023-10-20 ENCOUNTER — Encounter: Payer: Self-pay | Admitting: Gastroenterology

## 2023-10-30 ENCOUNTER — Emergency Department

## 2023-10-30 ENCOUNTER — Encounter: Admission: RE | Payer: Self-pay | Source: Home / Self Care

## 2023-10-30 ENCOUNTER — Encounter: Payer: Self-pay | Admitting: Anesthesiology

## 2023-10-30 ENCOUNTER — Ambulatory Visit: Admission: RE | Admit: 2023-10-30 | Payer: Medicaid Other | Source: Home / Self Care | Admitting: Gastroenterology

## 2023-10-30 ENCOUNTER — Encounter: Payer: Self-pay | Admitting: Emergency Medicine

## 2023-10-30 ENCOUNTER — Emergency Department
Admission: EM | Admit: 2023-10-30 | Discharge: 2023-10-31 | Disposition: A | Discharge status: Home / Self Care | Attending: Emergency Medicine | Admitting: Emergency Medicine

## 2023-10-30 ENCOUNTER — Other Ambulatory Visit: Payer: Self-pay

## 2023-10-30 DIAGNOSIS — I10 Essential (primary) hypertension: Secondary | ICD-10-CM | POA: Diagnosis not present

## 2023-10-30 DIAGNOSIS — R531 Weakness: Secondary | ICD-10-CM | POA: Diagnosis present

## 2023-10-30 DIAGNOSIS — E119 Type 2 diabetes mellitus without complications: Secondary | ICD-10-CM | POA: Insufficient documentation

## 2023-10-30 HISTORY — DX: Cerebral infarction, unspecified: I63.9

## 2023-10-30 HISTORY — DX: Paroxysmal atrial fibrillation: I48.0

## 2023-10-30 HISTORY — DX: Hyperlipidemia, unspecified: E78.5

## 2023-10-30 HISTORY — DX: Syncope and collapse: R55

## 2023-10-30 HISTORY — DX: Occlusion and stenosis of right carotid artery: I65.21

## 2023-10-30 HISTORY — DX: Vasomotor rhinitis: J30.0

## 2023-10-30 LAB — URINALYSIS, ROUTINE W REFLEX MICROSCOPIC
Bacteria, UA: NONE SEEN
Bilirubin Urine: NEGATIVE
Glucose, UA: NEGATIVE mg/dL
Hgb urine dipstick: NEGATIVE
Ketones, ur: NEGATIVE mg/dL
Leukocytes,Ua: NEGATIVE
Nitrite: NEGATIVE
Protein, ur: 30 mg/dL — AB
Specific Gravity, Urine: 1.026 (ref 1.005–1.030)
pH: 5 (ref 5.0–8.0)

## 2023-10-30 LAB — BASIC METABOLIC PANEL
Anion gap: 10 (ref 5–15)
BUN: 32 mg/dL — ABNORMAL HIGH (ref 6–20)
CO2: 19 mmol/L — ABNORMAL LOW (ref 22–32)
Calcium: 9 mg/dL (ref 8.9–10.3)
Chloride: 107 mmol/L (ref 98–111)
Creatinine, Ser: 0.9 mg/dL (ref 0.61–1.24)
GFR, Estimated: >60 mL/min (ref >60)
Glucose, Bld: 219 mg/dL — ABNORMAL HIGH (ref 70–99)
Potassium: 4.4 mmol/L (ref 3.5–5.1)
Sodium: 136 mmol/L (ref 135–145)

## 2023-10-30 LAB — CBC
HCT: 42.6 % (ref 39.0–52.0)
Hemoglobin: 13.8 g/dL (ref 13.0–17.0)
MCH: 27.9 pg (ref 26.0–34.0)
MCHC: 32.4 g/dL (ref 30.0–36.0)
MCV: 86.2 fL (ref 80.0–100.0)
Platelets: 227 10*3/uL (ref 150–400)
RBC: 4.94 MIL/uL (ref 4.22–5.81)
RDW: 15.6 % — ABNORMAL HIGH (ref 11.5–15.5)
WBC: 10.6 10*3/uL — ABNORMAL HIGH (ref 4.0–10.5)
nRBC: 0 % (ref 0.0–0.2)

## 2023-10-30 SURGERY — COLONOSCOPY WITH PROPOFOL
Anesthesia: General

## 2023-10-30 MED ORDER — LORAZEPAM 1 MG PO TABS
1.0000 mg | ORAL_TABLET | Freq: Once | ORAL | Status: AC
  Administered 2023-10-30: 1 mg via ORAL
  Filled 2023-10-30: qty 1

## 2023-10-30 NOTE — ED Notes (Signed)
Pt ambulating to bathroom with cane 

## 2023-10-30 NOTE — ED Provider Notes (Signed)
 [facility] Provider Note    Event Date/Time   First MD Initiated Contact with Patient 10/30/23 2204     (approximate)  History   Chief Complaint: Weakness  HPI  [name] is a 58 y.o. male with a past medical history of diabetes, hypertension, hyperlipidemia, prior CVA, presents to the emergency department for left leg weakness.  According to the patient a little over 1 year ago he had a CVA at that time he states he was more disoriented and off balance, he states his symptoms completely resolved however in June he had recurrence of symptoms with mostly left-sided deficits per patient.  States he went to the hospital had an MRI that showed recurrence of his old stroke but no new stroke.  Patient states since earlier today he has been experiencing increased weakness and heaviness in his legs left greater than right.  Was having some trouble ambulating so he came to the emergency department for evaluation.  Physical Exam   Triage Vital Signs: ED Triage Vitals  Encounter Vitals Group     BP 10/30/23 1415 120/79     Systolic BP Percentile --      Diastolic BP Percentile --      Pulse Rate 10/30/23 1415 89     Resp 10/30/23 1415 18     Temp 10/30/23 1415 98 F (36.7 C)     Temp Source 10/30/23 1415 Oral     SpO2 10/30/23 1415 100 %     Weight 10/30/23 1412 295 lb (133.8 kg)     Height 10/30/23 1412 6' (1.829 m)     Head Circumference --      Peak Flow --      Pain Score 10/30/23 1412 0     Pain Loc --      Pain Education --      Exclude from Growth Chart --     Most recent vital signs: Vitals:   10/30/23 2156 10/30/23 2219  BP: 127/75   Pulse: 79   Resp: 20   Temp:  98 F (36.7 C)  SpO2: 99%     General: Awake, no distress.  CV:  Good peripheral perfusion.  Regular rate and rhythm  Resp:  Normal effort.  Equal breath sounds bilaterally.  Abd:  No distention.  Soft, nontender.  No rebound or guarding. Other:  On examination patient has  equal grip strength bilaterally 5/5 motor in bilateral upper extremities, no facial droop noted.  Patient does have 4/5 strength in left lower extremity compared to 5/5 in the right lower extremity.   ED Results / Procedures / Treatments   EKG  EKG viewed and interpreted by myself shows a normal sinus rhythm 89 bpm with a narrow QRS, normal axis, normal intervals, no concerning ST changes.  RADIOLOGY  I have reviewed and interpreted the CT scan of the head images.  No bleed seen on my evaluation. Radiology is read the CT scan as negative   MEDICATIONS ORDERED IN ED: Medications  LORazepam (ATIVAN) tablet 1 mg (1 mg Oral Given 10/30/23 2251)     IMPRESSION / MDM / ASSESSMENT AND PLAN / ED COURSE  I reviewed the triage vital signs and the nursing notes.  Patient's presentation is most consistent with acute presentation with potential threat to life or bodily function.  Patient presents to the emergency department for heaviness/weakness sensation in his bilateral lower extremities but left greater than right.  Patient states symptoms started approximately noon today.  Patient has a history of a prior CVA however he also has a history of left-sided deficits with a negative MRI in June.  Patient states he is not sure if this is more recurrence of his prior symptoms or if he has had a new CVA so he came to the emergency department today for evaluation.  Patient's workup today shows a normal urinalysis, reassuring chemistry, reassuring CBC.  Patient CT scan head shows no acute finding.  However given the patient's history we will proceed with an MRI to rule out acute CVA.  Patient agreeable plan of care.  Patient care signed out to oncoming provider.  Patient has been able to ambulate to the restroom from his bed with the use of a cane which is baseline for him.  FINAL CLINICAL IMPRESSION(S) / ED DIAGNOSES   Weakness   Note:  This document was prepared using Dragon voice recognition software  and may include unintentional dictation errors.   Minna Antis, MD 10/30/23 2316

## 2023-10-30 NOTE — ED Notes (Signed)
 Pt reports legs feeling heavy and headache today  pt woke up this am with sx.  Hx cva.  No slurred speech  pt ambulates with a cane

## 2023-10-30 NOTE — Anesthesia Preprocedure Evaluation (Signed)
 Anesthesia Evaluation  Patient identified by MRN, date of birth, ID band Patient awake    Reviewed: Allergy & Precautions, H&P , NPO status , Patient's Chart, lab work & pertinent test results  Airway Mallampati: II  TM Distance: >3 FB Neck ROM: full    Dental no notable dental hx.    Pulmonary neg pulmonary ROS, former smoker   Pulmonary exam normal        Cardiovascular hypertension, + Peripheral Vascular Disease (Carotid stenosis, right)  + dysrhythmias Atrial Fibrillation      Neuro/Psych negative neurological ROS  negative psych ROS   GI/Hepatic negative GI ROS, Neg liver ROS,,,  Endo/Other  diabetes    Renal/GU negative Renal ROS  negative genitourinary   Musculoskeletal   Abdominal   Peds  Hematology negative hematology ROS (+)   Anesthesia Other Findings Past Medical History: No date: Carotid stenosis, right No date: Diabetes mellitus without complication (HCC) No date: Fracture closed, humerus     Comment:  right 09/02/2016: Fracture of humeral shaft, right, closed 09/03/2016: Fracture of humerus, proximal, right, closed No date: Hyperlipidemia No date: Hypertension No date: Paroxysmal atrial fibrillation (HCC) No date: Pre-syncope No date: Stroke [facility]) No date: Vasomotor rhinitis  Past Surgical History: 06/16/2017: AMPUTATION TOE; Right     Comment:  Procedure: AMPUTATION TOE right foot 2nd toe;  Surgeon:               Nadara Mustard, MD;  Location: [facility] OR;  Service:               Orthopedics;  Laterality: Right; No date: CATARACT EXTRACTION; Left No date: CHOLECYSTECTOMY No date: cyst removal knee No date: CYST REMOVAL NECK No date: Lumbar radiculopathy No date: Lumbar stenosis 09/02/2016: ORIF HUMERUS FRACTURE; Right     Comment:  Procedure: OPEN REDUCTION INTERNAL FIXATION (ORIF) RIGHT              HUMERAL SHAFT FRACTURE;  Surgeon: Myrene Galas, MD;                Location: [facility] OR;   Service: Orthopedics;  Laterality:               Right; No date: WISDOM TOOTH EXTRACTION     Reproductive/Obstetrics negative OB ROS                              Anesthesia Physical Anesthesia Plan  ASA: 3  Anesthesia Plan: General   Post-op Pain Management: Minimal or no pain anticipated   Induction: Intravenous  PONV Risk Score and Plan: Propofol infusion and TIVA  Airway Management Planned:   Additional Equipment:   Intra-op Plan:   Post-operative Plan:   Informed Consent:      Dental Advisory Given  Plan Discussed with: CRNA and Surgeon  Anesthesia Plan Comments:          Anesthesia Quick Evaluation

## 2023-10-30 NOTE — ED Notes (Signed)
 Pt alert  pt in hallway bed  family with pt.

## 2023-10-30 NOTE — ED Triage Notes (Signed)
 First nurse note: Pt here with generalized weakness via [facility] from [facility] that started 1.5 hours ago. Pt has HX of CVA"s per EMS.    115/86 HR: 80  97% RA

## 2023-10-30 NOTE — ED Triage Notes (Signed)
 Patient to ED via [facility] from the [facility] for generalized weakness. States he feels like he is having trouble walking. Started 2 hours PTA. Hx of stroke- with left sided deficits. States both legs feel heavy. Also states "his head feels like it is vibrating." Takes blood thinners.

## 2023-10-30 NOTE — ED Notes (Signed)
 Meds given.

## 2023-10-30 NOTE — ED Notes (Signed)
 Dr Lenard Lance with pt

## 2023-10-31 DIAGNOSIS — R531 Weakness: Secondary | ICD-10-CM | POA: Diagnosis not present

## 2023-10-31 MED ORDER — HYDROCODONE-ACETAMINOPHEN 5-325 MG PO TABS
1.0000 | ORAL_TABLET | Freq: Once | ORAL | Status: AC
  Administered 2023-10-31: 1 via ORAL
  Filled 2023-10-31: qty 1

## 2023-10-31 NOTE — ED Provider Notes (Signed)
-----------------------------------------   3:02 AM on 10/31/2023 -----------------------------------------  I took over care of this patient from Dr. Lenard Lance.  MRI is negative for acute findings.  There is no evidence of an acute CVA.  The vertebral artery flow void is chronic and seen on prior imaging.  The patient has no acute symptoms related to this.  On reassessment, the patient is comfortable appearing.  Per Dr. Lenard Lance at sign out the plan was discharge home with outpatient follow-up if the MRI was negative.  The patient is in agreement with this plan.  He is stable for discharge at this time.  Return precautions given, and he expresses understanding.  MR brain:  IMPRESSION:  1. No evidence of acute intracranial abnormality.  2. Remote left cerebellar infarct.  3. 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 A CTA could  further assess if clinically warranted.     Dionne Bucy, MD 10/31/23 [phone]

## 2023-10-31 NOTE — Discharge Instructions (Addendum)
 Your MRI does not show any signs of a stroke.  Your blood work is reassuring.  Follow-up with your primary care provider.  Return to the ER for new, worsening, or persistent severe weakness, numbness, inability to walk, dizziness, severe headache, changes in your vision or speech, or any other new or worsening symptoms that concern you.

## 2023-10-31 NOTE — ED Notes (Signed)
 Dr Marisa Severin with pt now

## 2023-12-20 DIAGNOSIS — M0580 Other rheumatoid arthritis with rheumatoid factor of unspecified site: Secondary | ICD-10-CM | POA: Insufficient documentation

## 2024-01-04 ENCOUNTER — Telehealth: Payer: Self-pay | Admitting: Psychiatry

## 2024-01-04 ENCOUNTER — Other Ambulatory Visit: Payer: Self-pay

## 2024-01-04 ENCOUNTER — Telehealth: Payer: Self-pay

## 2024-01-04 ENCOUNTER — Ambulatory Visit (INDEPENDENT_AMBULATORY_CARE_PROVIDER_SITE_OTHER): Admitting: Psychiatry

## 2024-01-04 ENCOUNTER — Encounter: Payer: Self-pay | Admitting: Psychiatry

## 2024-01-04 VITALS — BP 112/80 | HR 107 | Temp 96.5°F | Ht 72.0 in | Wt 318.2 lb

## 2024-01-04 DIAGNOSIS — G4701 Insomnia due to medical condition: Secondary | ICD-10-CM | POA: Diagnosis not present

## 2024-01-04 DIAGNOSIS — G4700 Insomnia, unspecified: Secondary | ICD-10-CM | POA: Insufficient documentation

## 2024-01-04 DIAGNOSIS — F411 Generalized anxiety disorder: Secondary | ICD-10-CM | POA: Insufficient documentation

## 2024-01-04 DIAGNOSIS — G4733 Obstructive sleep apnea (adult) (pediatric): Secondary | ICD-10-CM | POA: Insufficient documentation

## 2024-01-04 MED ORDER — ESCITALOPRAM OXALATE 10 MG PO TABS
5.0000 mg | ORAL_TABLET | Freq: Every day | ORAL | Status: DC
Start: 2024-01-04 — End: 2024-03-28

## 2024-01-04 MED ORDER — NORTRIPTYLINE HCL 75 MG PO CAPS
75.0000 mg | ORAL_CAPSULE | Freq: Every day | ORAL | 1 refills | Status: DC
Start: 2024-01-04 — End: 2024-02-08

## 2024-01-04 NOTE — Telephone Encounter (Signed)
 left message to see how patient was feeling and if he had heard anything from his pcp. because pcp office was contacted and a message was left for the provider

## 2024-01-04 NOTE — Telephone Encounter (Signed)
 Noted

## 2024-01-04 NOTE — Telephone Encounter (Signed)
 I called this pt back to see how he was feeling and let him know that i left a message with his primary care office and i wondered if they had called him.

## 2024-01-04 NOTE — Telephone Encounter (Signed)
 Message left for patient to call and schedule his follow up since left office not feeling well after his initial appointment

## 2024-01-04 NOTE — Patient Instructions (Signed)
  [email or web address]  www.[email or web address]  [email or web address] [facility] [address], [place], [place] [address]   [phone]  [facility] [email or web address] [address], [place], [place] [address]  [phone]  [facility], [facility] [email or web address] [address] [place], [place] [address]  [phone]   [facility] - [phone]  [facility] - [phone]  [facility] - [phone]   [facility] [phone]  [facility] - [phone]   [email or web address] [phone]  Medicaid below :  [facility] [address] [place], [place] [address]  [phone]    Estela Held [address] [place], [place] [address]  [phone]    [facility] [address] [place], [place] [address]  [phone]

## 2024-01-04 NOTE — Telephone Encounter (Signed)
 pt called had left a message that he was feeling some better and that his pcp had not called him, and he forgot to make his follow up appt with dr Tere Felts.   I sent a message to the front desk staff to reach out to patient to set up appt.  I will call him tomorrow and if he has still not heard from pcp I will call and check on the status.

## 2024-01-04 NOTE — Telephone Encounter (Signed)
 left message asking if she could contact our office in regards to her brother justyn. (he has signed a ROI to speak with her)

## 2024-01-04 NOTE — Telephone Encounter (Signed)
 3-4 weeks is good.

## 2024-01-04 NOTE — Telephone Encounter (Signed)
 pt sister returned call. she was given the information and she was also told that pcp was also left a message.  she stated that she will call him and make sure he was doing better. she states that the doctors have done multiple test but nothing has come of it. but she did say thank you for letting her know.

## 2024-01-04 NOTE — Telephone Encounter (Signed)
 spoke with Fairy Homer, she took a urgent message because she could not find a Engineer, civil (consulting). she was told that pt had a short syncope eposide in our office as he was walking out of providers office. pt had stated that this happens all the time and that is why his legs are all messed up because he keeps passing out and falling . He told me that he had went to a local gas station and he fell out there also. Pt did sign a ROI one for his PCP and the other one to speak with his sister.  Fairy Homer was given the patient vitals. Pt has a appt in sept. Fairy Homer stated that she was sending to message and that someone would call me back.

## 2024-01-04 NOTE — Progress Notes (Signed)
 Psychiatric Initial Adult Assessment   Patient Identification: [name] MRN:  [id number] Date of Evaluation:  01/04/2024 Referral Source: Shaun Pickles FNP Chief Complaint:   Chief Complaint  Patient presents with   Establish Care   Anxiety   Insomnia   Visit Diagnosis:    ICD-10-CM   1. GAD (generalized anxiety disorder)  F41.1 escitalopram (LEXAPRO) 10 MG tablet    nortriptyline (PAMELOR) 75 MG capsule    2. Insomnia due to medical condition  G47.01 escitalopram (LEXAPRO) 10 MG tablet    nortriptyline (PAMELOR) 75 MG capsule    Ambulatory referral to Pulmonology   Anxiety , pain, OSA     Discussed the use of AI scribe software for clinical note transcription with the patient, who gave verbal consent to proceed.  History of Present Illness [name] "[name]" is a 59 year old male, single, on disability, lives in [place], has a history of hypertension, history of cerebellar stroke, left-sided numbness and weakness, history of syncope/presyncope,, atrial fibrillation, chronic pain, diabetes mellitus type 2, anxiety was evaluated in office today, presents for an intake evaluation for psychiatric concerns. He was referred by his primary care provider, Shaun Pickles, FNP, for psychiatric evaluation.  He experiences significant anxiety, describing it as a constant state of being 'jittery' and 'anxious' about everything, including this meeting. He feels like he is in a 'giant anxiety attack all the time' but does not experience discrete panic attacks. He also feels nauseated when discussing his anxiety.  His anxiety symptoms have been worsening since the past several months.  He is currently taking Lexapro for anxiety,but is unsure of their effectiveness. Lorazepam  has been prescribed for acute anxiety episodes but is not used regularly.  He is unable to elaborate if he feels depressed although he does report sleep problems, low energy, reduced appetite and so on.  He denies  any suicidality, homicidality or perceptual disturbances.  He denies any significant history of trauma.  Denies any manic or hypomanic symptoms.  Denies any obsessions or compulsive behaviors.  He has a history of diabetes mellitus type 2, with an improved A1c from 12 to 6.9. He is on Ozempic intermittently due to insurance issues. 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  He has a history of cerebellar stroke, occurring about a year and a half ago, resulting in left-sided weakness and numbness. He uses a cane for mobility and has undergone physical therapy. He reports significant pain in his hands, described as 'stinging needles' 24 hours a day, and has reduced grip strength in his left hand, which is his dominant hand.  He has been diagnosed with obstructive sleep apnea and has been given a CPAP mask, but reports issues with the mask and is not able to use it successfully. He experiences poor sleep, waking up after a few hours and unable to return to sleep. A recent sleep study indicated significant sleep apnea with 61.2 events per hour.  He reports chronic back pain due to degenerative arthritis and degenerative disc disease, which limits his ability to stand for long periods. He also has cataracts in both eyes, affecting his vision.  He has a history of presyncope and syncope, experiencing dizziness and near-fainting episodes daily. He does not drive and uses transportation services for appointments.   Associated Signs/Symptoms: Depression Symptoms:  insomnia, fatigue, difficulty concentrating, anxiety, loss of energy/fatigue, disturbed sleep, decreased appetite, (  Hypo) Manic Symptoms:  Denies Anxiety Symptoms:  Excessive Worry, Psychotic Symptoms:  Denies PTSD Symptoms: Negative  Past Psychiatric History: He has been under the care of his primary care provider  been managing his medications for his mood symptoms.  Previous trials of Lexapro, Wellbutrin.  Denies suicide attempts or self-injurious behaviors.  Denies inpatient behavioral health admissions.  Previous Psychotropic Medications: Yes as noted above.  Substance Abuse History in the last 12 months:  No.  Consequences of Substance Abuse: Negative  Past Medical History:  Past Medical History:  Diagnosis Date   Anxiety    Carotid stenosis, right    Diabetes mellitus without complication (HCC)    Fracture closed, humerus    right   Fracture of humeral shaft, right, closed 09/02/2016   Fracture of humerus, proximal, right, closed 09/03/2016   Hyperlipidemia    Hypertension    Neuropathy    Paroxysmal atrial fibrillation (HCC)    Pre-syncope    Stroke (HCC)    Vasomotor rhinitis     Past Surgical History:  Procedure Laterality Date   AMPUTATION TOE Right 06/16/2017   Procedure: AMPUTATION TOE right foot 2nd toe;  Surgeon: Timothy Ford, MD;  Location: [facility] OR;  Service: Orthopedics;  Laterality: Right;   CATARACT EXTRACTION Left    CHOLECYSTECTOMY     cyst removal knee     CYST REMOVAL NECK     Lumbar radiculopathy     Lumbar stenosis     ORIF HUMERUS FRACTURE Right 09/02/2016   Procedure: OPEN REDUCTION INTERNAL FIXATION (ORIF) RIGHT  HUMERAL SHAFT FRACTURE;  Surgeon: Hardy Lia, MD;  Location: [facility] OR;  Service: Orthopedics;  Laterality: Right;   WISDOM TOOTH EXTRACTION      Family Psychiatric History: Denies.  Family History:  Family History  Problem Relation Age of Onset   Sjogren's syndrome Mother    CAD Father    Diabetes Father    Heart disease Father    Mental illness Neg Hx     Social History:   Social History   Socioeconomic History   Marital status: Single    Spouse name: Not on file   Number of children: 0   Years of education: Not on file   Highest education level: Associate degree: occupational, Scientist, product/process development, or vocational program  Occupational  History   Not on file  Tobacco Use   Smoking status: Former    Current packs/day: 0.00    Types: Cigarettes    Quit date: 08/09/2011    Years since quitting: 12.4   Smokeless tobacco: Never  Vaping Use   Vaping status: Never Used  Substance and Sexual Activity   Alcohol use: Not Currently    Comment: socially    Drug use: Not Currently    Types: Marijuana    Comment: In High school   Sexual activity: Yes  Other Topics Concern   Not on file  Social History Narrative   Not on file   Social Drivers of Health   Financial Resource Strain: High Risk (06/20/2023)   Received from [facility]   Overall Financial Resource Strain (CARDIA)    Difficulty of Paying Living Expenses: Very hard  Food Insecurity: Medium Risk (12/17/2023)   Received from [facility]   Hunger Vital Sign    Worried About Running Out of Food in the Last Year: Sometimes true    Ran Out of Food in the Last Year: Sometimes true  Transportation Needs: Unmet Transportation Needs (12/17/2023)  Received from [facility]    In the past 12 months, has lack of reliable transportation kept you from medical appointments, meetings, work or from getting things needed for daily living? : Yes  Physical Activity: Insufficiently Active (11/01/2022)   Received from [facility] [place] [facility]   Exercise Vital Sign    Days of Exercise per Week: 3 days    Minutes of Exercise per Session: 20 min  Stress: Stress Concern Present (11/01/2022)   Received from [facility] - Occupational Stress Questionnaire    Feeling of Stress : Very much  Social Connections: Somewhat Isolated (11/01/2022)   Received from [facility]   Social Network    How would you rate your social network (family, work, friends)?: Restricted participation with some degree of social isolation    Additional Social History: Born and raised in [place] .  Was raised by both parents.  Graduated high  school.  Attended multiple colleges and eventually completed culinary school.  He used to work as a Investment banker, operational previously.  He is single.  Denies having children.  He has support from a sister who lives close by.  He lives currently in [place].  He is currently on disability.  He is spiritual.  Denies access to a gun.  Denies any current legal problems.  Allergies:   Allergies  Allergen Reactions   No Known Allergies     Metabolic Disorder Labs: Lab Results  Component Value Date   HGBA1C 5.6 02/02/2018   MPG 289 09/02/2016   No results found for: "PROLACTIN" Lab Results  Component Value Date   CHOL 235 (H) 08/14/2017   TRIG 198 (H) 08/14/2017   HDL 49 08/14/2017   CHOLHDL 4.8 08/14/2017   LDLCALC 146 (H) 08/14/2017   No results found for: "TSH"  Therapeutic Level Labs: No results found for: "LITHIUM" No results found for: "CBMZ" No results found for: "VALPROATE"  Current Medications: Current Outpatient Medications  Medication Sig Dispense Refill   apixaban (ELIQUIS) 5 MG TABS tablet Take 5 mg by mouth 2 (two) times daily.     aspirin EC 81 MG tablet Take 81 mg by mouth daily. Swallow whole.     atorvastatin  (LIPITOR) 20 MG tablet Take 1 tablet (20 mg total) by mouth daily. 90 tablet 1   Blood Glucose Monitoring Suppl (CONTOUR NEXT EZ) w/Device KIT 1 each by Does not apply route 3 (three) times daily. 1 kit 0   glucose blood (CONTOUR NEXT TEST) test strip Use as instructed 100 each 12   Insulin  Glargine (LANTUS  SOLOSTAR) 100 UNIT/ML Solostar Pen Inject 38 Units into the skin daily at 10 pm. 5 pen 11   lisinopril  (PRINIVIL ,ZESTRIL ) 10 MG tablet Take 1 tablet (10 mg total) by mouth daily. 90 tablet 1   MICROLET LANCETS MISC 1 each by Does not apply route 3 (three) times daily. 100 each 12   mupirocin  ointment (BACTROBAN ) 2 % Apply 1 application topically 2 (two) times daily. Apply to the affected area 2 times a day 22 g 3   nitroGLYCERIN  (NITRODUR - DOSED IN MG/24 HR) 0.2 mg/hr  patch Place 1 patch (0.2 mg total) onto the skin daily. 30 patch 12   nortriptyline (PAMELOR) 75 MG capsule Take 1 capsule (75 mg total) by mouth at bedtime. 30 capsule 1   pregabalin (LYRICA) 200 MG capsule Take 200 mg by mouth 3 (three) times daily.     Semaglutide,0.25 or 0.5MG /DOS, 2 MG/1.5ML SOPN Inject  0.25 mg into the skin once a week.     escitalopram  (LEXAPRO ) 10 MG tablet Take 0.5 tablets (5 mg total) by mouth daily for 7 days.     No current facility-administered medications for this visit.    Musculoskeletal: Strength & Muscle Tone: left sided weakness S/P CVA Gait & Station: walks with cane Patient leans: N/A  Psychiatric Specialty Exam: Review of Systems  Psychiatric/Behavioral:  Positive for decreased concentration and sleep disturbance. The patient is nervous/anxious.     Blood pressure 112/80, pulse (!) 107, temperature (!) 96.5 F (35.8 C), temperature source Temporal, height 6' (1.829 m), weight (!) 318 lb 3.2 oz (144.3 kg), SpO2 100%.Body mass index is 43.16 kg/m.  General Appearance: Casual  Eye Contact:  Fair  Speech:  Clear and Coherent  Volume:  Normal  Mood:  Anxious  Affect:  Congruent  Thought Process:  Goal Directed and Descriptions of Associations: Circumstantial  Orientation:  Full (Time, Place, and Person)  Thought Content:  Rumination  Suicidal Thoughts:  No  Homicidal Thoughts:  No  Memory:  Immediate;   Fair Recent;   Fair Remote;   Fair  Judgement:  Fair  Insight:  Fair  Psychomotor Activity:  Normal  Concentration:  Concentration: Fair and Attention Span: Fair  Recall:  [employer] of Knowledge:Fair  Language: Fair  Akathisia:  No  Handed:  Left  AIMS (if indicated):  not done  Assets:  Communication Skills Desire for Improvement Housing Social Support Transportation  ADL's:  Intact  Cognition: WNL  Sleep:  Poor   Screenings: GAD-7    Loss adjuster, chartered Office Visit from 01/04/2024 in [facility] Office Visit from 08/07/2017 in [facility] [place] - A Dept Of [place]. [facility] Office Visit from 12/26/2016 in [facility] [place] - A Dept Of [place]. [facility] Office Visit from 12/01/2016 in [facility] [place] - A Dept Of [place] [facility]. [facility] Office Visit from 11/09/2016 in [facility] [place] - A Dept Of [place] [facility]. [facility]  Total GAD-7 Score 17 1 3 4 4       PHQ2-9    Flowsheet Row Office Visit from 01/04/2024 in [facility] Nutrition from 08/30/2017 in [facility]  - A Dept Of [place]. [facility] Office Visit from 08/07/2017 in [facility] [place] - A Dept Of [place]. [facility] Office Visit from 12/26/2016 in [facility] [place] - A Dept Of [place] [facility]. [facility] Office Visit from 12/01/2016 in [facility] [place] - A Dept Of [place] [facility]. [facility]  PHQ-2 Total Score 4 0 1 1 2   PHQ-9 Total Score 16 -- 3 4 5       Flowsheet Row Office Visit from 01/04/2024 in [facility] ED from 10/30/2023 in [facility] Department at [facility]  C-SSRS RISK CATEGORY No Risk No Risk      Assessment and Plan:Jaceyon Bunte is a 58 year old Caucasian male with history of cerebellar stroke with left-sided weakness, obstructive sleep apnea, hypertension, atrial fibrillation, morbid obesity, diabetes mellitus with complications, chronic pain, anxiety, presented to establish care.  Discussed assessment and plan as noted below.  Assessment & Plan  Generalized anxiety disorder-unstable Currently experiencing significant anxiety mostly due to comorbid medical problems, chronic pain. - Reduce Lexapro  to  5 mg daily for a week and stop taking. - Increase Nortriptyline to 75 mg at bedtime. - Referred patient for psychotherapy.   Provided resources in the community.   Insomnia/Obstructive sleep apnea-unstable Significant obstructive sleep apnea with an apnea-hypopnea index of 61.2 events per hour. Current CPAP mask is ineffective, causing discomfort and nasal issues. Sleep is fragmented with frequent awakenings and difficulty returning to sleep. Emphasized the importance of correcting sleep apnea as it is the underlying cause of sleep disturbances. Medication is not recommended due to the risk of not waking up during apneic episodes. - Refer to pulmonology for further evaluation and management of sleep apnea. - Discontinue Trazodone due to lack of benefit. - Advise follow-up with the CPAP prescriber for mask adjustment. - Reviewed sleep study completed at [place] dated 05/14/2023-patient with severe sleep apnea.  Near-syncope Patient after completing the session with this provider had a near-syncope episode while in the hallway of the clinic.  Patient offered to be transported to the emergency department but declined.  Patient reports he has been having several near-syncope events recently and has been in communication with his providers.  Primary provider notified.  Patient's family member notified after obtaining consent from patient.  (More details regarding this in a telephone note in EHR.)  Reviewed labs dated 12/18/2023-TSH-1.013-within normal limits. Reviewed notes per neurology-Dr. Devonna Foley health-patient with history of cerebellar stroke with left-sided numbness, atrial fibrillation, was evaluated for neuropathic pain-12/04/2023.  Patient on Pregabalin and Nortriptyline. Reviewed EKG dated 10/30/2023-normal sinus rhythm, QTc, 408.    Follow-up Follow-up needed to assess response to medication changes and therapy initiation. Communication with neurologist regarding changes in Nortriptyline dosage is essential. - Schedule follow-up appointment in 4-6 weeks to evaluate medication changes and  therapy progress. - Ensure communication with neurologist regarding changes in nortriptyline dosage.   Collaboration of Care: Primary Care Provider AEB encouraged to follow up with primary care provider for near-syncope right after the session today while patient was still at the office.  (A phone note has been placed with details) and Referral or follow-up with counselor/therapist AEB provided resources in the community.  Patient also referred to a pulmonologist for CPAP problem.  Patient/Guardian was advised Release of Information must be obtained prior to any record release in order to collaborate their care with an outside provider. Patient/Guardian was advised if they have not already done so to contact the registration department to sign all necessary forms in order for us  to release information regarding their care.   Consent: Patient/Guardian gives verbal consent for treatment and assignment of benefits for services provided during this visit. Patient/Guardian expressed understanding and agreed to proceed.   I have spent atleast 60 minutes face to face with patient today which includes the time spent for preparing to see the patient ( e.g., review of test, records ), obtaining and to review and separately obtained history , ordering medications and test ,psychoeducation and supportive psychotherapy and care coordination,as well as documenting clinical information in electronic health record,interpreting and communication of test results   Pearl Bottcher, MD 5/29/20258:28 PM

## 2024-01-05 NOTE — Telephone Encounter (Signed)
 left message to check on patient and to also see if pcp had call him.

## 2024-02-07 ENCOUNTER — Other Ambulatory Visit: Payer: Self-pay | Admitting: Psychiatry

## 2024-02-07 DIAGNOSIS — G4701 Insomnia due to medical condition: Secondary | ICD-10-CM

## 2024-02-07 DIAGNOSIS — F411 Generalized anxiety disorder: Secondary | ICD-10-CM

## 2024-02-28 ENCOUNTER — Inpatient Hospital Stay
Admission: EM | Admit: 2024-02-28 | Discharge: 2024-03-02 | DRG: 565 | Disposition: A | Discharge status: Home / Self Care | Attending: Internal Medicine | Admitting: Internal Medicine

## 2024-02-28 ENCOUNTER — Emergency Department

## 2024-02-28 ENCOUNTER — Other Ambulatory Visit: Payer: Self-pay

## 2024-02-28 DIAGNOSIS — Z5901 Sheltered homelessness: Secondary | ICD-10-CM | POA: Diagnosis not present

## 2024-02-28 DIAGNOSIS — R296 Repeated falls: Secondary | ICD-10-CM | POA: Diagnosis present

## 2024-02-28 DIAGNOSIS — T796XXA Traumatic ischemia of muscle, initial encounter: Secondary | ICD-10-CM | POA: Diagnosis present

## 2024-02-28 DIAGNOSIS — Z8249 Family history of ischemic heart disease and other diseases of the circulatory system: Secondary | ICD-10-CM | POA: Diagnosis not present

## 2024-02-28 DIAGNOSIS — W06XXXA Fall from bed, initial encounter: Secondary | ICD-10-CM | POA: Diagnosis present

## 2024-02-28 DIAGNOSIS — M6282 Rhabdomyolysis: Secondary | ICD-10-CM | POA: Diagnosis present

## 2024-02-28 DIAGNOSIS — Z7985 Long-term (current) use of injectable non-insulin antidiabetic drugs: Secondary | ICD-10-CM | POA: Diagnosis not present

## 2024-02-28 DIAGNOSIS — Z8673 Personal history of transient ischemic attack (TIA), and cerebral infarction without residual deficits: Secondary | ICD-10-CM | POA: Diagnosis not present

## 2024-02-28 DIAGNOSIS — F411 Generalized anxiety disorder: Secondary | ICD-10-CM | POA: Diagnosis present

## 2024-02-28 DIAGNOSIS — Z7982 Long term (current) use of aspirin: Secondary | ICD-10-CM | POA: Diagnosis not present

## 2024-02-28 DIAGNOSIS — E118 Type 2 diabetes mellitus with unspecified complications: Secondary | ICD-10-CM | POA: Diagnosis present

## 2024-02-28 DIAGNOSIS — E1142 Type 2 diabetes mellitus with diabetic polyneuropathy: Secondary | ICD-10-CM | POA: Diagnosis present

## 2024-02-28 DIAGNOSIS — R7401 Elevation of levels of liver transaminase levels: Secondary | ICD-10-CM | POA: Diagnosis present

## 2024-02-28 DIAGNOSIS — Z87891 Personal history of nicotine dependence: Secondary | ICD-10-CM | POA: Diagnosis not present

## 2024-02-28 DIAGNOSIS — I480 Paroxysmal atrial fibrillation: Secondary | ICD-10-CM | POA: Diagnosis present

## 2024-02-28 DIAGNOSIS — I10 Essential (primary) hypertension: Secondary | ICD-10-CM | POA: Diagnosis present

## 2024-02-28 DIAGNOSIS — D72829 Elevated white blood cell count, unspecified: Secondary | ICD-10-CM | POA: Diagnosis present

## 2024-02-28 DIAGNOSIS — Z794 Long term (current) use of insulin: Secondary | ICD-10-CM

## 2024-02-28 DIAGNOSIS — E785 Hyperlipidemia, unspecified: Secondary | ICD-10-CM | POA: Diagnosis present

## 2024-02-28 DIAGNOSIS — G4733 Obstructive sleep apnea (adult) (pediatric): Secondary | ICD-10-CM | POA: Diagnosis present

## 2024-02-28 DIAGNOSIS — N179 Acute kidney failure, unspecified: Secondary | ICD-10-CM | POA: Diagnosis present

## 2024-02-28 DIAGNOSIS — F419 Anxiety disorder, unspecified: Secondary | ICD-10-CM | POA: Diagnosis present

## 2024-02-28 DIAGNOSIS — Z89421 Acquired absence of other right toe(s): Secondary | ICD-10-CM | POA: Diagnosis not present

## 2024-02-28 DIAGNOSIS — W19XXXA Unspecified fall, initial encounter: Principal | ICD-10-CM

## 2024-02-28 DIAGNOSIS — Z7901 Long term (current) use of anticoagulants: Secondary | ICD-10-CM

## 2024-02-28 DIAGNOSIS — Z6841 Body Mass Index (BMI) 40.0 and over, adult: Secondary | ICD-10-CM

## 2024-02-28 DIAGNOSIS — Y92009 Unspecified place in unspecified non-institutional (private) residence as the place of occurrence of the external cause: Secondary | ICD-10-CM

## 2024-02-28 DIAGNOSIS — F32A Depression, unspecified: Secondary | ICD-10-CM | POA: Diagnosis present

## 2024-02-28 DIAGNOSIS — Z833 Family history of diabetes mellitus: Secondary | ICD-10-CM | POA: Diagnosis not present

## 2024-02-28 DIAGNOSIS — Z79899 Other long term (current) drug therapy: Secondary | ICD-10-CM

## 2024-02-28 DIAGNOSIS — E1169 Type 2 diabetes mellitus with other specified complication: Secondary | ICD-10-CM | POA: Diagnosis present

## 2024-02-28 LAB — URINALYSIS, ROUTINE W REFLEX MICROSCOPIC
Bilirubin Urine: NEGATIVE
Glucose, UA: NEGATIVE mg/dL
Ketones, ur: NEGATIVE mg/dL
Leukocytes,Ua: NEGATIVE
Nitrite: NEGATIVE
Protein, ur: 30 mg/dL — AB
Specific Gravity, Urine: 1.023 (ref 1.005–1.030)
pH: 5 (ref 5.0–8.0)

## 2024-02-28 LAB — CBC
HCT: 36.9 % — ABNORMAL LOW (ref 39.0–52.0)
Hemoglobin: 11.7 g/dL — ABNORMAL LOW (ref 13.0–17.0)
MCH: 27.9 pg (ref 26.0–34.0)
MCHC: 31.7 g/dL (ref 30.0–36.0)
MCV: 87.9 fL (ref 80.0–100.0)
Platelets: 219 K/uL (ref 150–400)
RBC: 4.2 MIL/uL — ABNORMAL LOW (ref 4.22–5.81)
RDW: 15.4 % (ref 11.5–15.5)
WBC: 16.5 K/uL — ABNORMAL HIGH (ref 4.0–10.5)
nRBC: 0 % (ref 0.0–0.2)

## 2024-02-28 LAB — HEPATIC FUNCTION PANEL
ALT: 60 U/L — ABNORMAL HIGH (ref 0–44)
AST: 142 U/L — ABNORMAL HIGH (ref 15–41)
Albumin: 3.2 g/dL — ABNORMAL LOW (ref 3.5–5.0)
Alkaline Phosphatase: 86 U/L (ref 38–126)
Bilirubin, Direct: 0.2 mg/dL (ref 0.0–0.2)
Indirect Bilirubin: 0.9 mg/dL (ref 0.3–0.9)
Total Bilirubin: 1.1 mg/dL (ref 0.0–1.2)
Total Protein: 6.7 g/dL (ref 6.5–8.1)

## 2024-02-28 LAB — BASIC METABOLIC PANEL WITH GFR
Anion gap: 14 (ref 5–15)
BUN: 64 mg/dL — ABNORMAL HIGH (ref 6–20)
CO2: 20 mmol/L — ABNORMAL LOW (ref 22–32)
Calcium: 8.3 mg/dL — ABNORMAL LOW (ref 8.9–10.3)
Chloride: 105 mmol/L (ref 98–111)
Creatinine, Ser: 2.5 mg/dL — ABNORMAL HIGH (ref 0.61–1.24)
GFR, Estimated: 29 mL/min — ABNORMAL LOW (ref >60)
Glucose, Bld: 153 mg/dL — ABNORMAL HIGH (ref 70–99)
Potassium: 5 mmol/L (ref 3.5–5.1)
Sodium: 139 mmol/L (ref 135–145)

## 2024-02-28 LAB — CK: Total CK: 9543 U/L — ABNORMAL HIGH (ref 49–397)

## 2024-02-28 LAB — CBG MONITORING, ED: Glucose-Capillary: 174 mg/dL — ABNORMAL HIGH (ref 70–99)

## 2024-02-28 LAB — TROPONIN I (HIGH SENSITIVITY)
Troponin I (High Sensitivity): 77 ng/L — ABNORMAL HIGH (ref <18)
Troponin I (High Sensitivity): 52 ng/L — ABNORMAL HIGH (ref <18)

## 2024-02-28 MED ORDER — PREGABALIN 50 MG PO CAPS
100.0000 mg | ORAL_CAPSULE | Freq: Three times a day (TID) | ORAL | Status: DC
  Administered 2024-02-28 – 2024-03-02 (×9): 100 mg via ORAL
  Filled 2024-02-28 (×9): qty 2

## 2024-02-28 MED ORDER — NITROGLYCERIN 0.2 MG/HR TD PT24: 0.2000 mg | MEDICATED_PATCH | Freq: Every day | TRANSDERMAL | Status: DC

## 2024-02-28 MED ORDER — ASPIRIN 81 MG PO TBEC
81.0000 mg | DELAYED_RELEASE_TABLET | Freq: Every day | ORAL | Status: DC
  Administered 2024-02-29 – 2024-03-02 (×3): 81 mg via ORAL
  Filled 2024-02-28 (×3): qty 1

## 2024-02-28 MED ORDER — ESCITALOPRAM OXALATE 10 MG PO TABS: 5.0000 mg | ORAL_TABLET | Freq: Every day | ORAL | Status: DC

## 2024-02-28 MED ORDER — APIXABAN 5 MG PO TABS
5.0000 mg | ORAL_TABLET | Freq: Two times a day (BID) | ORAL | Status: DC
Start: 2024-02-28 — End: 2024-03-02
  Administered 2024-02-28 – 2024-03-02 (×6): 5 mg via ORAL
  Filled 2024-02-28 (×6): qty 1

## 2024-02-28 MED ORDER — LACTATED RINGERS IV BOLUS
1000.0000 mL | Freq: Once | INTRAVENOUS | Status: AC
  Administered 2024-02-28: 1000 mL via INTRAVENOUS

## 2024-02-28 MED ORDER — ONDANSETRON HCL 4 MG PO TABS
4.0000 mg | ORAL_TABLET | Freq: Four times a day (QID) | ORAL | Status: DC | PRN
  Administered 2024-03-01: 4 mg via ORAL
  Filled 2024-02-28: qty 1

## 2024-02-28 MED ORDER — INSULIN ASPART 100 UNIT/ML IJ SOLN: 0.0000 [IU] | Freq: Every day | INTRAMUSCULAR | Status: DC

## 2024-02-28 MED ORDER — OXYCODONE HCL 5 MG PO TABS
5.0000 mg | ORAL_TABLET | Freq: Once | ORAL | Status: AC
  Administered 2024-02-28: 5 mg via ORAL
  Filled 2024-02-28: qty 1

## 2024-02-28 MED ORDER — OXYCODONE HCL 5 MG PO TABS
5.0000 mg | ORAL_TABLET | Freq: Four times a day (QID) | ORAL | Status: AC | PRN
  Administered 2024-02-29: 5 mg via ORAL
  Filled 2024-02-28: qty 1

## 2024-02-28 MED ORDER — INSULIN ASPART 100 UNIT/ML IJ SOLN
0.0000 [IU] | Freq: Three times a day (TID) | INTRAMUSCULAR | Status: DC
  Administered 2024-02-29: 1 [IU] via SUBCUTANEOUS
  Filled 2024-02-28: qty 1

## 2024-02-28 MED ORDER — ACETAMINOPHEN 325 MG PO TABS
650.0000 mg | ORAL_TABLET | Freq: Four times a day (QID) | ORAL | Status: DC | PRN
  Administered 2024-03-02: 650 mg via ORAL
  Filled 2024-02-28: qty 2

## 2024-02-28 MED ORDER — SENNOSIDES-DOCUSATE SODIUM 8.6-50 MG PO TABS: 1.0000 | ORAL_TABLET | Freq: Every evening | ORAL | Status: DC | PRN

## 2024-02-28 MED ORDER — HYDRALAZINE HCL 20 MG/ML IJ SOLN: 5.0000 mg | Freq: Four times a day (QID) | INTRAMUSCULAR | Status: DC | PRN

## 2024-02-28 MED ORDER — INSULIN GLARGINE-YFGN 100 UNIT/ML ~~LOC~~ SOLN
15.0000 [IU] | Freq: Every day | SUBCUTANEOUS | Status: AC
  Administered 2024-02-28: 15 [IU] via SUBCUTANEOUS
  Filled 2024-02-28: qty 0.15

## 2024-02-28 MED ORDER — ONDANSETRON HCL 4 MG/2ML IJ SOLN
4.0000 mg | Freq: Four times a day (QID) | INTRAMUSCULAR | Status: DC | PRN
Start: 2024-02-28 — End: 2024-03-02
  Filled 2024-02-28: qty 2

## 2024-02-28 MED ORDER — NORTRIPTYLINE HCL 25 MG PO CAPS
75.0000 mg | ORAL_CAPSULE | Freq: Every day | ORAL | Status: DC
  Administered 2024-02-28 – 2024-03-01 (×3): 75 mg via ORAL
  Filled 2024-02-28 (×4): qty 3

## 2024-02-28 MED ORDER — SODIUM CHLORIDE 0.9 % IV SOLN: INTRAVENOUS | Status: DC

## 2024-02-28 MED ORDER — ACETAMINOPHEN 650 MG RE SUPP: 650.0000 mg | Freq: Four times a day (QID) | RECTAL | Status: DC | PRN

## 2024-02-28 NOTE — Assessment & Plan Note (Signed)
 Secondary to rhabdomyolysis Status post LR 2 L bolus per EDP Continue sodium chloride  infusion at 150 mL/h, 1 day ordered Recheck CK in a.m.

## 2024-02-28 NOTE — Assessment & Plan Note (Signed)
 Patient takes 38 units of long-acting insulin  every night however in setting of acute kidney injury, I have reduced long-acting insulin  on admission to 15 units nightly one-time dose A.m. team to resume appropriate and equivalent home long-acting insulin  dose as appropriate and when the benefits outweigh the risk Insulin  SSI with at bedtime coverage ordered Goal inpatient blood glucose levels 140-180

## 2024-02-28 NOTE — H&P (Addendum)
 History and Physical   [name] FMW:[id number] DOB: Mar 26, 1966 DOA: 02/28/2024  PCP: Herlinda Burnard Garre, FNP  Patient coming from: Home via EMS  I have personally briefly reviewed patient's old medical records in [facility] EMR.  Chief Concern: Fall at home  HPI: [name] is a 58 year old male with history of morbid obesity, hypertension, hyperlipidemia, insulin -dependent diabetes mellitus, atrial fibrillation on Eliquis , who presents emergency department for chief concerns of a fall.  Vitals in the ED showed T of 98.1, rr 19, hr 99, BP 135/78, SpO2 of 86% on room air.  Serum sodium is 139, potassium 5.0, chloride 105, bicarb 20, BUN of 64, serum creatinine of 2.50, EGFR of 29, nonfasting blood glucose 153, WBC 16.5, hemoglobin 11.7, platelets of 219.  AST 142.  ALT 60.  CK was elevated 9543.  HS troponin is 77.  ED treatment: Oxycodone  5 mg p.o. one-time dose, LR 1 L bolus, followed by additional LR 1 L bolus ------------------------------------ At bedside, patient able to tell me his name, age, location, current calendar year.  He reports that last night he wanted to get on his bed and when he was climbing up to his bed, both his legs felt so weak that he just could not get up and he just slid to the floor.  He stayed there all night.  And when when he woke up he felt worsening weakness prompting EMS to be called.  He denies other trauma to his person.  He denies chest pain, shortness of breath, dysuria, hematuria, diarrhea, blood in his stool, swelling of his lower extremities.  Social history: He lives at home on his own.  He denies tobacco, EtOH, recreational drug use.  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  ROS: Constitutional: no weight change, no fever ENT/Mouth: no sore throat, no rhinorrhea Eyes: no eye pain, no vision changes Cardiovascular: no chest pain, no dyspnea,  no edema, no palpitations Respiratory: no cough, no  sputum, no wheezing Gastrointestinal: no nausea, no vomiting, no diarrhea, no constipation Genitourinary: no urinary incontinence, no dysuria, no hematuria Musculoskeletal: no arthralgias, + myalgias Skin: no skin lesions, no pruritus, Neuro: + weakness, no loss of consciousness, no syncope Psych: no anxiety, no depression, no decrease appetite Heme/Lymph: no bruising, no bleeding  ED Course: Discussed with EDP, patient requiring hospitalization for chief concerns of rhabdomyolysis and acute kidney injury.  Assessment/Plan  Principal Problem:   Rhabdomyolysis Active Problems:   Essential hypertension, benign   Type 2 diabetes mellitus with obesity (HCC)   Type 2 diabetes mellitus with complication (HCC)   Morbid obesity (HCC)   Anxiety and depression   History of CVA (cerebrovascular accident)   Hyperlipidemia   Morbid obesity with body mass index (BMI) of 40.0 to 44.9 in adult (HCC)   PAF (paroxysmal atrial fibrillation) (HCC)   GAD (generalized anxiety disorder)   OSA (obstructive sleep apnea)   AKI (acute kidney injury) (HCC)   Frequent falls   Assessment and Plan:  * Rhabdomyolysis Recheck CK in the morning Status post 2 L bolus per EDP Continue with sodium chloride  infusion at 150 mg/h, 1 day ordered Recheck BMP in a.m.  Frequent falls Unclear etiology at this time given patient presentation with rhabdomyolysis and acute kidney injury Medication was reviewed and patient is on Lyrica  200 mg 3 times daily which may have a component of polypharmacy Patient may benefit from PT, OT consultation however given patient in rhabdomyolysis with acute kidney injury,  I would defer PT, OT consultation on admission AM team to order PT, OT when the benefits outweigh the risk  AKI (acute kidney injury) (HCC) Secondary to rhabdomyolysis Status post LR 2 L bolus per EDP Continue sodium chloride  infusion at 150 mL/h, 1 day ordered Recheck CK in a.m.  OSA (obstructive sleep  apnea) CPAP nightly ordered  PAF (paroxysmal atrial fibrillation) (HCC) Home Eliquis  5 mg p.o. twice daily resumed  Morbid obesity with body mass index (BMI) of 40.0 to 44.9 in adult [facility]) This complicates overall care and prognosis.   Anxiety and depression Home nortriptyline  75 mg nightly resume  Morbid obesity (HCC) This complicates overall care and prognosis.   Type 2 diabetes mellitus with complication (HCC) Patient takes 38 units of long-acting insulin  every night however in setting of acute kidney injury, I have reduced long-acting insulin  on admission to 15 units nightly one-time dose A.m. team to resume appropriate and equivalent home long-acting insulin  dose as appropriate and when the benefits outweigh the risk Insulin  SSI with at bedtime coverage ordered Goal inpatient blood glucose levels 140-180  Essential hypertension, benign Hydralazine  5 mg IV every 6 hours as needed for SBP greater than 75, 5 days ordered  Chart reviewed.   DVT prophylaxis: Apixaban  Code Status: Full code Diet: Renal/carb modified Family Communication: No Disposition Plan: Pending clinical course Consults called: None at this time Admission status: Telemetry medical, inpatient  Past Medical History:  Diagnosis Date   Anxiety    Carotid stenosis, right    Diabetes mellitus without complication (HCC)    Fracture closed, humerus    right   Fracture of humeral shaft, right, closed 09/02/2016   Fracture of humerus, proximal, right, closed 09/03/2016   Hyperlipidemia    Hypertension    Neuropathy    Paroxysmal atrial fibrillation (HCC)    Pre-syncope    Stroke (HCC)    Vasomotor rhinitis    Past Surgical History:  Procedure Laterality Date   AMPUTATION TOE Right 06/16/2017   Procedure: AMPUTATION TOE right foot 2nd toe;  Surgeon: Harden Jerona GAILS, MD;  Location: [facility] OR;  Service: Orthopedics;  Laterality: Right;   CATARACT EXTRACTION Left    CHOLECYSTECTOMY     cyst removal knee      CYST REMOVAL NECK     Lumbar radiculopathy     Lumbar stenosis     ORIF HUMERUS FRACTURE Right 09/02/2016   Procedure: OPEN REDUCTION INTERNAL FIXATION (ORIF) RIGHT  HUMERAL SHAFT FRACTURE;  Surgeon: Ozell Bruch, MD;  Location: [facility] OR;  Service: Orthopedics;  Laterality: Right;   WISDOM TOOTH EXTRACTION     Social History:  reports that he quit smoking about 12 years ago. His smoking use included cigarettes. He has never used smokeless tobacco. He reports that he does not currently use alcohol. He reports that he does not currently use drugs after having used the following drugs: Marijuana.  Allergies  Allergen Reactions   No Known Allergies    Family History  Problem Relation Age of Onset   Sjogren's syndrome Mother    CAD Father    Diabetes Father    Heart disease Father    Mental illness Neg Hx    Family history: Family history reviewed and not pertinent.  Prior to Admission medications   Medication Sig Start Date End Date Taking? Authorizing Provider  apixaban  (ELIQUIS ) 5 MG TABS tablet Take 5 mg by mouth 2 (two) times daily.    [provider]  aspirin  EC 81 MG  tablet Take 81 mg by mouth daily. Swallow whole.    [provider]  atorvastatin  (LIPITOR) 20 MG tablet Take 1 tablet (20 mg total) by mouth daily. 02/02/18   Fleming, Zelda W, NP  Blood Glucose Monitoring Suppl (CONTOUR NEXT EZ) w/Device KIT 1 each by Does not apply route 3 (three) times daily. 08/21/17   Durenda Alston SAUNDERS, FNP  escitalopram  (LEXAPRO ) 10 MG tablet Take 0.5 tablets (5 mg total) by mouth daily for 7 days. 01/04/24 01/11/24  Eappen, Saramma, MD  glucose blood (CONTOUR NEXT TEST) test strip Use as instructed 08/21/17   [name], Mandesia R, FNP  Insulin  Glargine (LANTUS  SOLOSTAR) 100 UNIT/ML Solostar Pen Inject 38 Units into the skin daily at 10 pm. 08/07/17   Durenda Alston SAUNDERS, FNP  lisinopril  (PRINIVIL ,ZESTRIL ) 10 MG tablet Take 1 tablet (10 mg total) by mouth daily. 02/02/18   Fleming,  Zelda W, NP  MICROLET LANCETS MISC 1 each by Does not apply route 3 (three) times daily. 08/21/17   Durenda Alston SAUNDERS, FNP  mupirocin  ointment (BACTROBAN ) 2 % Apply 1 application topically 2 (two) times daily. Apply to the affected area 2 times a day 08/22/17   Harden Jerona GAILS, MD  nitroGLYCERIN  (NITRODUR - DOSED IN MG/24 HR) 0.2 mg/hr patch Place 1 patch (0.2 mg total) onto the skin daily. 08/10/17   Valdemar Rocky SAUNDERS, NP  nortriptyline  (PAMELOR ) 75 MG capsule TAKE 1 CAPSULE BY MOUTH AT BEDTIME. 02/08/24   Eappen, Saramma, MD  pregabalin  (LYRICA ) 200 MG capsule Take 200 mg by mouth 3 (three) times daily.    [provider]  Semaglutide,0.25 or 0.5MG /DOS, 2 MG/1.5ML SOPN Inject 0.25 mg into the skin once a week.    [provider]   Physical Exam: Vitals:   02/28/24 1600 02/28/24 1745 02/28/24 1750 02/28/24 1809  BP:  126/66    Pulse: (!) 101  (!) 107   Resp: 19 (!) 22 (!) 24   Temp:    98 F (36.7 C)  TempSrc:    Oral  SpO2: 99%  100%   Weight:       Constitutional: appears older than stated age, NAD, calm Eyes: PERRL, lids and conjunctivae normal ENMT: Mucous membranes are moist. Posterior pharynx clear of any exudate or lesions. Age-appropriate dentition. Hearing appropriate Neck: normal, supple, no masses, no thyromegaly Respiratory: clear to auscultation bilaterally, no wheezing, no crackles. Normal respiratory effort. No accessory muscle use.  Cardiovascular: Regular rate and rhythm, no murmurs / rubs / gallops. No extremity edema. 2+ pedal pulses. No carotid bruits.  Abdomen: Morbidly obese abdomen, no tenderness, no masses palpated, no hepatosplenomegaly. Bowel sounds positive.  Musculoskeletal: no clubbing / cyanosis. No joint deformity upper and lower extremities. Good ROM, no contractures, no atrophy. Normal muscle tone.  Skin: Multiple bilateral lower extremity skin changes consistent with scrapes and bumps with varying degrees of ecchymosis      Neurologic:  Sensation intact. Strength 5/5 in all 4.  Psychiatric: Normal judgment and insight. Alert and oriented x 3. Normal mood.   EKG: independently reviewed, showing sinus tachycardia with rate of 100, QTc 442  Chest x-ray on Admission: I personally reviewed and I agree with radiologist reading as below.  DG Chest 2 View Result Date: 02/28/2024 CLINICAL DATA:  Weakness and shortness of breath.  Fall yesterday. EXAM: CHEST - 2 VIEW COMPARISON:  05/30/2017. FINDINGS: Patient is rotated. Trachea is midline. Heart size is accentuated by technique and low lung volumes. No airspace consolidation or pleural  fluid. IMPRESSION: Low lung volumes.  No acute findings. Electronically Signed   By: Newell Eke M.D.   On: 02/28/2024 16:40   CT Cervical Spine Wo Contrast Result Date: 02/28/2024 CLINICAL DATA:  fall EXAM: CT CERVICAL SPINE WITHOUT CONTRAST TECHNIQUE: Multidetector CT imaging of the cervical spine was performed without intravenous contrast. Multiplanar CT image reconstructions were also generated. RADIATION DOSE REDUCTION: This exam was performed according to the departmental dose-optimization program which includes automated exposure control, adjustment of the mA and/or kV according to patient size and/or use of iterative reconstruction technique. COMPARISON:  None Available. FINDINGS: Alignment: Normal. Skull base and vertebrae: No acute fracture. No primary bone lesion or focal pathologic process. Soft tissues and spinal canal: No prevertebral fluid or swelling. No visible canal hematoma. Disc levels: There is mild disc space narrowing at C5-6 and C6-7. There is mild bilateral facet arthrosis at C2-3 and mild to moderate right-sided facet arthrosis at C3-4 and C4-5. Upper chest: The visualized lung apices are clear. Other: None. IMPRESSION: 1. Multilevel degenerative disc disease and facet arthropathy. No evidence of acute traumatic injury. Electronically Signed   By: Evalene Coho M.D.   On:  02/28/2024 16:00   CT Head Wo Contrast Result Date: 02/28/2024 CLINICAL DATA:  fall EXAM: CT HEAD WITHOUT CONTRAST TECHNIQUE: Contiguous axial images were obtained from the base of the skull through the vertex without intravenous contrast. RADIATION DOSE REDUCTION: This exam was performed according to the departmental dose-optimization program which includes automated exposure control, adjustment of the mA and/or kV according to patient size and/or use of iterative reconstruction technique. COMPARISON:  CT the head dated October 30, 2023. FINDINGS: Brain: Wedge-shaped encephalomalacia changes within the left cerebellar hemisphere, as before. Age-related cerebral volume loss. No evidence of hemorrhage, mass or acute cortical infarct. Vascular: Mild calcific atheromatous disease within the carotid siphons. Skull: Intact and unremarkable. Sinuses/Orbits: Clear paranasal sinuses. Status post bilateral lens replacement. Other: None. IMPRESSION: 1. 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 No evidence of acute intracranial injury. Electronically Signed   By: Evalene Coho M.D.   On: 02/28/2024 15:58   Labs on Admission: I have personally reviewed following labs  CBC: Recent Labs  Lab 02/28/24 1456  WBC 16.5*  HGB 11.7*  HCT 36.9*  MCV 87.9  PLT 219   Basic Metabolic Panel: Recent Labs  Lab 02/28/24 1456  NA 139  K 5.0  CL 105  CO2 20*  GLUCOSE 153*  BUN 64*  CREATININE 2.50*  CALCIUM  8.3*   GFR: Estimated Creatinine Clearance: 51.4 mL/min (A) (by C-G formula based on SCr of 2.5 mg/dL (H)).  Liver Function Tests: Recent Labs  Lab 02/28/24 1456  AST 142*  ALT 60*  ALKPHOS 86  BILITOT 1.1  PROT 6.7  ALBUMIN  3.2*   Cardiac Enzymes: Recent Labs  Lab 02/28/24 1456  CKTOTAL 9,543*   Urine analysis:    Component Value Date/Time   COLORURINE YELLOW (A) 10/30/2023 2230   APPEARANCEUR CLOUDY (A) 10/30/2023 2230   LABSPEC 1.026 10/30/2023 2230    PHURINE 5.0 10/30/2023 2230   GLUCOSEU NEGATIVE 10/30/2023 2230   HGBUR NEGATIVE 10/30/2023 2230   BILIRUBINUR NEGATIVE 10/30/2023 2230   KETONESUR NEGATIVE 10/30/2023 2230   PROTEINUR 30 (A) 10/30/2023 2230   NITRITE NEGATIVE 10/30/2023 2230   LEUKOCYTESUR NEGATIVE 10/30/2023 2230   This document was prepared using Dragon Voice Recognition software and may include unintentional dictation errors.  Dr. Sherre [facility]  If 7PM-7AM, please contact overnight-coverage provider  If 7AM-7PM, please contact day attending provider [email or web address]  02/28/2024, 7:25 PM

## 2024-02-28 NOTE — Assessment & Plan Note (Signed)
 Hydralazine  5 mg IV every 6 hours as needed for SBP greater than 75, 5 days ordered

## 2024-02-28 NOTE — Assessment & Plan Note (Addendum)
 Home nortriptyline  75 mg nightly resume

## 2024-02-28 NOTE — Assessment & Plan Note (Signed)
 -  This complicates overall care and prognosis.

## 2024-02-28 NOTE — ED Triage Notes (Signed)
 Pt was brought in by EMS from home due to a fall yesterday. Pt does not remember falling. Pt is on a blood thinner. Per EMS pt BP was low on scene however has since gotten better. 20g in LFA. 500ml of NS given by Ems.

## 2024-02-28 NOTE — Assessment & Plan Note (Signed)
 Recheck CK in the morning Status post 2 L bolus per EDP Continue with sodium chloride  infusion at 150 mg/h, 1 day ordered Recheck BMP in a.m.

## 2024-02-28 NOTE — Assessment & Plan Note (Signed)
 Unclear etiology at this time given patient presentation with rhabdomyolysis and acute kidney injury Medication was reviewed and patient is on Lyrica  200 mg 3 times daily which may have a component of polypharmacy Patient may benefit from PT, OT consultation however given patient in rhabdomyolysis with acute kidney injury, I would defer PT, OT consultation on admission AM team to order PT, OT when the benefits outweigh the risk

## 2024-02-28 NOTE — ED Notes (Signed)
 RN was assisting pt to use urinal w/ pt sitting on side of bed. This RN turned to get something from the desk when pt tried to stand by himself and fell. Pt did not hit head. Staff members came to assist pt back into bed.

## 2024-02-28 NOTE — Assessment & Plan Note (Signed)
 CPAP nightly ordered

## 2024-02-28 NOTE — Assessment & Plan Note (Signed)
Home Eliquis 5 mg p.o. twice daily resumed

## 2024-02-28 NOTE — Assessment & Plan Note (Deleted)
Escitalopram 5 mg daily resumed

## 2024-02-28 NOTE — Hospital Course (Signed)
 [name] is a 58 year old male with history of morbid obesity, hypertension, hyperlipidemia, insulin -dependent diabetes mellitus, atrial fibrillation on Eliquis , who presents emergency department for chief concerns of a fall.  Vitals in the ED showed T of 98.1, rr 19, hr 99, BP 135/78, SpO2 of 86% on room air.  Serum sodium is 139, potassium 5.0, chloride 105, bicarb 20, BUN of 64, serum creatinine of 2.50, EGFR of 29, nonfasting blood glucose 153, WBC 16.5, hemoglobin 11.7, platelets of 219.  AST 142.  ALT 60.  CK was elevated 9543.  HS troponin is 77.  ED treatment: Oxycodone  5 mg p.o. one-time dose, LR 1 L bolus, followed by additional LR 1 L bolus

## 2024-02-28 NOTE — ED Provider Notes (Signed)
 [facility] Provider Note    Event Date/Time   First MD Initiated Contact with Patient 02/28/24 1500     (approximate)   History   Chief Complaint Fall   HPI  [name] is a 58 y.o. male with past medical history of hypertension, diabetes, hyperlipidemia, atrial fibrillation on Eliquis , and stroke who presents to the ED complaining of fall.  Patient reports that he went to bed feeling fine last night, woke up earlier this morning on the ground beside his bed.  He describes soreness in the muscles throughout his body, was unable to get himself up off of the ground and back into bed, states he then decided to go back to sleep.  He continued to feel ill when he woke up again, was eventually able to get to the phone to call EMS.  He is not sure whether he hit his head when he fell out of bed, does describe some headache and neck pain.  He has chronic left-sided numbness and balance issues due to prior stroke, denies any new numbness or weakness.  He denies any chest pain or shortness of breath, does report recurrent near syncopal episodes, but has never had an episode similar to this morning.     Physical Exam   Triage Vital Signs: ED Triage Vitals [02/28/24 1454]  Encounter Vitals Group     BP      Girls Systolic BP Percentile      Girls Diastolic BP Percentile      Boys Systolic BP Percentile      Boys Diastolic BP Percentile      Pulse      Resp      Temp      Temp src      SpO2      Weight (!) 351 lb (159.2 kg)     Height      Head Circumference      Peak Flow      Pain Score 6     Pain Loc      Pain Education      Exclude from Growth Chart     Most recent vital signs: Vitals:   02/28/24 1504 02/28/24 1600  BP:    Pulse: 99 (!) 101  Resp: 16 19  Temp:    SpO2: 95% 99%    Constitutional: Alert and oriented. Eyes: Conjunctivae are normal. Head: Atraumatic. Nose: No congestion/rhinnorhea. Mouth/Throat: Mucous membranes are moist.   Neck: No midline cervical spine tenderness to palpation. Cardiovascular: Normal rate, regular rhythm. Grossly normal heart sounds.  2+ radial pulses bilaterally. Respiratory: Normal respiratory effort.  No retractions. Lungs CTAB.  No chest wall tenderness to palpation. Gastrointestinal: Soft and nontender. No distention. Musculoskeletal: No lower extremity tenderness nor edema.  Numerous abrasions to bilateral lower extremities of varying ages, no bony tenderness.  No upper extremity bony tenderness to palpation. Neurologic:  Normal speech and language. No gross focal neurologic deficits are appreciated.    ED Results / Procedures / Treatments   Labs (all labs ordered are listed, but only abnormal results are displayed) Labs Reviewed  BASIC METABOLIC PANEL WITH GFR - Abnormal; Notable for the following components:      Result Value   CO2 20 (*)    Glucose, Bld 153 (*)    BUN 64 (*)    Creatinine, Ser 2.50 (*)    Calcium  8.3 (*)    GFR, Estimated 29 (*)    All other components within  normal limits  CBC - Abnormal; Notable for the following components:   WBC 16.5 (*)    RBC 4.20 (*)    Hemoglobin 11.7 (*)    HCT 36.9 (*)    All other components within normal limits  HEPATIC FUNCTION PANEL - Abnormal; Notable for the following components:   Albumin  3.2 (*)    AST 142 (*)    ALT 60 (*)    All other components within normal limits  CK - Abnormal; Notable for the following components:   Total CK 9,543 (*)    All other components within normal limits  TROPONIN I (HIGH SENSITIVITY) - Abnormal; Notable for the following components:   Troponin I (High Sensitivity) 77 (*)    All other components within normal limits  URINALYSIS, ROUTINE W REFLEX MICROSCOPIC  TROPONIN I (HIGH SENSITIVITY)     EKG  ED ECG REPORT I, Carlin Palin, the attending physician, personally viewed and interpreted this ECG.   Date: 02/28/2024  EKG Time: 15:01  Rate: 100  Rhythm: sinus tachycardia   Axis: Normal  Intervals:first-degree A-V block   ST&T Change: None  RADIOLOGY CT head reviewed and interpreted by me with no hemorrhage or midline shift.  PROCEDURES:  Critical Care performed: Yes, see critical care procedure note(s)  .Critical Care  Performed by: Palin Carlin, MD Authorized by: Palin Carlin, MD   Critical care provider statement:    Critical care time (minutes):  30   Critical care time was exclusive of:  Separately billable procedures and treating other patients and teaching time   Critical care was necessary to treat or prevent imminent or life-threatening deterioration of the following conditions:  Renal failure (Rhabdomyolysis)   Critical care was time spent personally by me on the following activities:  Development of treatment plan with patient or surrogate, discussions with consultants, evaluation of patient's response to treatment, examination of patient, ordering and review of laboratory studies, ordering and review of radiographic studies, ordering and performing treatments and interventions, pulse oximetry, re-evaluation of patient's condition and review of old charts   I assumed direction of critical care for this patient from another provider in my specialty: no     Care discussed with: admitting provider      MEDICATIONS ORDERED IN ED: Medications  lactated ringers  bolus 1,000 mL (has no administration in time range)  oxyCODONE  (Oxy IR/ROXICODONE ) immediate release tablet 5 mg (has no administration in time range)  lactated ringers  bolus 1,000 mL (1,000 mLs Intravenous New Bag/Given 02/28/24 1556)     IMPRESSION / MDM / ASSESSMENT AND PLAN / ED COURSE  I reviewed the triage vital signs and the nursing notes.                              58 y.o. male with past medical history of hypertension, hyperlipidemia, diabetes, stroke, and atrial fibrillation on Eliquis  who presents to the ED following fall out of bed earlier this morning that he does  not remember.  Patient's presentation is most consistent with acute presentation with potential threat to life or bodily function.  Differential diagnosis includes, but is not limited to, arrhythmia, ACS, intracranial injury, cervical spine injury, extremity injury, anemia, AKI, electrolyte abnormality, rhabdomyolysis, UTI, pneumonia.  Patient nontoxic-appearing and in no acute distress, vital signs initially remarkable for oxygen saturation in the mid 80s, now improved on 2 L nasal cannula.  He denies any chest pain or difficulty breathing, EKG shows  no evidence of arrhythmia or ischemia.  We will check CT head and cervical spine for possible traumatic injury, no focal neurologic deficits on exam to suggest stroke.  He has no evidence of bony injury to his extremities, does have numerous abrasions of varying ages to both legs and reports significant muscular pain, will check CK level.  Labs do show significant AKI without acute electrolyte abnormality, troponin mildly elevated and will trend, but suspect this is due to AKI.  He does have a leukocytosis with mild anemia, will check for infection with chest x-ray and urinalysis but no findings concerning for sepsis.  CT head and cervical spine are negative for acute process, chest x-ray also unremarkable.  Patient does have significantly elevated CK level at over 9000, likely contributing to his AKI.  We will give additional IV fluids and case discussed with hospitalist for admission.      FINAL CLINICAL IMPRESSION(S) / ED DIAGNOSES   Final diagnoses:  Fall, initial encounter  Non-traumatic rhabdomyolysis  AKI (acute kidney injury) (HCC)     Rx / DC Orders   ED Discharge Orders     None        Note:  This document was prepared using Dragon voice recognition software and may include unintentional dictation errors.   Willo Dunnings, MD 02/28/24 [phone]

## 2024-02-29 DIAGNOSIS — T796XXA Traumatic ischemia of muscle, initial encounter: Secondary | ICD-10-CM | POA: Diagnosis not present

## 2024-02-29 LAB — CBC
HCT: 38.4 % — ABNORMAL LOW (ref 39.0–52.0)
Hemoglobin: 11.8 g/dL — ABNORMAL LOW (ref 13.0–17.0)
MCH: 27.1 pg (ref 26.0–34.0)
MCHC: 30.7 g/dL (ref 30.0–36.0)
MCV: 88.1 fL (ref 80.0–100.0)
Platelets: 187 K/uL (ref 150–400)
RBC: 4.36 MIL/uL (ref 4.22–5.81)
RDW: 15.4 % (ref 11.5–15.5)
WBC: 11.5 K/uL — ABNORMAL HIGH (ref 4.0–10.5)
nRBC: 0 % (ref 0.0–0.2)

## 2024-02-29 LAB — BASIC METABOLIC PANEL WITH GFR
Anion gap: 9 (ref 5–15)
BUN: 54 mg/dL — ABNORMAL HIGH (ref 6–20)
CO2: 20 mmol/L — ABNORMAL LOW (ref 22–32)
Calcium: 7.7 mg/dL — ABNORMAL LOW (ref 8.9–10.3)
Chloride: 110 mmol/L (ref 98–111)
Creatinine, Ser: 1.67 mg/dL — ABNORMAL HIGH (ref 0.61–1.24)
GFR, Estimated: 48 mL/min — ABNORMAL LOW (ref >60)
Glucose, Bld: 155 mg/dL — ABNORMAL HIGH (ref 70–99)
Potassium: 4.4 mmol/L (ref 3.5–5.1)
Sodium: 139 mmol/L (ref 135–145)

## 2024-02-29 LAB — CK: Total CK: 8722 U/L — ABNORMAL HIGH (ref 49–397)

## 2024-02-29 LAB — GLUCOSE, CAPILLARY
Glucose-Capillary: 144 mg/dL — ABNORMAL HIGH (ref 70–99)
Glucose-Capillary: 169 mg/dL — ABNORMAL HIGH (ref 70–99)
Glucose-Capillary: 149 mg/dL — ABNORMAL HIGH (ref 70–99)
Glucose-Capillary: 128 mg/dL — ABNORMAL HIGH (ref 70–99)

## 2024-02-29 LAB — HEMOGLOBIN A1C
Hgb A1c MFr Bld: 6.9 % — ABNORMAL HIGH (ref 4.8–5.6)
Mean Plasma Glucose: 151.33 mg/dL

## 2024-02-29 MED ORDER — METHOCARBAMOL 500 MG PO TABS
1000.0000 mg | ORAL_TABLET | Freq: Three times a day (TID) | ORAL | Status: DC
  Administered 2024-02-29 – 2024-03-02 (×8): 1000 mg via ORAL
  Filled 2024-02-29 (×8): qty 2

## 2024-02-29 MED ORDER — HYDROCODONE-ACETAMINOPHEN 5-325 MG PO TABS
1.0000 | ORAL_TABLET | Freq: Four times a day (QID) | ORAL | Status: DC
  Administered 2024-02-29 – 2024-03-01 (×5): 1 via ORAL
  Filled 2024-02-29 (×5): qty 1

## 2024-02-29 MED ORDER — LACOSAMIDE 50 MG PO TABS
150.0000 mg | ORAL_TABLET | Freq: Two times a day (BID) | ORAL | Status: DC
  Administered 2024-02-29 – 2024-03-02 (×5): 150 mg via ORAL
  Filled 2024-02-29 (×5): qty 3

## 2024-02-29 MED ORDER — INSULIN GLARGINE-YFGN 100 UNIT/ML ~~LOC~~ SOLN: 20.0000 [IU] | Freq: Every day | SUBCUTANEOUS | Status: DC

## 2024-02-29 MED ORDER — INSULIN GLARGINE-YFGN 100 UNIT/ML ~~LOC~~ SOLN
15.0000 [IU] | Freq: Every day | SUBCUTANEOUS | Status: DC
  Administered 2024-02-29 – 2024-03-01 (×2): 15 [IU] via SUBCUTANEOUS
  Filled 2024-02-29 (×3): qty 0.15

## 2024-02-29 MED ORDER — CALCIUM CARBONATE ANTACID 500 MG PO CHEW
200.0000 mg | CHEWABLE_TABLET | Freq: Two times a day (BID) | ORAL | Status: DC | PRN
  Administered 2024-03-01 (×2): 200 mg via ORAL
  Filled 2024-02-29 (×3): qty 1

## 2024-02-29 MED ORDER — SODIUM CHLORIDE 0.9 % IV SOLN: INTRAVENOUS | Status: DC

## 2024-02-29 MED ORDER — INSULIN ASPART 100 UNIT/ML IJ SOLN
5.0000 [IU] | Freq: Three times a day (TID) | INTRAMUSCULAR | Status: DC
  Administered 2024-02-29 – 2024-03-02 (×6): 5 [IU] via SUBCUTANEOUS
  Filled 2024-02-29 (×6): qty 1

## 2024-02-29 MED ORDER — FAMOTIDINE 20 MG PO TABS
20.0000 mg | ORAL_TABLET | Freq: Every day | ORAL | Status: DC
  Administered 2024-02-29 – 2024-03-02 (×3): 20 mg via ORAL
  Filled 2024-02-29 (×3): qty 1

## 2024-02-29 NOTE — Progress Notes (Addendum)
 PROGRESS NOTE    [name]  FMW:[id number] DOB: 1966/05/21 DOA: 02/28/2024 PCP: Herlinda Burnard Garre, FNP    Assessment & Plan:   Principal Problem:   Rhabdomyolysis Active Problems:   Essential hypertension, benign   Type 2 diabetes mellitus with obesity (HCC)   Type 2 diabetes mellitus with complication (HCC)   Morbid obesity (HCC)   Anxiety and depression   History of CVA (cerebrovascular accident)   Hyperlipidemia   Morbid obesity with body mass index (BMI) of 40.0 to 44.9 in adult (HCC)   PAF (paroxysmal atrial fibrillation) (HCC)   GAD (generalized anxiety disorder)   OSA (obstructive sleep apnea)   AKI (acute kidney injury) (HCC)   Frequent falls  Assessment and Plan: Rhabdomyolysis: likely traumatic, secondary to frequent falls. CK is still elevated but trending down. Continue on IVFs  HLD: holding home statin secondary to rhabdomyolysis   Frequent falls: etiology unclear. PT/OT consulted    AKI: likely secondary to rhabdomyolysis. Cr is trending down today. Continue on IVFs  Leukocytosis: likely reactive. Will continue to monitor   Transaminitis: etiology unclear. Holding statin. Will continue to monitor    OSA: CPAP qhs   PAF: continue on eliquis    Depression: severity unknown. Continue on home dose of nortriptyline     Morbid obesity: BMI 47.6. Complicates overall care & prognosis    DM2: well controlled, HbA1c 6.9 Continue on glargine, aspart, SSI w/ accuchecks    HTN: holding home dose of lisinopril  secondary to AKI          DVT prophylaxis: eliquis  Code Status: full  Family Communication:  Disposition Plan: depends on PT/OT recs  Level of care: Telemetry Medical Consultants:    Procedures:  Antimicrobials:     Subjective: Pt c/o muscle aches  Objective: Vitals:   02/29/24 0230 02/29/24 0400 02/29/24 0647 02/29/24 0748  BP: 116/70 133/85  130/62  Pulse: (!) 108 99  98  Resp: 17 17  17   Temp: 98.1 F (36.7 C)  98.1 F  (36.7 C) 97.8 F (36.6 C)  TempSrc: Oral  Oral Oral  SpO2: 95% 99%  100%  Weight:        Intake/Output Summary (Last 24 hours) at 02/29/2024 1005 Last data filed at 02/28/2024 2006 Gross per 24 hour  Intake 2000 ml  Output --  Net 2000 ml   Filed Weights   02/28/24 1454  Weight: (!) 159.2 kg    Examination:  General exam: Appears calm but uncomfortable. Morbidly obese Respiratory system: Clear to auscultation. Respiratory effort normal. Cardiovascular system: S1 & S2 +. No rubs, gallops or clicks.  Gastrointestinal system: Abdomen is obese, soft and nontender.  Normal bowel sounds heard. Central nervous system: Alert and oriented. Moves all extremities Psychiatry: Judgement and insight appear normal. Mood & affect appropriate.     Data Reviewed: I have personally reviewed following labs and imaging studies  CBC: Recent Labs  Lab 02/28/24 1456 02/29/24 0432  WBC 16.5* 11.5*  HGB 11.7* 11.8*  HCT 36.9* 38.4*  MCV 87.9 88.1  PLT 219 187   Basic Metabolic Panel: Recent Labs  Lab 02/28/24 1456 02/29/24 0432  NA 139 139  K 5.0 4.4  CL 105 110  CO2 20* 20*  GLUCOSE 153* 155*  BUN 64* 54*  CREATININE 2.50* 1.67*  CALCIUM  8.3* 7.7*   GFR: Estimated Creatinine Clearance: 77 mL/min (A) (by C-G formula based on SCr of 1.67 mg/dL (H)). Liver Function Tests: Recent Labs  Lab 02/28/24 1456  AST 142*  ALT 60*  ALKPHOS 86  BILITOT 1.1  PROT 6.7  ALBUMIN  3.2*   No results for input(s): LIPASE, AMYLASE in the last 168 hours. No results for input(s): AMMONIA in the last 168 hours. Coagulation Profile: No results for input(s): INR, PROTIME in the last 168 hours. Cardiac Enzymes: Recent Labs  Lab 02/28/24 1456 02/29/24 0432  CKTOTAL 9,543* 8,722*   BNP (last 3 results) No results for input(s): PROBNP in the last 8760 hours. HbA1C: No results for input(s): HGBA1C in the last 72 hours. CBG: Recent Labs  Lab 02/28/24 2149 02/29/24 0842   GLUCAP 174* 144*   Lipid Profile: No results for input(s): CHOL, HDL, LDLCALC, TRIG, CHOLHDL, LDLDIRECT in the last 72 hours. Thyroid Function Tests: No results for input(s): TSH, T4TOTAL, FREET4, T3FREE, THYROIDAB in the last 72 hours. Anemia Panel: No results for input(s): VITAMINB12, FOLATE, FERRITIN, TIBC, IRON, RETICCTPCT in the last 72 hours. Sepsis Labs: No results for input(s): PROCALCITON, LATICACIDVEN in the last 168 hours.  No results found for this or any previous visit (from the past 240 hours).       Radiology Studies: DG Chest 2 View Result Date: 02/28/2024 CLINICAL DATA:  Weakness and shortness of breath.  Fall yesterday. EXAM: CHEST - 2 VIEW COMPARISON:  05/30/2017. FINDINGS: Patient is rotated. Trachea is midline. Heart size is accentuated by technique and low lung volumes. No airspace consolidation or pleural fluid. IMPRESSION: Low lung volumes.  No acute findings. Electronically Signed   By: Newell Eke M.D.   On: 02/28/2024 16:40   CT Cervical Spine Wo Contrast Result Date: 02/28/2024 CLINICAL DATA:  fall EXAM: CT CERVICAL SPINE WITHOUT CONTRAST TECHNIQUE: Multidetector CT imaging of the cervical spine was performed without intravenous contrast. Multiplanar CT image reconstructions were also generated. RADIATION DOSE REDUCTION: This exam was performed according to the departmental dose-optimization program which includes automated exposure control, adjustment of the mA and/or kV according to patient size and/or use of iterative reconstruction technique. COMPARISON:  None Available. FINDINGS: Alignment: Normal. Skull base and vertebrae: No acute fracture. No primary bone lesion or focal pathologic process. Soft tissues and spinal canal: No prevertebral fluid or swelling. No visible canal hematoma. Disc levels: There is mild disc space narrowing at C5-6 and C6-7. There is mild bilateral facet arthrosis at C2-3 and mild to moderate  right-sided facet arthrosis at C3-4 and C4-5. Upper chest: The visualized lung apices are clear. Other: None. IMPRESSION: 1. Multilevel degenerative disc disease and facet arthropathy. No evidence of acute traumatic injury. Electronically Signed   By: Evalene Coho M.D.   On: 02/28/2024 16:00   CT Head Wo Contrast Result Date: 02/28/2024 CLINICAL DATA:  fall EXAM: CT HEAD WITHOUT CONTRAST TECHNIQUE: Contiguous axial images were obtained from the base of the skull through the vertex without intravenous contrast. RADIATION DOSE REDUCTION: This exam was performed according to the departmental dose-optimization program which includes automated exposure control, adjustment of the mA and/or kV according to patient size and/or use of iterative reconstruction technique. COMPARISON:  CT the head dated October 30, 2023. FINDINGS: Brain: Wedge-shaped encephalomalacia changes within the left cerebellar hemisphere, as before. Age-related cerebral volume loss. No evidence of hemorrhage, mass or acute cortical infarct. Vascular: Mild calcific atheromatous disease within the carotid siphons. Skull: Intact and unremarkable. Sinuses/Orbits: Clear paranasal sinuses. Status post bilateral lens replacement. Other: None. IMPRESSION: 1. 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 No evidence of acute intracranial injury. Electronically Signed   By: Evalene Coho HERO.D.  On: 02/28/2024 15:58        Scheduled Meds:  apixaban   5 mg Oral BID   aspirin  EC  81 mg Oral Daily   HYDROcodone -acetaminophen   1 tablet Oral QID   insulin  aspart  0-5 Units Subcutaneous QHS   insulin  aspart  0-9 Units Subcutaneous TID WC   lacosamide   150 mg Oral BID   methocarbamol   1,000 mg Oral TID   nortriptyline   75 mg Oral QHS   pregabalin   100 mg Oral TID   Continuous Infusions:  sodium chloride  150 mL/hr at 02/29/24 0357     LOS: 1 day      Anthony CHRISTELLA Pouch, MD [facility] Pager [phone]  [phone]  If 7PM-7AM, please contact night-coverage [email or web address] 02/29/2024, 10:05 AM

## 2024-02-29 NOTE — Evaluation (Signed)
 Physical Therapy Evaluation Patient Details Name: [name] MRN: [id number] DOB: 1966-07-09 Today's Date: 02/29/2024  History of Present Illness  58 year old male with history of morbid obesity, hypertension, hyperlipidemia, insulin -dependent diabetes mellitus, atrial fibrillation on Eliquis , who presents emergency department after a fall, pt was down all night.  Clinical Impression  Pt pleasant and motivated, showed some impulsivity but overall moving reasonably well with walker.  He reports he uses a SPC at baseline and he was unsafe with this today.  Pt to/from bathroom a few times (Able to rise from low commode with heavy grab bar assist).  Pt fatigued considerably with 100 ft of ambulation, SpO2 in the high 90s, HR to 110s.  Pt reports he has nearly daily falls 2/2 pre-syncope/syncope.  Pt will benefit from continued PT to address functional limitations and facilitate safe d/c planning.         If plan is discharge home, recommend the following: A little help with walking and/or transfers;Assistance with cooking/housework;Assist for transportation   Can travel by private vehicle        Equipment Recommendations BSC/3in1  Recommendations for Other Services       Functional Status Assessment Patient has had a recent decline in their functional status and demonstrates the ability to make significant improvements in function in a reasonable and predictable amount of time.     Precautions / Restrictions Precautions Precautions: Fall Restrictions Weight Bearing Restrictions Per Provider Order: No      Mobility  Bed Mobility Overal bed mobility: Modified Independent                  Transfers Overall transfer level: Needs assistance Equipment used: Rolling walker (2 wheels), None Transfers: Sit to/from Stand Sit to Stand: Contact guard assist           General transfer comment: heavy reliance on UEs on multiple sit to stand efforts, showed some sway/instability  w/o UEs, good control with RW    Ambulation/Gait Ambulation/Gait assistance: Contact guard assist, Min assist Gait Distance (Feet): 100 Feet Assistive device: Rolling walker (2 wheels)         General Gait Details: Initial attempt to ambulate with (baseline) SPC, pt too unsteady, needing minA to maintain balance.  Pt much safer and more controlled with walker, still needing consistent cuing for appropriate speed, posturing and general safety awareness  Stairs            Wheelchair Mobility     Tilt Bed    Modified Rankin (Stroke Patients Only)       Balance Overall balance assessment: Needs assistance Sitting-balance support: No upper extremity supported Sitting balance-Leahy Scale: Good     Standing balance support: Bilateral upper extremity supported Standing balance-Leahy Scale: Fair                               Pertinent Vitals/Pain Pain Assessment Pain Assessment: 0-10 Pain Score: 5  Pain Location: sore all over from being on the floor, L foot and chronic back    Home Living Family/patient expects to be discharged to:: Other (Comment) (hotel) Living Arrangements: Alone Available Help at Discharge:  (sister lives locally and does assist t/o the week) Type of Home:  (hotel) Home Access: Level entry         Home Equipment: Agricultural consultant (2 wheels);Cane - single point;Grab bars - tub/shower      Prior Function Prior Level of Function : Needs assist  Mobility Comments: Pt does not drive but is out of the home QD, uses SPC, reports he has had 100s of falls in the last 6 months       Extremity/Trunk Assessment   Upper Extremity Assessment Upper Extremity Assessment: Overall WFL for tasks assessed (L minimally weaker than R)    Lower Extremity Assessment Lower Extremity Assessment: Overall WFL for tasks assessed (L minimally weaker than R)       Communication   Communication Communication: No apparent  difficulties    Cognition Arousal: Alert Behavior During Therapy: Impulsive, WFL for tasks assessed/performed   PT - Cognitive impairments: No apparent impairments                         Following commands: Intact       Cueing Cueing Techniques: Verbal cues     General Comments General comments (skin integrity, edema, etc.): Pt very fatigued with 100 ft of ambulation, but determined to reach that goal...  pt states he falls almost daily 2/2 syncopal episodes    Exercises     Assessment/Plan    PT Assessment Patient needs continued PT services  PT Problem List Decreased strength;Decreased range of motion;Decreased activity tolerance;Decreased balance;Decreased mobility;Decreased knowledge of use of DME;Decreased safety awareness;Decreased knowledge of precautions;Cardiopulmonary status limiting activity;Pain       PT Treatment Interventions DME instruction;Gait training;Stair training;Functional mobility training;Therapeutic activities;Therapeutic exercise;Balance training;Patient/family education;Neuromuscular re-education    PT Goals (Current goals can be found in the Care Plan section)  Acute Rehab PT Goals Patient Stated Goal: lose weight/be more active PT Goal Formulation: With patient Time For Goal Achievement: 03/13/24 Potential to Achieve Goals: Good    Frequency Min 2X/week     Co-evaluation               AM-PAC PT 6 Clicks Mobility  Outcome Measure Help needed turning from your back to your side while in a flat bed without using bedrails?: None Help needed moving from lying on your back to sitting on the side of a flat bed without using bedrails?: None Help needed moving to and from a bed to a chair (including a wheelchair)?: A Little Help needed standing up from a chair using your arms (e.g., wheelchair or bedside chair)?: A Little Help needed to walk in hospital room?: A Little Help needed climbing 3-5 steps with a railing? : A Lot 6  Click Score: 19    End of Session Equipment Utilized During Treatment: Gait belt Activity Tolerance: Patient tolerated treatment well;Patient limited by fatigue Patient left: with chair alarm set;with call bell/phone within reach Nurse Communication: Mobility status PT Visit Diagnosis: Muscle weakness (generalized) (M62.81);Difficulty in walking, not elsewhere classified (R26.2);History of falling (Z91.81)    Time: 8387-8348 PT Time Calculation (min) (ACUTE ONLY): 39 min   Charges:   PT Evaluation $PT Eval Low Complexity: 1 Low PT Treatments $Gait Training: 8-22 mins $Therapeutic Activity: 8-22 mins PT General Charges $$ ACUTE PT VISIT: 1 Visit         Carmin JONELLE Deed, DPT 02/29/2024, 6:26 PM

## 2024-02-29 NOTE — Plan of Care (Signed)
   Problem: Education: Goal: Ability to describe self-care measures that may prevent or decrease complications (Diabetes Survival Skills Education) will improve Outcome: Progressing Goal: Individualized Educational Video(s) Outcome: Progressing   Problem: Coping: Goal: Ability to adjust to condition or change in health will improve Outcome: Progressing

## 2024-03-01 DIAGNOSIS — T796XXA Traumatic ischemia of muscle, initial encounter: Secondary | ICD-10-CM | POA: Diagnosis not present

## 2024-03-01 LAB — CBC
HCT: 32.9 % — ABNORMAL LOW (ref 39.0–52.0)
Hemoglobin: 10.6 g/dL — ABNORMAL LOW (ref 13.0–17.0)
MCH: 27.6 pg (ref 26.0–34.0)
MCHC: 32.2 g/dL (ref 30.0–36.0)
MCV: 85.7 fL (ref 80.0–100.0)
Platelets: 192 K/uL (ref 150–400)
RBC: 3.84 MIL/uL — ABNORMAL LOW (ref 4.22–5.81)
RDW: 15.3 % (ref 11.5–15.5)
WBC: 9.3 K/uL (ref 4.0–10.5)
nRBC: 0 % (ref 0.0–0.2)

## 2024-03-01 LAB — GLUCOSE, CAPILLARY
Glucose-Capillary: 174 mg/dL — ABNORMAL HIGH (ref 70–99)
Glucose-Capillary: 149 mg/dL — ABNORMAL HIGH (ref 70–99)
Glucose-Capillary: 141 mg/dL — ABNORMAL HIGH (ref 70–99)
Glucose-Capillary: 148 mg/dL — ABNORMAL HIGH (ref 70–99)

## 2024-03-01 LAB — COMPREHENSIVE METABOLIC PANEL WITH GFR
ALT: 52 U/L — ABNORMAL HIGH (ref 0–44)
AST: 106 U/L — ABNORMAL HIGH (ref 15–41)
Albumin: 2.8 g/dL — ABNORMAL LOW (ref 3.5–5.0)
Alkaline Phosphatase: 70 U/L (ref 38–126)
Anion gap: 8 (ref 5–15)
BUN: 29 mg/dL — ABNORMAL HIGH (ref 6–20)
CO2: 24 mmol/L (ref 22–32)
Calcium: 8.6 mg/dL — ABNORMAL LOW (ref 8.9–10.3)
Chloride: 111 mmol/L (ref 98–111)
Creatinine, Ser: 0.88 mg/dL (ref 0.61–1.24)
GFR, Estimated: >60 mL/min (ref >60)
Glucose, Bld: 132 mg/dL — ABNORMAL HIGH (ref 70–99)
Potassium: 3.9 mmol/L (ref 3.5–5.1)
Sodium: 143 mmol/L (ref 135–145)
Total Bilirubin: 0.7 mg/dL (ref 0.0–1.2)
Total Protein: 6 g/dL — ABNORMAL LOW (ref 6.5–8.1)

## 2024-03-01 LAB — CK: Total CK: 4537 U/L — ABNORMAL HIGH (ref 49–397)

## 2024-03-01 MED ORDER — OXYCODONE HCL 5 MG PO TABS
10.0000 mg | ORAL_TABLET | Freq: Four times a day (QID) | ORAL | Status: DC | PRN
  Administered 2024-03-01 – 2024-03-02 (×4): 10 mg via ORAL
  Filled 2024-03-01 (×5): qty 2

## 2024-03-01 NOTE — Plan of Care (Signed)
  Problem: Education: Goal: Ability to describe self-care measures that may prevent or decrease complications (Diabetes Survival Skills Education) will improve Outcome: Progressing   Problem: Nutritional: Goal: Maintenance of adequate nutrition will improve Outcome: Progressing   Problem: Activity: Goal: Risk for activity intolerance will decrease Outcome: Progressing   Problem: Nutrition: Goal: Adequate nutrition will be maintained Outcome: Progressing

## 2024-03-01 NOTE — Progress Notes (Signed)
 Physical Therapy Treatment Patient Details Name: [name] MRN: [id number] DOB: 25-Feb-1966 Today's Date: 03/01/2024   History of Present Illness Pt is a 58 year old male with history of morbid obesity, hypertension, hyperlipidemia, insulin -dependent diabetes mellitus, atrial fibrillation on Eliquis , who presents emergency department after a fall with MD assessment that includes rhabdomyolysis, AKI, and frequent falls.    PT Comments  Pt was pleasant and motivated to participate during the session and put forth good effort throughout. Pt demonstrated good eccentric and concentric control during sit to/from stand transfers from the recliner with use of BUE support on the arm rests.  Pt able to maintain static standing without UE support but required min to mod lean on the RW for support during ambulation.  Pt ambulated with standard RW initially with BRW assessed this session and with pt stating her preferred the BRW.  Pt generally steady with gait but did require several short static standing rest breaks secondary to back pain.  Pt will benefit from continued PT services upon discharge to safely address deficits listed in patient problem list for decreased caregiver assistance and eventual return to PLOF.      If plan is discharge home, recommend the following: A little help with walking and/or transfers;Assistance with cooking/housework;Assist for transportation   Can travel by private vehicle        Equipment Recommendations  BSC/3in1;Other (comment) (bariatric BSC and RW)    Recommendations for Other Services       Precautions / Restrictions Precautions Precautions: Fall Restrictions Weight Bearing Restrictions Per Provider Order: No     Mobility  Bed Mobility               General bed mobility comments: NT, in recliner pre-post session    Transfers Overall transfer level: Needs assistance Equipment used: Rolling walker (2 wheels) Transfers: Sit to/from Stand Sit  to Stand: Supervision           General transfer comment: Mod A from UEs on arm rests but steady with good eccentric control    Ambulation/Gait Ambulation/Gait assistance: Contact guard assist Gait Distance (Feet): 150 Feet (3 therapeutic standing rest breaks each around 15 sec mostly secondary to back pain per patient) Assistive device:  (BRW) Gait Pattern/deviations: Step-through pattern, Wide base of support, Decreased step length - right, Decreased step length - left Gait velocity: decreased     General Gait Details: Mildly reduced cadence with wide BOS but steady without overt LOB   Stairs             Wheelchair Mobility     Tilt Bed    Modified Rankin (Stroke Patients Only)       Balance Overall balance assessment: Needs assistance   Sitting balance-Leahy Scale: Normal     Standing balance support: Bilateral upper extremity supported, During functional activity, Reliant on assistive device for balance Standing balance-Leahy Scale: Fair Standing balance comment: Min to mod lean on the RW for support during amb but steady with no overt LOB                            Communication Communication Communication: No apparent difficulties  Cognition Arousal: Alert Behavior During Therapy: Impulsive, WFL for tasks assessed/performed   PT - Cognitive impairments: No apparent impairments                         Following commands: Intact  Cueing Cueing Techniques: Verbal cues  Exercises      General Comments        Pertinent Vitals/Pain Pain Assessment Pain Assessment: 0-10 Pain Score: 5  Pain Location: chronic back pain Pain Descriptors / Indicators: Sore Pain Intervention(s): Premedicated before session, Monitored during session    Home Living Family/patient expects to be discharged to:: Other (Comment) (hotel) Living Arrangements: Alone Available Help at Discharge:  (sister lives nearby and can help as needed)    Home Access: Level entry         Home Equipment: Agricultural consultant (2 wheels);Cane - single point;Grab bars - tub/shower      Prior Function            PT Goals (current goals can now be found in the care plan section) Progress towards PT goals: Progressing toward goals    Frequency    Min 2X/week      PT Plan      Co-evaluation              AM-PAC PT 6 Clicks Mobility   Outcome Measure  Help needed turning from your back to your side while in a flat bed without using bedrails?: None Help needed moving from lying on your back to sitting on the side of a flat bed without using bedrails?: None Help needed moving to and from a bed to a chair (including a wheelchair)?: A Little Help needed standing up from a chair using your arms (e.g., wheelchair or bedside chair)?: A Little Help needed to walk in hospital room?: A Little Help needed climbing 3-5 steps with a railing? : A Lot 6 Click Score: 19    End of Session Equipment Utilized During Treatment: Gait belt Activity Tolerance: Patient tolerated treatment well Patient left: with call bell/phone within reach;with nursing/sitter in room;in chair Nurse Communication: Mobility status PT Visit Diagnosis: Muscle weakness (generalized) (M62.81);Difficulty in walking, not elsewhere classified (R26.2);History of falling (Z91.81)     Time: 8870-8850 PT Time Calculation (min) (ACUTE ONLY): 20 min  Charges:    $Gait Training: 8-22 mins PT General Charges $$ ACUTE PT VISIT: 1 Visit                     D. Scott Alyssia Heese PT, DPT 03/01/24, 2:50 PM

## 2024-03-01 NOTE — Evaluation (Signed)
 Occupational Therapy Evaluation Patient Details Name: [name] MRN: [id number] DOB: [birth year] Today's Date: 03/01/2024   History of Present Illness   Pt is a 58 year old male with history of morbid obesity, hypertension, hyperlipidemia, insulin -dependent diabetes mellitus, atrial fibrillation on Eliquis , who presents emergency department after a fall with MD assessment that includes rhabdomyolysis, AKI, and frequent falls.     Clinical Impressions Patient presenting with decreased Ind in self care,balance, functional mobility/transfers, endurance, and safety awareness. Patient reports being Mod I at baseline with use of SPC for mobility. Pt's sister living nearby and has helped with some errands and IADL type tasks as needed. Pt performing bed mobility without physical assistance. He is very verbose this session but appropriate and pleasant. Pt reports having just finished with shower prior to therapist arrival. Pt ambulates with RW with min guard progressing to supervision 150' and returns to room and sits up in recliner chair. Call bell and all needed items within reach.  Patient will benefit from acute OT to increase overall independence in the areas of ADLs, functional mobility, and safety awareness in order to safely discharge.      If plan is discharge home, recommend the following:   Help with stairs or ramp for entrance;A little help with bathing/dressing/bathroom     Functional Status Assessment   Patient has had a recent decline in their functional status and demonstrates the ability to make significant improvements in function in a reasonable and predictable amount of time.     Equipment Recommendations   None recommended by OT      Precautions/Restrictions   Precautions Precautions: Fall     Mobility Bed Mobility Overal bed mobility: Modified Independent             General bed mobility comments: increased time and effort to perform task     Transfers Overall transfer level: Needs assistance Equipment used: Rolling walker (2 wheels) Transfers: Sit to/from Stand Sit to Stand: Contact guard assist                  Balance Overall balance assessment: Needs assistance Sitting-balance support: No upper extremity supported Sitting balance-Leahy Scale: Normal     Standing balance support: Bilateral upper extremity supported, During functional activity, Reliant on assistive device for balance Standing balance-Leahy Scale: Fair                             ADL either performed or assessed with clinical judgement   ADL Overall ADL's : Needs assistance/impaired                                       General ADL Comments: CGA for balance with RW for functional transfers     Vision Patient Visual Report: No change from baseline              Pertinent Vitals/Pain Pain Assessment Pain Assessment: Faces Faces Pain Scale: Hurts even more Pain Location: B LEs and chronic back Pain Descriptors / Indicators: Discomfort, Aching Pain Intervention(s): Limited activity within patient's tolerance, Repositioned     Extremity/Trunk Assessment Upper Extremity Assessment Upper Extremity Assessment: Overall WFL for tasks assessed   Lower Extremity Assessment Lower Extremity Assessment: Overall WFL for tasks assessed       Communication Communication Communication: No apparent difficulties  Home Living Family/patient expects to be discharged to:: Other (Comment) (hotel) Living Arrangements: Alone Available Help at Discharge:  (sister lives nearby and can help as needed)   Home Access: Level entry           Bathroom Shower/Tub: Chief Strategy Officer: Standard     Home Equipment: Agricultural consultant (2 wheels);Cane - single point;Grab bars - tub/shower          Prior Functioning/Environment Prior Level of Function : Needs assist              Mobility Comments: Pt does not drive but is out of the home QD, uses SPC, reports he has had 100s of falls in the last 6 months ADLs Comments: Ind with self care. Sister helps get groceries.    OT Problem List: Decreased strength;Impaired balance (sitting and/or standing);Decreased range of motion;Decreased safety awareness;Decreased activity tolerance;Decreased knowledge of use of DME or AE   OT Treatment/Interventions: Self-care/ADL training;Therapeutic exercise;Patient/family education;Balance training;Energy conservation;Therapeutic activities      OT Goals(Current goals can be found in the care plan section)   Acute Rehab OT Goals Patient Stated Goal: to go home OT Goal Formulation: With patient Time For Goal Achievement: 03/15/24 Potential to Achieve Goals: Fair ADL Goals Pt Will Perform Grooming: with modified independence;standing Pt Will Perform Lower Body Dressing: with modified independence;sit to/from stand Pt Will Transfer to Toilet: with modified independence;ambulating Pt Will Perform Toileting - Clothing Manipulation and hygiene: with modified independence;sit to/from stand   OT Frequency:  Min 2X/week       AM-PAC OT 6 Clicks Daily Activity     Outcome Measure Help from another person eating meals?: None Help from another person taking care of personal grooming?: None Help from another person toileting, which includes using toliet, bedpan, or urinal?: A Little Help from another person bathing (including washing, rinsing, drying)?: A Little Help from another person to put on and taking off regular upper body clothing?: None Help from another person to put on and taking off regular lower body clothing?: A Little 6 Click Score: 21   End of Session Equipment Utilized During Treatment: Rolling walker (2 wheels) Nurse Communication: Mobility status  Activity Tolerance: Patient tolerated treatment well Patient left: with call bell/phone within reach;in  chair;with chair alarm set  OT Visit Diagnosis: Unsteadiness on feet (R26.81);Muscle weakness (generalized) (M62.81);History of falling (Z91.81)                Time: 8977-8953 OT Time Calculation (min): 24 min Charges:  OT General Charges $OT Visit: 1 Visit OT Evaluation $OT Eval Low Complexity: 1 Low OT Treatments $Therapeutic Activity: 8-22 mins  Izetta Claude, MS, OTR/L , CBIS ascom [phone]  03/01/24, 1:16 PM

## 2024-03-01 NOTE — Progress Notes (Signed)
 PROGRESS NOTE    [name]  FMW:[id number] DOB: January 29, 1966 DOA: 02/28/2024 PCP: Herlinda Burnard Garre, FNP    Assessment & Plan:   Principal Problem:   Rhabdomyolysis Active Problems:   Essential hypertension, benign   Type 2 diabetes mellitus with obesity (HCC)   Type 2 diabetes mellitus with complication (HCC)   Morbid obesity (HCC)   Anxiety and depression   History of CVA (cerebrovascular accident)   Hyperlipidemia   Morbid obesity with body mass index (BMI) of 40.0 to 44.9 in adult (HCC)   PAF (paroxysmal atrial fibrillation) (HCC)   GAD (generalized anxiety disorder)   OSA (obstructive sleep apnea)   AKI (acute kidney injury) (HCC)   Frequent falls  Assessment and Plan: Rhabdomyolysis: likely traumatic, secondary to frequent falls. CK is still elevated but trending down. Continue on IVFs  HLD: holding home dose of statin secondary to rhabdomyolysis   Frequent falls: etiology unclear. PT/OT recs HH but pt is currently living in a hotel    AKI: likely secondary to rhabdomyolysis. Cr is trending down again today.   Leukocytosis: resolved   Transaminitis: etiology unclear. Still elevated but trending down. Holding statin    OSA: CPAP qhs   PAF: continue on eliquis     Depression: severity unknown. Continue on home dose of nortriptyline     Morbid obesity: BMI 47.6. Complicates overall care & prognosis    DM2: well controlled, HbA1c 6.9. Continue on glargine, aspart, SSI w/ accuchecks    HTN: will likely restart home dose of lisinopril  tomorrow          DVT prophylaxis: eliquis  Code Status: full  Family Communication:  Disposition Plan: likely d/c back home   Level of care: Telemetry Medical Consultants:    Procedures:  Antimicrobials:     Subjective: Pt c/o pain   Objective: Vitals:   02/29/24 1507 02/29/24 1943 03/01/24 0433 03/01/24 0728  BP: 126/63 (!) 117/55 132/75 (!) 140/53  Pulse: (!) 101 98 94 97  Resp: 16 18 17 16   Temp:  98.2 F (36.8 C) 98.2 F (36.8 C) 98 F (36.7 C) 98.2 F (36.8 C)  TempSrc: Oral Oral Oral Oral  SpO2: 96% 95% 94% 96%  Weight:        Intake/Output Summary (Last 24 hours) at 03/01/2024 0954 Last data filed at 03/01/2024 0400 Gross per 24 hour  Intake 589.33 ml  Output --  Net 589.33 ml   Filed Weights   02/28/24 1454  Weight: (!) 159.2 kg    Examination:  General exam: Appears uncomfortable. Morbidly obese Respiratory system: decreased breath sounds b/l  Cardiovascular system: S1/S2+. No rubs or clicks  Gastrointestinal system: abd is soft, NT, obese & hypoactive bowel sounds  Central nervous system: alert & oriented. Moves all extremities  Psychiatry: Judgement and insight appears normal. Mood and affect appropriate     Data Reviewed: I have personally reviewed following labs and imaging studies  CBC: Recent Labs  Lab 02/28/24 1456 02/29/24 0432 03/01/24 0528  WBC 16.5* 11.5* 9.3  HGB 11.7* 11.8* 10.6*  HCT 36.9* 38.4* 32.9*  MCV 87.9 88.1 85.7  PLT 219 187 192   Basic Metabolic Panel: Recent Labs  Lab 02/28/24 1456 02/29/24 0432 03/01/24 0528  NA 139 139 143  K 5.0 4.4 3.9  CL 105 110 111  CO2 20* 20* 24  GLUCOSE 153* 155* 132*  BUN 64* 54* 29*  CREATININE 2.50* 1.67* 0.88  CALCIUM  8.3* 7.7* 8.6*   GFR: Estimated Creatinine Clearance: 146.1 mL/min (  by C-G formula based on SCr of 0.88 mg/dL). Liver Function Tests: Recent Labs  Lab 02/28/24 1456 03/01/24 0528  AST 142* 106*  ALT 60* 52*  ALKPHOS 86 70  BILITOT 1.1 0.7  PROT 6.7 6.0*  ALBUMIN  3.2* 2.8*   No results for input(s): LIPASE, AMYLASE in the last 168 hours. No results for input(s): AMMONIA in the last 168 hours. Coagulation Profile: No results for input(s): INR, PROTIME in the last 168 hours. Cardiac Enzymes: Recent Labs  Lab 02/28/24 1456 02/29/24 0432 03/01/24 0528  CKTOTAL 9,543* 8,722* 4,537*   BNP (last 3 results) No results for input(s): PROBNP in the  last 8760 hours. HbA1C: Recent Labs    02/29/24 0432  HGBA1C 6.9*   CBG: Recent Labs  Lab 02/29/24 0842 02/29/24 1140 02/29/24 1653 02/29/24 2103 03/01/24 0732  GLUCAP 144* 169* 149* 128* 174*   Lipid Profile: No results for input(s): CHOL, HDL, LDLCALC, TRIG, CHOLHDL, LDLDIRECT in the last 72 hours. Thyroid Function Tests: No results for input(s): TSH, T4TOTAL, FREET4, T3FREE, THYROIDAB in the last 72 hours. Anemia Panel: No results for input(s): VITAMINB12, FOLATE, FERRITIN, TIBC, IRON, RETICCTPCT in the last 72 hours. Sepsis Labs: No results for input(s): PROCALCITON, LATICACIDVEN in the last 168 hours.  No results found for this or any previous visit (from the past 240 hours).       Radiology Studies: DG Chest 2 View Result Date: 02/28/2024 CLINICAL DATA:  Weakness and shortness of breath.  Fall yesterday. EXAM: CHEST - 2 VIEW COMPARISON:  05/30/2017. FINDINGS: Patient is rotated. Trachea is midline. Heart size is accentuated by technique and low lung volumes. No airspace consolidation or pleural fluid. IMPRESSION: Low lung volumes.  No acute findings. Electronically Signed   By: Newell Eke M.D.   On: 02/28/2024 16:40   CT Cervical Spine Wo Contrast Result Date: 02/28/2024 CLINICAL DATA:  fall EXAM: CT CERVICAL SPINE WITHOUT CONTRAST TECHNIQUE: Multidetector CT imaging of the cervical spine was performed without intravenous contrast. Multiplanar CT image reconstructions were also generated. RADIATION DOSE REDUCTION: This exam was performed according to the departmental dose-optimization program which includes automated exposure control, adjustment of the mA and/or kV according to patient size and/or use of iterative reconstruction technique. COMPARISON:  None Available. FINDINGS: Alignment: Normal. Skull base and vertebrae: No acute fracture. No primary bone lesion or focal pathologic process. Soft tissues and spinal canal: No  prevertebral fluid or swelling. No visible canal hematoma. Disc levels: There is mild disc space narrowing at C5-6 and C6-7. There is mild bilateral facet arthrosis at C2-3 and mild to moderate right-sided facet arthrosis at C3-4 and C4-5. Upper chest: The visualized lung apices are clear. Other: None. IMPRESSION: 1. Multilevel degenerative disc disease and facet arthropathy. No evidence of acute traumatic injury. Electronically Signed   By: Evalene Coho M.D.   On: 02/28/2024 16:00   CT Head Wo Contrast Result Date: 02/28/2024 CLINICAL DATA:  fall EXAM: CT HEAD WITHOUT CONTRAST TECHNIQUE: Contiguous axial images were obtained from the base of the skull through the vertex without intravenous contrast. RADIATION DOSE REDUCTION: This exam was performed according to the departmental dose-optimization program which includes automated exposure control, adjustment of the mA and/or kV according to patient size and/or use of iterative reconstruction technique. COMPARISON:  CT the head dated October 30, 2023. FINDINGS: Brain: Wedge-shaped encephalomalacia changes within the left cerebellar hemisphere, as before. Age-related cerebral volume loss. No evidence of hemorrhage, mass or acute cortical infarct. Vascular: Mild calcific atheromatous disease within  the carotid siphons. Skull: Intact and unremarkable. Sinuses/Orbits: Clear paranasal sinuses. Status post bilateral lens replacement. Other: None. IMPRESSION: 1. 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 No evidence of acute intracranial injury. Electronically Signed   By: Evalene Coho M.D.   On: 02/28/2024 15:58        Scheduled Meds:  apixaban   5 mg Oral BID   aspirin  EC  81 mg Oral Daily   famotidine   20 mg Oral Daily   HYDROcodone -acetaminophen   1 tablet Oral QID   insulin  aspart  0-5 Units Subcutaneous QHS   insulin  aspart  5 Units Subcutaneous TID WC   insulin  glargine-yfgn  15 Units Subcutaneous QHS   lacosamide    150 mg Oral BID   methocarbamol   1,000 mg Oral TID   nortriptyline   75 mg Oral QHS   pregabalin   100 mg Oral TID   Continuous Infusions:  sodium chloride  100 mL/hr at 02/29/24 1811     LOS: 2 days      Anthony CHRISTELLA Pouch, MD [facility] Pager [phone]  If 7PM-7AM, please contact night-coverage [email or web address] 03/01/2024, 9:54 AM

## 2024-03-01 NOTE — TOC Progression Note (Signed)
 Transition of Care [facility]) - Progression Note    Patient Details  Name: [name] MRN: [id number] Date of Birth: 12/04/1965  [facility]) CM/SW Contact  Elouise LULLA Capri, RN Phone Number: 03/01/2024, 5:40 PM  Clinical Narrative:      Per therapy recommendation for bariatric rolling walker and 3 in 1 bedside commode. CM secure message to [place], [employer] for bedside delivery.        Social Drivers of Health (SDOH) Interventions SDOH Screenings   Food Insecurity: No Food Insecurity (02/29/2024)  Recent Concern: Food Insecurity - Food Insecurity Present (01/14/2024)   Received from [facility]  Housing: Low Risk  (02/29/2024)  Recent Concern: Housing - High Risk (01/14/2024)   Received from [facility]  Transportation Needs: No Transportation Needs (02/29/2024)  Recent Concern: Transportation Needs - Unmet Transportation Needs (01/14/2024)   Received from [facility]  Utilities: Not At Risk (02/29/2024)  Recent Concern: Utilities - At Risk (01/14/2024)   Received from [facility]  Depression (PHQ2-9): High Risk (01/04/2024)  Financial Resource Strain: High Risk (01/14/2024)   Received from [facility]  Physical Activity: Unknown (01/14/2024)   Received from [facility]  Social Connections: Somewhat Isolated (01/14/2024)   Received from [facility]  Stress: Stress Concern Present (01/14/2024)   Received from [facility]  Tobacco Use: Medium Risk (02/28/2024)    Readmission Risk Interventions     No data to display

## 2024-03-01 NOTE — Plan of Care (Signed)

## 2024-03-02 DIAGNOSIS — T796XXA Traumatic ischemia of muscle, initial encounter: Secondary | ICD-10-CM | POA: Diagnosis not present

## 2024-03-02 LAB — COMPREHENSIVE METABOLIC PANEL WITH GFR
ALT: 47 U/L — ABNORMAL HIGH (ref 0–44)
AST: 72 U/L — ABNORMAL HIGH (ref 15–41)
Albumin: 2.8 g/dL — ABNORMAL LOW (ref 3.5–5.0)
Alkaline Phosphatase: 71 U/L (ref 38–126)
Anion gap: 8 (ref 5–15)
BUN: 15 mg/dL (ref 6–20)
CO2: 23 mmol/L (ref 22–32)
Calcium: 8.8 mg/dL — ABNORMAL LOW (ref 8.9–10.3)
Chloride: 110 mmol/L (ref 98–111)
Creatinine, Ser: 0.74 mg/dL (ref 0.61–1.24)
GFR, Estimated: >60 mL/min (ref >60)
Glucose, Bld: 107 mg/dL — ABNORMAL HIGH (ref 70–99)
Potassium: 4 mmol/L (ref 3.5–5.1)
Sodium: 141 mmol/L (ref 135–145)
Total Bilirubin: 0.7 mg/dL (ref 0.0–1.2)
Total Protein: 5.8 g/dL — ABNORMAL LOW (ref 6.5–8.1)

## 2024-03-02 LAB — CBC
HCT: 33.9 % — ABNORMAL LOW (ref 39.0–52.0)
Hemoglobin: 10.6 g/dL — ABNORMAL LOW (ref 13.0–17.0)
MCH: 27.2 pg (ref 26.0–34.0)
MCHC: 31.3 g/dL (ref 30.0–36.0)
MCV: 86.9 fL (ref 80.0–100.0)
Platelets: 182 K/uL (ref 150–400)
RBC: 3.9 MIL/uL — ABNORMAL LOW (ref 4.22–5.81)
RDW: 15 % (ref 11.5–15.5)
WBC: 7 K/uL (ref 4.0–10.5)
nRBC: 0 % (ref 0.0–0.2)

## 2024-03-02 LAB — CK: Total CK: 1520 U/L — ABNORMAL HIGH (ref 49–397)

## 2024-03-02 LAB — GLUCOSE, CAPILLARY
Glucose-Capillary: 105 mg/dL — ABNORMAL HIGH (ref 70–99)
Glucose-Capillary: 114 mg/dL — ABNORMAL HIGH (ref 70–99)

## 2024-03-02 MED ORDER — OXYCODONE HCL 10 MG PO TABS: 10.0000 mg | ORAL_TABLET | Freq: Four times a day (QID) | ORAL | 0 refills | Status: AC | PRN

## 2024-03-02 NOTE — Progress Notes (Signed)
 Reviewed discharge instructions with patient. Patient acknowledged understanding. Patient discharged with personal belongings. Patient wheeled out by staff. Patient transported home via family vehicle. No distress noted in patient.

## 2024-03-02 NOTE — TOC CM/SW Note (Signed)
..  Transition of Care [facility]) - Inpatient Brief Assessment   Patient Details  Name: [name] MRN: [id number] Date of Birth: 1965-12-16  Transition of Care [facility]) CM/SW Contact:    Edsel DELENA [name], LCSW Phone Number: 03/02/2024, 12:44 PM   Clinical Narrative:  SW was asked about pt DME equipment in reference to delivery.  SW contact [employer] and was told that pt DME will be delivered to pts room today by 4pm.  SW notified staff  Transition of Care Asessment:

## 2024-03-02 NOTE — Discharge Summary (Signed)
 Physician Discharge Summary  [name] FMW:[id number] DOB: 03/21/1966 DOA: 02/28/2024  PCP: Herlinda Burnard Garre, FNP  Admit date: 02/28/2024 Discharge date: 03/02/2024  Admitted From: home  Disposition:  home   Recommendations for Outpatient Follow-up:  Follow up with PCP in 1-2 weeks Will need to get BMP to check Cr/GFR & CK level   Home Health:  Equipment/Devices: 3N1, walker  Discharge Condition: stable  CODE STATUS:full  Diet recommendation: Carb Modified   Brief/Interim Summary: HPI was taken from Dr. Sherre: [name] is a 58 year old male with history of morbid obesity, hypertension, hyperlipidemia, insulin -dependent diabetes mellitus, atrial fibrillation on Eliquis , who presents emergency department for chief concerns of a fall.   Vitals in the ED showed T of 98.1, rr 19, hr 99, BP 135/78, SpO2 of 86% on room air.   Serum sodium is 139, potassium 5.0, chloride 105, bicarb 20, BUN of 64, serum creatinine of 2.50, EGFR of 29, nonfasting blood glucose 153, WBC 16.5, hemoglobin 11.7, platelets of 219.   AST 142.  ALT 60.  CK was elevated 9543.  HS troponin is 77.   ED treatment: Oxycodone  5 mg p.o. one-time dose, LR 1 L bolus, followed by additional LR 1 L bolus ------------------------------------ At bedside, patient able to tell me his name, age, location, current calendar year.   He reports that last night he wanted to get on his bed and when he was climbing up to his bed, both his legs felt so weak that he just could not get up and he just slid to the floor.  He stayed there all night.  And when when he woke up he felt worsening weakness prompting EMS to be called.   He denies other trauma to his person.  He denies chest pain, shortness of breath, dysuria, hematuria, diarrhea, blood in his stool, swelling of his lower extremities.  Discharge Diagnoses:  Principal Problem:   Rhabdomyolysis Active Problems:   Essential hypertension, benign   Type 2 diabetes  mellitus with obesity (HCC)   Type 2 diabetes mellitus with complication (HCC)   Morbid obesity (HCC)   Anxiety and depression   History of CVA (cerebrovascular accident)   Hyperlipidemia   Morbid obesity with body mass index (BMI) of 40.0 to 44.9 in adult (HCC)   PAF (paroxysmal atrial fibrillation) (HCC)   GAD (generalized anxiety disorder)   OSA (obstructive sleep apnea)   AKI (acute kidney injury) (HCC)   Frequent falls  Rhabdomyolysis: likely traumatic, secondary to frequent falls. CK is still elevated but trending down. Continue on IVFs  HLD: holding home dose of statin secondary to rhabdomyolysis   Frequent falls: etiology unclear. PT/OT recs HH but pt is currently living in a hotel    AKI: likely secondary to rhabdomyolysis. Resolved  Leukocytosis: resolved   Transaminitis: etiology unclear. Still elevated but trending down. Holding statin    OSA: CPAP qhs   PAF: continue on eliquis     Depression: severity unknown. Continue on home dose of nortriptyline     Morbid obesity: BMI 47.6. Complicates overall care & prognosis    DM2: well controlled, HbA1c 6.9. Continue on glargine, aspart, SSI w/ accuchecks    HTN: restart home dose of lisinopril     Discharge Instructions  Discharge Instructions     Diet Carb Modified   Complete by: As directed    Discharge instructions   Complete by: As directed    F/u w/ PCP in 1-2 weeks. Will need to get BMP to check Cr/GFR/kidney function &  CK to make sure level is trending down still.   Increase activity slowly   Complete by: As directed       Allergies as of 03/02/2024       Reactions   No Known Allergies         Medication List     PAUSE taking these medications    atorvastatin  20 MG tablet Wait to take this until: April 02, 2024 Commonly known as: LIPITOR Take 1 tablet (20 mg total) by mouth daily.       STOP taking these medications    HYDROcodone -acetaminophen  5-325 MG tablet Commonly known as:  NORCO/VICODIN   mupirocin  ointment 2 % Commonly known as: BACTROBAN    oxyCODONE -acetaminophen  5-325 MG tablet Commonly known as: PERCOCET/ROXICET       TAKE these medications    apixaban  5 MG Tabs tablet Commonly known as: ELIQUIS  Take 5 mg by mouth 2 (two) times daily.   aspirin  EC 81 MG tablet Take 81 mg by mouth daily. Swallow whole.   Contour Next EZ w/Device Kit 1 each by Does not apply route 3 (three) times daily.   dicyclomine 10 MG capsule Commonly known as: BENTYL Take 10 mg by mouth 3 (three) times daily before meals.   diphenoxylate-atropine 2.5-0.025 MG tablet Commonly known as: LOMOTIL Take 1 tablet by mouth 4 (four) times daily as needed for diarrhea or loose stools.   escitalopram  10 MG tablet Commonly known as: LEXAPRO  Take 0.5 tablets (5 mg total) by mouth daily for 7 days.   glucose blood test strip Commonly known as: Contour Next Test Use as instructed   insulin  glargine 100 UNIT/ML Solostar Pen Commonly known as: Lantus  SoloStar Inject 38 Units into the skin daily at 10 pm.   insulin  lispro 100 UNIT/ML KwikPen Commonly known as: HUMALOG  Inject 10 Units into the skin 3 (three) times daily.   Lacosamide  150 MG Tabs Take 150 mg by mouth 2 (two) times daily. What changed: Another medication with the same name was removed. Continue taking this medication, and follow the directions you see here.   lisinopril  10 MG tablet Commonly known as: ZESTRIL  Take 1 tablet (10 mg total) by mouth daily.   methocarbamol  500 MG tablet Commonly known as: ROBAXIN  Take 1,000 mg by mouth 3 (three) times daily.   Microlet Lancets Misc 1 each by Does not apply route 3 (three) times daily.   nitroGLYCERIN  0.2 mg/hr patch Commonly known as: NITRODUR - Dosed in mg/24 hr Place 1 patch (0.2 mg total) onto the skin daily.   nortriptyline  75 MG capsule Commonly known as: PAMELOR  TAKE 1 CAPSULE BY MOUTH AT BEDTIME.   Oxycodone  HCl 10 MG Tabs Take 1 tablet (10  mg total) by mouth every 6 (six) hours as needed for up to 5 days for moderate pain (pain score 4-6) or severe pain (pain score 7-10).   pregabalin  200 MG capsule Commonly known as: LYRICA  Take 200 mg by mouth 3 (three) times daily.   Semaglutide(0.25 or 0.5MG /DOS) 2 MG/1.5ML Sopn Inject 0.25 mg into the skin once a week.   traMADol  50 MG tablet Commonly known as: ULTRAM  Take 50 mg by mouth daily.   Vitamin D  (Ergocalciferol ) 1.25 MG (50000 UNIT) Caps capsule Commonly known as: DRISDOL Take 50,000 Units by mouth once a week.               Durable Medical Equipment  (From admission, onward)           Start  Ordered   03/01/24 1739  For home use only DME Walker rolling  Once       Comments: BARIATRIC SIZE  Question Answer Comment  Walker: With 5 Inch Wheels   Patient needs a walker to treat with the following condition Generalized weakness      03/01/24 1739   03/01/24 1739  For home use only DME 3 n 1  Once       Comments: BARIATRIC SIZE   03/01/24 1739            Allergies  Allergen Reactions   No Known Allergies     Consultations:    Procedures/Studies: DG Chest 2 View Result Date: 02/28/2024 CLINICAL DATA:  Weakness and shortness of breath.  Fall yesterday. EXAM: CHEST - 2 VIEW COMPARISON:  05/30/2017. FINDINGS: Patient is rotated. Trachea is midline. Heart size is accentuated by technique and low lung volumes. No airspace consolidation or pleural fluid. IMPRESSION: Low lung volumes.  No acute findings. Electronically Signed   By: Newell Eke M.D.   On: 02/28/2024 16:40   CT Cervical Spine Wo Contrast Result Date: 02/28/2024 CLINICAL DATA:  fall EXAM: CT CERVICAL SPINE WITHOUT CONTRAST TECHNIQUE: Multidetector CT imaging of the cervical spine was performed without intravenous contrast. Multiplanar CT image reconstructions were also generated. RADIATION DOSE REDUCTION: This exam was performed according to the departmental dose-optimization  program which includes automated exposure control, adjustment of the mA and/or kV according to patient size and/or use of iterative reconstruction technique. COMPARISON:  None Available. FINDINGS: Alignment: Normal. Skull base and vertebrae: No acute fracture. No primary bone lesion or focal pathologic process. Soft tissues and spinal canal: No prevertebral fluid or swelling. No visible canal hematoma. Disc levels: There is mild disc space narrowing at C5-6 and C6-7. There is mild bilateral facet arthrosis at C2-3 and mild to moderate right-sided facet arthrosis at C3-4 and C4-5. Upper chest: The visualized lung apices are clear. Other: None. IMPRESSION: 1. Multilevel degenerative disc disease and facet arthropathy. No evidence of acute traumatic injury. Electronically Signed   By: Evalene Coho M.D.   On: 02/28/2024 16:00   CT Head Wo Contrast Result Date: 02/28/2024 CLINICAL DATA:  fall EXAM: CT HEAD WITHOUT CONTRAST TECHNIQUE: Contiguous axial images were obtained from the base of the skull through the vertex without intravenous contrast. RADIATION DOSE REDUCTION: This exam was performed according to the departmental dose-optimization program which includes automated exposure control, adjustment of the mA and/or kV according to patient size and/or use of iterative reconstruction technique. COMPARISON:  CT the head dated October 30, 2023. FINDINGS: Brain: Wedge-shaped encephalomalacia changes within the left cerebellar hemisphere, as before. Age-related cerebral volume loss. No evidence of hemorrhage, mass or acute cortical infarct. Vascular: Mild calcific atheromatous disease within the carotid siphons. Skull: Intact and unremarkable. Sinuses/Orbits: Clear paranasal sinuses. Status post bilateral lens replacement. Other: None. IMPRESSION: 1. 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 No evidence of acute intracranial injury. Electronically Signed   By: Evalene Coho M.D.    On: 02/28/2024 15:58   (Echo, Carotid, EGD, Colonoscopy, ERCP)    Subjective: Pt c/o pain    Discharge Exam: Vitals:   03/02/24 0450 03/02/24 0749  BP: (!) 149/69 135/77  Pulse: 94 89  Resp: 18 17  Temp: 98 F (36.7 C) 98 F (36.7 C)  SpO2: 95% 96%   Vitals:   03/01/24 1456 03/01/24 1941 03/02/24 0450 03/02/24 0749  BP: 135/65 (!) 124/50 (!) 149/69 135/77  Pulse: 93  93 94 89  Resp: 16 18 18 17   Temp: 98.2 F (36.8 C) 97.9 F (36.6 C) 98 F (36.7 C) 98 F (36.7 C)  TempSrc: Oral   Oral  SpO2: 94% 94% 95% 96%  Weight:        General: Pt is alert, awake, not in acute distress. Morbidly obese Cardiovascular: S1/S2 +, no rubs, no gallops Respiratory: decreased breath sounds b/l Abdominal: Soft, NT, obese, bowel sounds + Extremities: no edema, no cyanosis    The results of significant diagnostics from this hospitalization (including imaging, microbiology, ancillary and laboratory) are listed below for reference.     Microbiology: No results found for this or any previous visit (from the past 240 hours).   Labs: BNP (last 3 results) No results for input(s): BNP in the last 8760 hours. Basic Metabolic Panel: Recent Labs  Lab 02/28/24 1456 02/29/24 0432 03/01/24 0528 03/02/24 0552  NA 139 139 143 141  K 5.0 4.4 3.9 4.0  CL 105 110 111 110  CO2 20* 20* 24 23  GLUCOSE 153* 155* 132* 107*  BUN 64* 54* 29* 15  CREATININE 2.50* 1.67* 0.88 0.74  CALCIUM  8.3* 7.7* 8.6* 8.8*   Liver Function Tests: Recent Labs  Lab 02/28/24 1456 03/01/24 0528 03/02/24 0552  AST 142* 106* 72*  ALT 60* 52* 47*  ALKPHOS 86 70 71  BILITOT 1.1 0.7 0.7  PROT 6.7 6.0* 5.8*  ALBUMIN  3.2* 2.8* 2.8*   No results for input(s): LIPASE, AMYLASE in the last 168 hours. No results for input(s): AMMONIA in the last 168 hours. CBC: Recent Labs  Lab 02/28/24 1456 02/29/24 0432 03/01/24 0528 03/02/24 0552  WBC 16.5* 11.5* 9.3 7.0  HGB 11.7* 11.8* 10.6* 10.6*  HCT 36.9*  38.4* 32.9* 33.9*  MCV 87.9 88.1 85.7 86.9  PLT 219 187 192 182   Cardiac Enzymes: Recent Labs  Lab 02/28/24 1456 02/29/24 0432 03/01/24 0528 03/02/24 0552  CKTOTAL 9,543* 8,722* 4,537* 1,520*   BNP: Invalid input(s): POCBNP CBG: Recent Labs  Lab 03/01/24 1149 03/01/24 1622 03/01/24 2050 03/02/24 0735 03/02/24 1143  GLUCAP 149* 141* 148* 105* 114*   D-Dimer No results for input(s): DDIMER in the last 72 hours. Hgb A1c Recent Labs    02/29/24 0432  HGBA1C 6.9*   Lipid Profile No results for input(s): CHOL, HDL, LDLCALC, TRIG, CHOLHDL, LDLDIRECT in the last 72 hours. Thyroid function studies No results for input(s): TSH, T4TOTAL, T3FREE, THYROIDAB in the last 72 hours.  Invalid input(s): FREET3 Anemia work up No results for input(s): VITAMINB12, FOLATE, FERRITIN, TIBC, IRON, RETICCTPCT in the last 72 hours. Urinalysis    Component Value Date/Time   COLORURINE YELLOW (A) 02/28/2024 2018   APPEARANCEUR CLOUDY (A) 02/28/2024 2018   LABSPEC 1.023 02/28/2024 2018   PHURINE 5.0 02/28/2024 2018   GLUCOSEU NEGATIVE 02/28/2024 2018   HGBUR LARGE (A) 02/28/2024 2018   BILIRUBINUR NEGATIVE 02/28/2024 2018   KETONESUR NEGATIVE 02/28/2024 2018   PROTEINUR 30 (A) 02/28/2024 2018   NITRITE NEGATIVE 02/28/2024 2018   LEUKOCYTESUR NEGATIVE 02/28/2024 2018   Sepsis Labs Recent Labs  Lab 02/28/24 1456 02/29/24 0432 03/01/24 0528 03/02/24 0552  WBC 16.5* 11.5* 9.3 7.0   Microbiology No results found for this or any previous visit (from the past 240 hours).   Time coordinating discharge: Over 30 minutes  SIGNED:   Anthony CHRISTELLA Pouch, MD  [facility] 03/02/2024, 12:04 PM Pager   If 7PM-7AM, please contact night-coverage [email or web address]

## 2024-03-02 NOTE — Plan of Care (Signed)
  Problem: Education: Goal: Knowledge of General Education information will improve Description: Including pain rating scale, medication(s)/side effects and non-pharmacologic comfort measures Outcome: Progressing   Problem: Health Behavior/Discharge Planning: Goal: Ability to manage health-related needs will improve Outcome: Progressing   Problem: Activity: Goal: Risk for activity intolerance will decrease Outcome: Progressing   Problem: Nutrition: Goal: Adequate nutrition will be maintained Outcome: Progressing   Problem: Elimination: Goal: Will not experience complications related to bowel motility Outcome: Progressing Goal: Will not experience complications related to urinary retention Outcome: Progressing   Problem: Safety: Goal: Ability to remain free from injury will improve Outcome: Progressing

## 2024-03-02 NOTE — Progress Notes (Signed)
 Patient is not able to walk the distance required to go the bathroom, or he/she is unable to safely negotiate stairs required to access the bathroom.  A 3in1 BSC will alleviate this problem

## 2024-03-14 DIAGNOSIS — G5601 Carpal tunnel syndrome, right upper limb: Secondary | ICD-10-CM | POA: Insufficient documentation

## 2024-03-21 ENCOUNTER — Ambulatory Visit: Admitting: Internal Medicine

## 2024-03-21 DIAGNOSIS — R42 Dizziness and giddiness: Secondary | ICD-10-CM | POA: Insufficient documentation

## 2024-03-28 ENCOUNTER — Encounter: Payer: Self-pay | Admitting: Psychiatry

## 2024-03-28 ENCOUNTER — Ambulatory Visit (INDEPENDENT_AMBULATORY_CARE_PROVIDER_SITE_OTHER): Admitting: Psychiatry

## 2024-03-28 VITALS — BP 124/78 | HR 104 | Temp 98.6°F | Ht 72.0 in | Wt 329.8 lb

## 2024-03-28 DIAGNOSIS — G4701 Insomnia due to medical condition: Secondary | ICD-10-CM

## 2024-03-28 DIAGNOSIS — F411 Generalized anxiety disorder: Secondary | ICD-10-CM | POA: Diagnosis not present

## 2024-03-28 MED ORDER — BUSPIRONE HCL 10 MG PO TABS: 10.0000 mg | ORAL_TABLET | Freq: Two times a day (BID) | ORAL | 1 refills | Status: DC

## 2024-03-28 NOTE — Patient Instructions (Signed)
 Buspirone  Tablets What is this medication? BUSPIRONE  (byoo SPYE rone) treats anxiety. It works by balancing the levels of dopamine and serotonin in your brain, substances that help regulate mood. This medicine may be used for other purposes; ask your health care provider or pharmacist if you have questions. COMMON BRAND NAME(S): BuSpar , Buspar  Dividose What should I tell my care team before I take this medication? They need to know if you have any of these conditions: Kidney disease Liver disease An unusual or allergic reaction to buspirone , other medications, foods, dyes, or preservatives Pregnant or trying to get pregnant Breastfeeding How should I use this medication? Take this medication by mouth with water. Take it as directed on the prescription label. You can take it with or without food. You should always take it the same way. Keep taking it unless your care team tells you to stop. Do not take this medication with foods or drinks that contain grapefruit. Talk to your care team about the use of this medication in children. Special care may be needed. Overdosage: If you think you have taken too much of this medicine contact a poison control center or emergency room at once. NOTE: This medicine is only for you. Do not share this medicine with others. What if I miss a dose? If you miss a dose, take it as soon as you can. If it is almost time for your next dose, take only that dose. Do not take double or extra doses. What may interact with this medication? Do not take this medication with any of the following: Linezolid MAOIs like Carbex, Eldepryl, Marplan, Nardil, and Parnate Methylene blue Procarbazine This medication may also interact with the following: Alcohol Diazepam Digoxin Droperidol Grapefruit juice Haloperidol Metoclopramide  Opioids Phenothiazines, such as chlorpromazine, prochlorperazine, thioridazine Some medications for depression, anxiety, or other mental health  conditions Some medication for migraines, such as sumatriptan Stimulant medications for ADHD, weight loss, or staying awake Supplements, such as St. John's wort or tryptophan Tetrabenazine Other medications may affect the way this medication works. Talk with your care team about all the medications you take. They may suggest changes to your treatment plan to lower the risk of side effects and to make sure your medications work as intended. This list may not describe all possible interactions. Give your health care provider a list of all the medicines, herbs, non-prescription drugs, or dietary supplements you use. Also tell them if you smoke, drink alcohol, or use illegal drugs. Some items may interact with your medicine. What should I watch for while using this medication? Visit your care team for regular checks on your progress. It may take 1 to 2 weeks before your anxiety gets better. This medication may affect your coordination, reaction time, or judgment. Do not drive or operate machinery until you know how this medication affects you. Sit up or stand slowly to reduce the risk of dizzy or fainting spells. Drinking alcohol with this medication can increase the risk of these side effects. Serotonin syndrome is when your body has too much serotonin in it. This happens when this medication is used with other ones that increase serotonin levels. Common medications that increase serotonin levels are antidepressants, some medications for migraines, and some antibiotics. The symptoms of serotonin syndrome include irritability, confusion, fast or irregular heartbeat, muscle stiffness, twitching muscles, sweating, high fever, seizure, chills, vomiting and diarrhea. Contact your care team right away if you think you have serotonin syndrome. Talk to your care team about this medication if  you are breastfeeding. There are benefits and risks to taking medications while breastfeeding. Your care team can help you  find the option that works for you. What side effects may I notice from receiving this medication? Side effects that you should report to your care team as soon as possible: Allergic reactions--skin rash, itching, hives, swelling of the face, lips, tongue, or throat Irritability, confusion, fast or irregular heartbeat, muscle stiffness, twitching muscles, sweating, high fever, seizure, chills, vomiting, diarrhea, which may be signs of serotonin syndrome Side effects that usually do not require medical attention (report to your care team if they continue or are bothersome): Anxiety, nervousness Dizziness Drowsiness Headache Nausea Trouble sleeping This list may not describe all possible side effects. Call your doctor for medical advice about side effects. You may report side effects to FDA at 1-800-FDA-1088. Where should I keep my medication? Keep out of reach of children and pets. Store at room temperature between 20 and 25 degrees C (68 and 77 degrees F). Get rid of any unused medication after the expiration date. To get rid of medications that are no longer needed or expired: Take the medication to a take-back program. Check with your pharmacy or law enforcement to find a location. If you cannot return the medication, check the label or package insert to see if the medication should be thrown out in the garbage or flushed down the toilet. If you are not sure, ask your care team. If it is safe to put it in the trash, empty the medication out of the container. Mix it with cat litter, dirt, coffee grounds, or another unwanted substance. Seal the mixture in a bag or container. Put it in the trash. NOTE: This sheet is a summary. It may not cover all possible information. If you have questions about this medicine, talk to your doctor, pharmacist, or health care provider.  2025 [employer]vier/Gold Standard (2024-01-02 00:00:00)

## 2024-03-28 NOTE — Progress Notes (Signed)
 [facility] MD OP Progress Note  03/28/2024 12:46 PM [name]  MRN:  [id number]  Chief Complaint:  Chief Complaint  Patient presents with   Follow-up   Medication Refill   Anxiety   Insomnia   Discussed the use of AI scribe software for clinical note transcription with the patient, who gave verbal consent to proceed.  History of Present Illness [name] is a 58 year old Caucasian male, single, disability, lives in [place], has a history of generalized anxiety disorder, insomnia, hypertension, history of cerebellar stroke, left-sided numbness and weakness, history of syncope/recent break, atrial fibrillation, chronic pain, diabetes mellitus type 2, anxiety was evaluated in office today for a follow-up appointment.  He describes his anxiety as currently severe, using the phrase through the roof, and notes constant physical symptoms such as butterflies in his stomach. Significant psychosocial stressors, including financial insecurity, inability to work due to health issues, ongoing recertification for disability benefits, loss of food and nutrition assistance, and living in a hotel following eviction from his previous residence, contribute to his distress. He feels overwhelmed by these circumstances and expresses concern about his living situation and lack of stability.  Denies any suicidality, homicidality or perceptual disturbances.  He continues to experience sleep disturbance, with frequent awakenings every 1.5 to 2.5 hours during the night. He often wakes before sunrise and does not feel the desire to return to sleep, but later in the morning experiences fatigue and a wish to rest. He reports that untreated obstructive sleep apnea, for which he cannot tolerate CPAP due to chronic nasal congestion, contributes to his poor sleep. He recognizes the importance of sleep and is pursuing further evaluation for alternative treatments.  He currently takes nortriptyline  75 mg, which he  increased since his last visit. He does not perceive significant benefit for his anxiety from nortriptyline  but notes mild improvement in neuropathy symptoms, particularly in his fingers. Missing several days of nortriptyline  led to worsening neuropathy symptoms. He also takes lorazepam  (Ativan ) approximately once per week for anxiety. Lyrica  (pregabalin ) provides some relief for neuropathy and possibly for mood and anxiety. He expresses discomfort with the number of medications prescribed and a desire to reduce them if possible.  He continues to make ongoing efforts to access therapy, with difficulty finding a therapist who accepts his insurance or is accepting new patients. He recently found a therapist at [facility] and has an upcoming telehealth appointment.    Visit Diagnosis:    ICD-10-CM   1. GAD (generalized anxiety disorder)  F41.1 busPIRone  (BUSPAR ) 10 MG tablet    2. Insomnia due to medical condition  G47.01    anxiety, osa not on cpap      Past Psychiatric History: I have reviewed past psychiatric history from progress note on 01/04/2024.  Past trials of medications like Lexapro , Wellbutrin.  Past Medical History:  Past Medical History:  Diagnosis Date   Anxiety    Carotid stenosis, right    Diabetes mellitus without complication (HCC)    Fracture closed, humerus    right   Fracture of humeral shaft, right, closed 09/02/2016   Fracture of humerus, proximal, right, closed 09/03/2016   Hyperlipidemia    Hypertension    Neuropathy    Paroxysmal atrial fibrillation (HCC)    Pre-syncope    Stroke [facility])    Vasomotor rhinitis     Past Surgical History:  Procedure Laterality Date   AMPUTATION TOE Right 06/16/2017   Procedure: AMPUTATION TOE right foot 2nd toe;  Surgeon: Harden Lame  V, MD;  Location: [facility] OR;  Service: Orthopedics;  Laterality: Right;   CATARACT EXTRACTION Left    CHOLECYSTECTOMY     cyst removal knee     CYST REMOVAL NECK     Lumbar radiculopathy     Lumbar  stenosis     ORIF HUMERUS FRACTURE Right 09/02/2016   Procedure: OPEN REDUCTION INTERNAL FIXATION (ORIF) RIGHT  HUMERAL SHAFT FRACTURE;  Surgeon: Ozell Bruch, MD;  Location: [facility] OR;  Service: Orthopedics;  Laterality: Right;   WISDOM TOOTH EXTRACTION      Family Psychiatric History: I have reviewed family psychiatric history from progress note on 01/04/2024.  Family History:  Family History  Problem Relation Age of Onset   Sjogren's syndrome Mother    CAD Father    Diabetes Father    Heart disease Father    Mental illness Neg Hx     Social History: I have reviewed social history from progress note on 01/04/2024. Social History   Socioeconomic History   Marital status: Single    Spouse name: Not on file   Number of children: 0   Years of education: Not on file   Highest education level: Associate degree: occupational, Scientist, product/process development, or vocational program  Occupational History   Not on file  Tobacco Use   Smoking status: Former    Current packs/day: 0.00    Types: Cigarettes    Quit date: 08/09/2011    Years since quitting: 12.6   Smokeless tobacco: Never  Vaping Use   Vaping status: Never Used  Substance and Sexual Activity   Alcohol use: Not Currently    Comment: socially    Drug use: Not Currently    Types: Marijuana    Comment: In High school   Sexual activity: Yes  Other Topics Concern   Not on file  Social History Narrative   Not on file   Social Drivers of Health   Financial Resource Strain: High Risk (01/14/2024)   Received from [facility]   Overall Financial Resource Strain (CARDIA)    Difficulty of Paying Living Expenses: Hard  Food Insecurity: High Risk (03/08/2024)   Received from [facility]   Hunger Vital Sign    Within the past 12 months, you worried that your food would run out before you got money to buy more: Often true    Within the past 12 months, the food you bought just didn't last and you didn't have money to get more. : Often true   Transportation Needs: Unmet Transportation Needs (03/08/2024)   Received from [facility]    In the past 12 months, has lack of reliable transportation kept you from medical appointments, meetings, work or from getting things needed for daily living? : Yes  Physical Activity: Unknown (01/14/2024)   Received from [facility]   Exercise Vital Sign    On average, how many days per week do you engage in moderate to strenuous exercise (like a brisk walk)?: Patient declined    Minutes of Exercise per Session: Not on file  Stress: Stress Concern Present (01/14/2024)   Received from [facility] - Occupational Stress Questionnaire    Feeling of Stress : Very much  Social Connections: Somewhat Isolated (01/14/2024)   Received from [facility]   Social Network    How would you rate your social network (family, work, friends)?: Restricted participation with some degree of social isolation    Allergies:  Allergies  Allergen Reactions   No Known Allergies     Metabolic Disorder Labs: Lab Results  Component Value Date   HGBA1C 6.9 (H) 02/29/2024   MPG 151.33 02/29/2024   MPG 289 09/02/2016   No results found for: PROLACTIN Lab Results  Component Value Date   CHOL 235 (H) 08/14/2017   TRIG 198 (H) 08/14/2017   HDL 49 08/14/2017   CHOLHDL 4.8 08/14/2017   LDLCALC 146 (H) 08/14/2017   No results found for: TSH  Therapeutic Level Labs: No results found for: LITHIUM No results found for: VALPROATE No results found for: CBMZ  Current Medications: Current Outpatient Medications  Medication Sig Dispense Refill   apixaban  (ELIQUIS ) 5 MG TABS tablet Take 5 mg by mouth 2 (two) times daily.     aspirin  EC 81 MG tablet Take 81 mg by mouth daily. Swallow whole.     Blood Glucose Monitoring Suppl (CONTOUR NEXT EZ) w/Device KIT 1 each by Does not apply route 3 (three) times daily. 1 kit 0   busPIRone  (BUSPAR ) 10 MG tablet  Take 1 tablet (10 mg total) by mouth 2 (two) times daily. Take daily at 8 AM and 3 PM 60 tablet 1   dicyclomine (BENTYL) 10 MG capsule Take 10 mg by mouth 3 (three) times daily before meals.     diphenoxylate-atropine (LOMOTIL) 2.5-0.025 MG tablet Take 1 tablet by mouth 4 (four) times daily as needed for diarrhea or loose stools.     glucose blood (CONTOUR NEXT TEST) test strip Use as instructed 100 each 12   Insulin  Glargine (LANTUS  SOLOSTAR) 100 UNIT/ML Solostar Pen Inject 38 Units into the skin daily at 10 pm. 5 pen 11   insulin  lispro (HUMALOG ) 100 UNIT/ML KwikPen Inject 10 Units into the skin 3 (three) times daily.     Lacosamide  150 MG TABS Take 150 mg by mouth 2 (two) times daily.     LORazepam  (ATIVAN ) 1 MG tablet Take 1 mg by mouth daily as needed for anxiety.     methocarbamol  (ROBAXIN ) 500 MG tablet Take 1,000 mg by mouth 3 (three) times daily.     MICROLET LANCETS MISC 1 each by Does not apply route 3 (three) times daily. 100 each 12   nitroGLYCERIN  (NITRODUR - DOSED IN MG/24 HR) 0.2 mg/hr patch Place 1 patch (0.2 mg total) onto the skin daily. 30 patch 12   nortriptyline  (PAMELOR ) 75 MG capsule TAKE 1 CAPSULE BY MOUTH AT BEDTIME. 90 capsule 0   pregabalin  (LYRICA ) 200 MG capsule Take 200 mg by mouth 3 (three) times daily.     Semaglutide,0.25 or 0.5MG /DOS, 2 MG/1.5ML SOPN Inject 0.25 mg into the skin once a week.     Vitamin D , Ergocalciferol , (DRISDOL) 1.25 MG (50000 UNIT) CAPS capsule Take 50,000 Units by mouth once a week.     No current facility-administered medications for this visit.     Musculoskeletal: Strength & Muscle Tone: within normal limits Gait & Station: walks with a cane Patient leans: N/A  Psychiatric Specialty Exam: Review of Systems  Psychiatric/Behavioral:  Positive for sleep disturbance. The patient is nervous/anxious.     Blood pressure 124/78, pulse (!) 104, temperature 98.6 F (37 C), temperature source Temporal, height 6' (1.829 m), weight (!) 329  lb 12.8 oz (149.6 kg), SpO2 95%.Body mass index is 44.73 kg/m.  General Appearance: Casual  Eye Contact:  Good  Speech:  Normal Rate  Volume:  Normal  Mood:  Anxious  Affect:  Congruent  Thought Process:  Goal Directed and Descriptions of Associations: Intact  Orientation:  Full (Time, Place, and Person)  Thought Content: Logical   Suicidal Thoughts:  No  Homicidal Thoughts:  No  Memory:  Immediate;   Fair Recent;   Fair Remote;   Fair  Judgement:  Fair  Insight:  Fair  Psychomotor Activity:  Normal  Concentration:  Concentration: Fair and Attention Span: Fair  Recall:  [employer] of Knowledge: Fair  Language: Fair  Akathisia:  No  Handed:  Left  AIMS (if indicated): not done  Assets:  Communication Skills Desire for Improvement Housing Social Support Transportation  ADL's:  Intact  Cognition: WNL  Sleep:  Poor   Screenings: GAD-7    Loss adjuster, chartered Office Visit from 01/04/2024 in [facility] Office Visit from 08/07/2017 in [facility] [place] - A Dept Of [place]. [facility] Office Visit from 12/26/2016 in [facility] [place] - A Dept Of [place]. [facility] Office Visit from 12/01/2016 in [facility] [place] - A Dept Of [place]. [facility] Office Visit from 11/09/2016 in [facility] [place] - A Dept Of [place]. [facility]  Total GAD-7 Score 17 1 3 4 4    PHQ2-9    Flowsheet Row Office Visit from 01/04/2024 in [facility] Nutrition from 08/30/2017 in [facility]  - A Dept Of [place]. [facility] Office Visit from 08/07/2017 in [facility] [place] - A Dept Of [place] [facility]. [facility] Office Visit from 12/26/2016 in [facility] [place] - A Dept Of [place]. [facility] Office Visit from 12/01/2016 in [facility] [place] - A Dept Of [place] [facility]. [facility]  PHQ-2 Total Score 4 0 1 1 2   PHQ-9 Total Score 16 -- 3 4 5    Flowsheet Row Office Visit from 03/28/2024 in [facility] ED to Hosp-Admission (Discharged) from 02/28/2024 in [facility] (1A) Office Visit from 01/04/2024 in [facility]  C-SSRS RISK CATEGORY No Risk No Risk No Risk     Assessment and Plan: [name] is a 58 year old Caucasian male with history of multiple medical problems as well as anxiety, insomnia was evaluated in office today, discussed assessment and plan as noted below.  Generalized anxiety disorder-unstable Currently continues to struggle with anxiety symptoms.  Has been unable to find a psychotherapist however has upcoming appointment scheduled with [facility] and is planning to keep the appointment. Continue Nortriptyline  75 mg at bedtime, initiated by neurology for neuropathy however up titrated last visit for anxiety management. Add BuSpar  10 mg twice a day. Continue Lorazepam  1 mg as needed prescribed by primary provider, uses it sparingly.  Insomnia/Obstructive sleep apnea-unstable Continues to struggle with sleep apnea, has not been able to tolerate the mask.  Continues to follow up with pulmonology. Encouraged to follow up with pulmonology for CPAP mask problem. Continue sleep hygiene techniques.  Follow-up Follow-up in clinic in 2 months or sooner in person.   Collaboration of Care: Collaboration of Care: Referral or follow-up with counselor/therapist AEB patient encouraged to establish care with therapist.  I have also communicated with staff to schedule this patient with in-house therapist, currently on a wait list.  Patient/Guardian was advised Release of Information must be obtained prior to any record release in  order to collaborate their care with an outside provider. Patient/Guardian was advised if they have not  already done so to contact the registration department to sign all necessary forms in order for us  to release information regarding their care.   Consent: Patient/Guardian gives verbal consent for treatment and assignment of benefits for services provided during this visit. Patient/Guardian expressed understanding and agreed to proceed.   This note was generated in part or whole with voice recognition software. Voice recognition is usually quite accurate but there are transcription errors that can and very often do occur. I apologize for any typographical errors that were not detected and corrected.    Shaun Kirlin, MD 03/28/2024, 12:46 PM

## 2024-04-22 ENCOUNTER — Other Ambulatory Visit: Payer: Self-pay | Admitting: Psychiatry

## 2024-04-22 DIAGNOSIS — F411 Generalized anxiety disorder: Secondary | ICD-10-CM

## 2024-04-26 ENCOUNTER — Encounter (INDEPENDENT_AMBULATORY_CARE_PROVIDER_SITE_OTHER): Payer: Self-pay

## 2024-04-26 ENCOUNTER — Encounter: Payer: Self-pay | Admitting: Internal Medicine

## 2024-04-26 ENCOUNTER — Ambulatory Visit: Admitting: Internal Medicine

## 2024-04-26 VITALS — BP 120/80 | HR 96 | Temp 98.6°F | Ht 72.0 in | Wt 322.0 lb

## 2024-04-26 DIAGNOSIS — G4733 Obstructive sleep apnea (adult) (pediatric): Secondary | ICD-10-CM

## 2024-04-26 DIAGNOSIS — Z87891 Personal history of nicotine dependence: Secondary | ICD-10-CM | POA: Diagnosis not present

## 2024-04-26 DIAGNOSIS — G4730 Sleep apnea, unspecified: Secondary | ICD-10-CM | POA: Diagnosis not present

## 2024-04-26 DIAGNOSIS — E669 Obesity, unspecified: Secondary | ICD-10-CM | POA: Diagnosis not present

## 2024-04-26 DIAGNOSIS — J309 Allergic rhinitis, unspecified: Secondary | ICD-10-CM

## 2024-04-26 DIAGNOSIS — Z6841 Body Mass Index (BMI) 40.0 and over, adult: Secondary | ICD-10-CM | POA: Diagnosis not present

## 2024-04-26 DIAGNOSIS — R0981 Nasal congestion: Secondary | ICD-10-CM

## 2024-04-26 NOTE — Progress Notes (Signed)
 Name: [name] MRN: [id number] DOB: [birth year]    CHIEF COMPLAINT:  ASSESSMENT OF SLEEP APNEA  HISTORY OF PRESENT ILLNESS: Patient is seen today for problems and issues with sleep related to excessive daytime sleepiness Patient  has been having sleep problems for many years Patient has been having excessive daytime sleepiness for a long time Patient has been having extreme fatigue and tiredness, lack of energy Patient has a diagnosis of sleep apnea currently on CPAP Discussed sleep data and reviewed with patient.  Encouraged proper weight management.  Discussed driving precautions and its relationship with hypersomnolence.  Discussed operating dangerous equipment and its relationship with hypersomnolence.  Discussed sleep hygiene, and benefits of a fixed sleep waked time.  The importance of getting eight or more hours of sleep discussed with patient.  Discussed limiting the use of the computer and television before bedtime.  Decrease naps during the day, so night time sleep will become enhanced.  Limit caffeine, and sleep deprivation.  HTN, stroke, and heart failure are potential risk factors.        04/26/2024    9:00 AM  Results of the Epworth flowsheet  Sitting and reading 2  Watching TV 3  Sitting, inactive in a public place (e.g. a theatre or a meeting) 2  As a passenger in a car for an hour without a break 3  Lying down to rest in the afternoon when circumstances permit 3  Sitting and talking to someone 1  Sitting quietly after a lunch without alcohol 2  In a car, while stopped for a few minutes in traffic 3  Total score 19   After further discussion with patient patient was diagnosed in 2018 with severe sleep apnea Patient underwent CPAP titration study and CPAP therapy however he has significant sinus issues sinus disease and nasal congestion.  This impedes his compliance  I have suggested that he gets ENT evaluation as he had no previous assessment Patient  was amendable to that suggestion and excited to see if there is anything else they can do for him for his sinuses  Patient is morbidly obese Non-smoker Nonalcoholic Does not work due to history of stroke   No exacerbation at this time No evidence of heart failure at this time No evidence or signs of infection at this time No respiratory distress No fevers, chills, nausea, vomiting, diarrhea No evidence of lower extremity edema No evidence hemoptysis  PAST MEDICAL HISTORY :   has a past medical history of Anxiety, Carotid stenosis, right, Diabetes mellitus without complication (HCC), Fracture closed, humerus, Fracture of humeral shaft, right, closed (09/02/2016), Fracture of humerus, proximal, right, closed (09/03/2016), Hyperlipidemia, Hypertension, Neuropathy, Paroxysmal atrial fibrillation (HCC), Pre-syncope, Stroke (HCC), and Vasomotor rhinitis.  has a past surgical history that includes Cholecystectomy; Wisdom tooth extraction; ORIF humerus fracture (Right, 09/02/2016); Amputation toe (Right, 06/16/2017); Cyst removal neck; cyst removal knee; Cataract extraction (Left); Lumbar stenosis; and Lumbar radiculopathy. Prior to Admission medications   Medication Sig Start Date End Date Taking? Authorizing Provider  Aspartame, For Compounding, POWD Take 4 g by mouth. 05/17/23 05/16/24 Yes [provider]  atorvastatin  (LIPITOR) 80 MG tablet Take 80 mg by mouth. 07/17/22  Yes [provider]  buPROPion (WELLBUTRIN XL) 300 MG 24 hr tablet Take 300 mg by mouth. 12/15/22  Yes [provider]  Continuous Glucose Sensor (FREESTYLE LIBRE 3 PLUS SENSOR) MISC  04/17/24  Yes [provider]  escitalopram  (LEXAPRO ) 10 MG tablet Take 1 tablet by mouth daily. 05/17/23  Yes [provider]  HYDROcodone -acetaminophen  (NORCO) 10-325 MG tablet Take 1 tablet by mouth 4 (four) times daily as needed. 04/23/24  Yes [provider]  insulin  aspart (NOVOLOG  FLEXPEN) 100  UNIT/ML FlexPen Inject 12 Units into the skin. 08/25/22  Yes [provider]  Insulin  Pen Needle (BD PEN NEEDLE NANO ULTRAFINE) 32G X 4 MM MISC Use to inject insulin  4 times daily 08/25/22  Yes [provider]  Insulin  Pen Needle 32G X 4 MM MISC Use with insulin  pen as directed 07/17/22  Yes [provider]  lisinopril  (ZESTRIL ) 20 MG tablet Take 20 mg by mouth daily. 09/15/22  Yes [provider]  magnesium  oxide (MAG-OX) 400 MG tablet Take 400 mg by mouth. 12/13/22  Yes [provider]  naloxone (NARCAN) 2 MG/2ML injection Place 1 mg into the nose as needed. For suspected opioid overdose, call 911 and spray 1 mL (half of a syringe) into EACH nostril. If no or minimal response after 3 minutes, repeat sprays. 07/24/23  Yes [provider]  nortriptyline  (PAMELOR ) 50 MG capsule Take 50 mg by mouth at bedtime. 04/16/24  Yes [provider]  tiZANidine (ZANAFLEX) 4 MG capsule Take 4 mg by mouth 3 (three) times daily as needed for muscle spasms. 09/19/22  Yes [provider]  traMADol  (ULTRAM ) 50 MG tablet Take 50 mg by mouth every 6 (six) hours as needed. 04/16/24  Yes [provider]  apixaban  (ELIQUIS ) 5 MG TABS tablet Take 5 mg by mouth 2 (two) times daily.    [provider]  aspirin  EC 81 MG tablet Take 81 mg by mouth daily. Swallow whole.    [provider]  Blood Glucose Monitoring Suppl (CONTOUR NEXT EZ) w/Device KIT 1 each by Does not apply route 3 (three) times daily. 08/21/17   Durenda Alston SAUNDERS, FNP  busPIRone  (BUSPAR ) 10 MG tablet TAKE 1 TABLET (10 MG TOTAL) BY MOUTH 2 (TWO) TIMES DAILY. TAKE DAILY AT 8 AM AND 3 PM 04/22/24   Eappen, Saramma, MD  dicyclomine (BENTYL) 10 MG capsule Take 10 mg by mouth 3 (three) times daily before meals. 12/06/23   [provider]  diphenoxylate-atropine (LOMOTIL) 2.5-0.025 MG tablet Take 1 tablet by mouth 4 (four) times daily as needed for diarrhea or loose stools.  06/01/23   [provider]  Ferrous Sulfate (IRON PO) Take 1 tablet by mouth daily.    [provider]  glucose blood (CONTOUR NEXT TEST) test strip Use as instructed 08/21/17   [name], Mandesia R, FNP  insulin  aspart (NOVOLOG ) 100 UNIT/ML injection     [provider]  Insulin  Glargine (LANTUS  SOLOSTAR) 100 UNIT/ML Solostar Pen Inject 38 Units into the skin daily at 10 pm. 08/07/17   Durenda Alston SAUNDERS, FNP  insulin  lispro (HUMALOG ) 100 UNIT/ML KwikPen Inject 10 Units into the skin 3 (three) times daily.    [provider]  Lacosamide  150 MG TABS Take 150 mg by mouth 2 (two) times daily. 02/22/24   [provider]  LORazepam  (ATIVAN ) 1 MG tablet Take 1 mg by mouth daily as needed for anxiety.    [provider]  metFORMIN  (GLUCOPHAGE ) 500 MG tablet Take 500 mg by mouth daily with breakfast.    [provider]  methocarbamol  (ROBAXIN ) 500 MG tablet Take 1,000 mg by mouth 3 (three) times daily. 04/06/23   [provider]  MICROLET LANCETS MISC 1 each by Does not apply route 3 (three) times daily. 08/21/17  Durenda Alston SAUNDERS, FNP  nitroGLYCERIN  (NITRODUR - DOSED IN MG/24 HR) 0.2 mg/hr patch Place 1 patch (0.2 mg total) onto the skin daily. 08/10/17   Zamora, Erin R, NP  oxyCODONE -acetaminophen  (PERCOCET) 10-325 MG tablet Take 1 tablet by mouth every 6 (six) hours as needed for pain.    [provider]  OZEMPIC, 1 MG/DOSE, 4 MG/3ML SOPN Inject 1 mg into the skin once a week.    [provider]  pregabalin  (LYRICA ) 150 MG capsule Take 150 mg by mouth 2 (two) times daily.    [provider]  Vitamin D , Ergocalciferol , (DRISDOL) 1.25 MG (50000 UNIT) CAPS capsule Take 50,000 Units by mouth once a week. 01/11/24   [provider]   Allergies  Allergen Reactions   No Known Allergies     FAMILY HISTORY:  family history includes CAD in his father; Diabetes in his father; Heart disease in his  father; Sjogren's syndrome in his mother. SOCIAL HISTORY:  reports that he quit smoking about 12 years ago. His smoking use included cigarettes. He has never used smokeless tobacco. He reports that he does not currently use alcohol. He reports that he does not currently use drugs after having used the following drugs: Marijuana.    BP 120/80   Pulse 96   Temp 98.6 F (37 C)   Ht 6' (1.829 m)   Wt (!) 322 lb (146.1 kg)   SpO2 98%   BMI 43.67 kg/m      Review of Systems: Gen:  Denies  fever, sweats, chills weight loss  HEENT: Denies blurred vision, double vision, ear pain, eye pain, hearing loss, nose bleeds, sore throat Cardiac:  No dizziness, chest pain or heaviness, chest tightness,edema, No JVD Resp:   No cough, -sputum production, -shortness of breath,-wheezing, -hemoptysis,  Other:  All other systems negative   Physical Examination:   General Appearance: No distress  EYES PERRLA, EOM intact.   NECK Supple, No JVD Pulmonary: normal breath sounds, No wheezing.  CardiovascularNormal S1,S2.  No m/r/g.   Abdomen: Benign, Soft, non-tender. Neurology UE/LE 5/5 strength, no focal deficits Ext pulses intact, cap refill intact ALL OTHER ROS ARE NEGATIVE  Split-night sleep study in 2018 showed AHI on his back at 72 when he sleeps on his side his AHI is 27   ASSESSMENT AND PLAN SYNOPSIS 58 year old pleasant white male seen today for assessment of severe sleep apnea in setting of morbid obesity and deconditioned state, patient with underlying severe sinus congestion and nasal issues which needs to be evaluated by ENT  Patient is amenable to ENT evaluation and assessment Once that is completed patient was asked to call us  and evaluate and discusses further plan of care I would then order an in-lab CPAP titration study for further assessment  Avoid Allergens and Irritants Avoid secondhand smoke Avoid SICK contacts Recommend  Masking  when appropriate Recommend Keep  up-to-date with vaccinations   Obesity -recommend significant weight loss -recommend changing diet  Deconditioned state -Recommend increased daily activity and exercise   MEDICATION ADJUSTMENTS/LABS AND TESTS ORDERED: ENT evaluation  Subsequent follow-up in lab CPAP titration study Recommend weight loss   CURRENT MEDICATIONS REVIEWED AT LENGTH WITH PATIENT TODAY   Patient  satisfied with Plan of action and management. All questions answered   Follow up 3 months   I spent a total of 62 minutes dedicated to the care of this patient on the date of this encounter to include pre-visit review of records, face-to-face time with  the patient discussing conditions above, post visit ordering of testing, clinical documentation with the electronic health record, making appropriate referrals as documented, and communicating necessary information to the patient's healthcare team.     Nickolas Alm Cellar, M.D.  [facility]  Medical Director [facility] Medical Director [facility] Cardio-Pulmonary Department

## 2024-04-26 NOTE — Patient Instructions (Signed)
 Recommend ENT evaluation for sinus issues Please call us  back when the evaluation is completed then we can place a in-lab CPAP titration sleep study for further evaluation  Avoid Allergens and Irritants Avoid secondhand smoke Avoid SICK contacts Recommend  Masking  when appropriate Recommend Keep up-to-date with vaccinations

## 2024-05-15 ENCOUNTER — Institutional Professional Consult (permissible substitution) (INDEPENDENT_AMBULATORY_CARE_PROVIDER_SITE_OTHER)

## 2024-05-19 ENCOUNTER — Encounter: Payer: Self-pay | Admitting: Internal Medicine

## 2024-05-20 ENCOUNTER — Telehealth (INDEPENDENT_AMBULATORY_CARE_PROVIDER_SITE_OTHER): Payer: Self-pay

## 2024-05-20 NOTE — Telephone Encounter (Signed)
 Pt called [facility] about rescheduling

## 2024-05-23 ENCOUNTER — Institutional Professional Consult (permissible substitution) (INDEPENDENT_AMBULATORY_CARE_PROVIDER_SITE_OTHER)

## 2024-05-24 ENCOUNTER — Encounter (INDEPENDENT_AMBULATORY_CARE_PROVIDER_SITE_OTHER): Payer: Self-pay

## 2024-05-24 ENCOUNTER — Institutional Professional Consult (permissible substitution) (INDEPENDENT_AMBULATORY_CARE_PROVIDER_SITE_OTHER)

## 2024-05-27 ENCOUNTER — Ambulatory Visit (INDEPENDENT_AMBULATORY_CARE_PROVIDER_SITE_OTHER): Admitting: Psychiatry

## 2024-05-27 ENCOUNTER — Other Ambulatory Visit: Payer: Self-pay

## 2024-05-27 ENCOUNTER — Encounter: Payer: Self-pay | Admitting: Psychiatry

## 2024-05-27 VITALS — BP 130/83 | HR 103 | Temp 97.1°F | Ht 72.0 in | Wt 319.2 lb

## 2024-05-27 DIAGNOSIS — G4701 Insomnia due to medical condition: Secondary | ICD-10-CM | POA: Diagnosis not present

## 2024-05-27 DIAGNOSIS — F411 Generalized anxiety disorder: Secondary | ICD-10-CM | POA: Diagnosis not present

## 2024-05-27 DIAGNOSIS — F321 Major depressive disorder, single episode, moderate: Secondary | ICD-10-CM | POA: Diagnosis not present

## 2024-05-27 MED ORDER — BUSPIRONE HCL 10 MG PO TABS: 20.0000 mg | ORAL_TABLET | Freq: Two times a day (BID) | ORAL | 0 refills | Status: AC

## 2024-05-27 NOTE — Progress Notes (Signed)
 [facility] MD OP Progress Note  05/27/2024 1:56 PM [name]  MRN:  [id number]  Chief Complaint:  Chief Complaint  Patient presents with   Follow-up   Anxiety   Depression   Medication Refill   Discussed the use of AI scribe software for clinical note transcription with the patient, who gave verbal consent to proceed.  History of Present Illness [name] is a 58 year old Caucasian male, single, disability, lives in [place], has a history of GAD, insomnia, hypertension, history of cerebellar stroke, left-sided numbness and weakness, history of syncope, atrial fibrillation, chronic pain, diabetes mellitus type 2, anxiety was evaluated in office today for a follow-up appointment.  Persistent anxiety affects him, with frequent, pervasive feelings of nervousness and dread that he refers to as micro anxieties. Physical symptoms, including shortness of breath and a constant sense of unease, often accompany these experiences, even when he does not consciously focus on specific stressors. He reports that ongoing psychosocial stressors, such as housing instability, financial loss, and the loss of social and romantic connections, contribute significantly to his anxiety. Living in a motel feels depressing and overwhelming to him, and he expresses a strong desire to secure more stable housing. He describes feeling homesick and mourns the loss of his previous lifestyle and independence.  He experiences physical and emotional exhaustion, along with an overwhelming sense of dread about the future. His sister provides support. He engages in therapy with a counselor at [facility], which he finds somewhat helpful, and uses personal coping strategies such as focusing on what he can control and trying to maintain a positive outlook, though he finds it difficult to manage his symptoms despite this awareness.  Chronic sleep difficulties persist, which he attributes to sleep apnea and chronic nasal  congestion. He describes poor sleep quality, frequent awakenings, and persistent daytime fatigue. Although he uses a CPAP machine, he finds it difficult to tolerate due to his inability to sleep on his back and ongoing nasal symptoms.   He currently takes nortriptyline  at night and buspirone  for anxiety. Nortriptyline  helps with neuropathic symptoms in his hands, such as burning and pins and needles, and he notes that these symptoms worsen if he misses doses for a few days. He does not currently take escitalopram  (Lexapro ) or bupropion (Wellbutrin), as he previously discontinued these. He continues to discuss his medication regimen with his primary care provider, and he has experienced dizziness and presyncope, though he does not attribute these symptoms to his medications.  He denies any thoughts of hurting himself or others.    Visit Diagnosis:    ICD-10-CM   1. GAD (generalized anxiety disorder)  F41.1 busPIRone  (BUSPAR ) 10 MG tablet    2. Current moderate episode of major depressive disorder without prior episode (HCC)  F32.1     3. Insomnia due to medical condition  G47.01    anxiety, osa not on cpap      Past Psychiatric History: I have reviewed past psychiatric history from progress note on 01/04/2024.  Past trials of medications like Lexapro , Wellbutrin.  Past Medical History:  Past Medical History:  Diagnosis Date   Anxiety    Carotid stenosis, right    Diabetes mellitus without complication (HCC)    Fracture closed, humerus    right   Fracture of humeral shaft, right, closed 09/02/2016   Fracture of humerus, proximal, right, closed 09/03/2016   Hyperlipidemia    Hypertension    Neuropathy    Paroxysmal atrial fibrillation (HCC)    Pre-syncope  Stroke [facility])    Vasomotor rhinitis     Past Surgical History:  Procedure Laterality Date   AMPUTATION TOE Right 06/16/2017   Procedure: AMPUTATION TOE right foot 2nd toe;  Surgeon: Harden Jerona GAILS, MD;  Location: [facility] OR;   Service: Orthopedics;  Laterality: Right;   CATARACT EXTRACTION Left    CHOLECYSTECTOMY     cyst removal knee     CYST REMOVAL NECK     Lumbar radiculopathy     Lumbar stenosis     ORIF HUMERUS FRACTURE Right 09/02/2016   Procedure: OPEN REDUCTION INTERNAL FIXATION (ORIF) RIGHT  HUMERAL SHAFT FRACTURE;  Surgeon: Ozell Bruch, MD;  Location: [facility] OR;  Service: Orthopedics;  Laterality: Right;   WISDOM TOOTH EXTRACTION      Family Psychiatric History: I have reviewed family psychiatric history from progress note on 01/04/2024.  Family History:  Family History  Problem Relation Age of Onset   Sjogren's syndrome Mother    CAD Father    Diabetes Father    Heart disease Father    Mental illness Neg Hx     Social History: I have reviewed social history from progress note on 01/04/2024. Social History   Socioeconomic History   Marital status: Single    Spouse name: Not on file   Number of children: 0   Years of education: Not on file   Highest education level: Associate degree: occupational, Scientist, product/process development, or vocational program  Occupational History   Not on file  Tobacco Use   Smoking status: Former    Current packs/day: 0.00    Types: Cigarettes    Quit date: 08/09/2011    Years since quitting: 12.8   Smokeless tobacco: Never  Vaping Use   Vaping status: Never Used  Substance and Sexual Activity   Alcohol use: Not Currently    Comment: socially    Drug use: Not Currently    Types: Marijuana    Comment: In High school   Sexual activity: Yes  Other Topics Concern   Not on file  Social History Narrative   Not on file   Social Drivers of Health   Financial Resource Strain: High Risk (01/14/2024)   Received from [facility]   Overall Financial Resource Strain (CARDIA)    Difficulty of Paying Living Expenses: Hard  Food Insecurity: High Risk (03/08/2024)   Received from [facility]   Hunger Vital Sign    Within the past 12 months, you worried that your food would run out  before you got money to buy more: Often true    Within the past 12 months, the food you bought just didn't last and you didn't have money to get more. : Often true  Transportation Needs: Unmet Transportation Needs (03/08/2024)   Received from [facility]    In the past 12 months, has lack of reliable transportation kept you from medical appointments, meetings, work or from getting things needed for daily living? : Yes  Physical Activity: Unknown (01/14/2024)   Received from [facility]   Exercise Vital Sign    On average, how many days per week do you engage in moderate to strenuous exercise (like a brisk walk)?: Patient declined    Minutes of Exercise per Session: Not on file  Stress: Stress Concern Present (01/14/2024)   Received from [facility] - Occupational Stress Questionnaire    Feeling of Stress : Very much  Social Connections: Somewhat Isolated (01/14/2024)  [facility]   Social Network    How would you rate your social network (family, work, friends)?: Restricted participation with some degree of social isolation    Allergies:  Allergies  Allergen Reactions   No Known Allergies     Metabolic Disorder Labs: Lab Results  Component Value Date   HGBA1C 6.9 (H) 02/29/2024   MPG 151.33 02/29/2024   MPG 289 09/02/2016   No results found for: PROLACTIN Lab Results  Component Value Date   CHOL 235 (H) 08/14/2017   TRIG 198 (H) 08/14/2017   HDL 49 08/14/2017   CHOLHDL 4.8 08/14/2017   LDLCALC 146 (H) 08/14/2017   No results found for: TSH  Therapeutic Level Labs: No results found for: LITHIUM No results found for: VALPROATE No results found for: CBMZ  Current Medications: Current Outpatient Medications  Medication Sig Dispense Refill   apixaban  (ELIQUIS ) 5 MG TABS tablet Take 5 mg by mouth 2 (two) times daily.     aspirin  EC 81 MG tablet Take 81 mg by mouth daily. Swallow whole.      atorvastatin  (LIPITOR) 80 MG tablet Take 80 mg by mouth.     Blood Glucose Monitoring Suppl (CONTOUR NEXT EZ) w/Device KIT 1 each by Does not apply route 3 (three) times daily. 1 kit 0   busPIRone  (BUSPAR ) 10 MG tablet Take 2 tablets (20 mg total) by mouth 2 (two) times daily. Take daily at 8 AM and 3 PM ( DOSE INCREASE) 360 tablet 0   Continuous Glucose Sensor (FREESTYLE LIBRE 3 PLUS SENSOR) MISC      dicyclomine (BENTYL) 10 MG capsule Take 10 mg by mouth 3 (three) times daily before meals.     diphenoxylate-atropine (LOMOTIL) 2.5-0.025 MG tablet Take 1 tablet by mouth 4 (four) times daily as needed for diarrhea or loose stools.     Ferrous Sulfate (IRON PO) Take 1 tablet by mouth daily.     glucose blood (CONTOUR NEXT TEST) test strip Use as instructed 100 each 12   HYDROcodone -acetaminophen  (NORCO) 10-325 MG tablet Take 1 tablet by mouth 4 (four) times daily as needed.     insulin  aspart (NOVOLOG  FLEXPEN) 100 UNIT/ML FlexPen Inject 12 Units into the skin.     insulin  aspart (NOVOLOG ) 100 UNIT/ML injection      Insulin  Glargine (LANTUS  SOLOSTAR) 100 UNIT/ML Solostar Pen Inject 38 Units into the skin daily at 10 pm. 5 pen 11   insulin  lispro (HUMALOG ) 100 UNIT/ML KwikPen Inject 10 Units into the skin 3 (three) times daily.     Insulin  Pen Needle (BD PEN NEEDLE NANO ULTRAFINE) 32G X 4 MM MISC Use to inject insulin  4 times daily     Insulin  Pen Needle 32G X 4 MM MISC Use with insulin  pen as directed     Lacosamide  150 MG TABS Take 150 mg by mouth 2 (two) times daily.     lisinopril  (ZESTRIL ) 20 MG tablet Take 20 mg by mouth daily.     LORazepam  (ATIVAN ) 1 MG tablet Take 1 mg by mouth daily as needed for anxiety.     magnesium  oxide (MAG-OX) 400 MG tablet Take 400 mg by mouth.     metFORMIN  (GLUCOPHAGE ) 500 MG tablet Take 500 mg by mouth daily with breakfast.     methocarbamol  (ROBAXIN ) 500 MG tablet Take 1,000 mg by mouth 3 (three) times daily.     MICROLET LANCETS MISC 1 each by Does not  apply route 3 (three) times daily. 100 each  12   naloxone (NARCAN) 2 MG/2ML injection Place 1 mg into the nose as needed. For suspected opioid overdose, call 911 and spray 1 mL (half of a syringe) into EACH nostril. If no or minimal response after 3 minutes, repeat sprays.     nitroGLYCERIN  (NITRODUR - DOSED IN MG/24 HR) 0.2 mg/hr patch Place 1 patch (0.2 mg total) onto the skin daily. 30 patch 12   nortriptyline  (PAMELOR ) 50 MG capsule Take 50 mg by mouth at bedtime.     oxyCODONE -acetaminophen  (PERCOCET) 10-325 MG tablet Take 1 tablet by mouth every 6 (six) hours as needed for pain.     OZEMPIC, 1 MG/DOSE, 4 MG/3ML SOPN Inject 1 mg into the skin once a week.     pregabalin  (LYRICA ) 150 MG capsule Take 150 mg by mouth 2 (two) times daily.     tiZANidine (ZANAFLEX) 4 MG capsule Take 4 mg by mouth 3 (three) times daily as needed for muscle spasms.     traMADol  (ULTRAM ) 50 MG tablet Take 50 mg by mouth every 6 (six) hours as needed.     Vitamin D , Ergocalciferol , (DRISDOL) 1.25 MG (50000 UNIT) CAPS capsule Take 50,000 Units by mouth once a week.     No current facility-administered medications for this visit.     Musculoskeletal: Strength & Muscle Tone: within normal limits Gait & Station: walks with cane Patient leans: N/A  Psychiatric Specialty Exam: Review of Systems  Psychiatric/Behavioral:  Positive for dysphoric mood and sleep disturbance. The patient is nervous/anxious.     Blood pressure 130/83, pulse (!) 103, temperature (!) 97.1 F (36.2 C), temperature source Temporal, height 6' (1.829 m), weight (!) 319 lb 3.2 oz (144.8 kg).Body mass index is 43.29 kg/m.  General Appearance: Casual  Eye Contact:  Fair  Speech:  Clear and Coherent  Volume:  Normal  Mood:  Anxious and Depressed  Affect:  Congruent  Thought Process:  Goal Directed and Descriptions of Associations: Intact  Orientation:  Full (Time, Place, and Person)  Thought Content: Logical   Suicidal Thoughts:  No   Homicidal Thoughts:  No  Memory:  Immediate;   Fair Recent;   Fair Remote;   Fair  Judgement:  Fair  Insight:  Fair  Psychomotor Activity:  Normal  Concentration:  Concentration: Fair and Attention Span: Fair  Recall:  [employer] of Knowledge: Fair  Language: Fair  Akathisia:  No  Handed:  Left  AIMS (if indicated): not done  Assets:  Communication Skills Desire for Improvement Housing Social Support Transportation  ADL's:  Intact  Cognition: WNL  Sleep:  Poor due to CPAP issue   Screenings: GAD-7    Loss adjuster, chartered Office Visit from 05/27/2024 in [facility] Office Visit from 01/04/2024 in [facility] Office Visit from 08/07/2017 in [facility] [place] - A Dept Of [place] Office Visit from 12/26/2016 in [facility] [place] - A Dept Of [place] [facility]. [facility] Office Visit from 12/01/2016 in [facility] [place] - A Dept Of [place] [facility]. [facility]  Total GAD-7 Score 18 17 1 3 4    PHQ2-9    Flowsheet Row Office Visit from 05/27/2024 in [facility] Office Visit from 01/04/2024 in [facility] Nutrition from 08/30/2017 in [facility]  - A Dept Of . [facility] Office Visit from  08/07/2017 in [facility] [place] - A Dept Of [place] [facility]. [facility] Office Visit from 12/26/2016 in [facility] [place] - A Dept Of [place] [facility]. [facility]  PHQ-2 Total Score 5 4 0 1 1  PHQ-9 Total Score 14 16 -- 3 4   Flowsheet Row Office Visit from 05/27/2024 in [facility] Office Visit from 03/28/2024 in [facility] ED to Hosp-Admission (Discharged) from 02/28/2024 in [facility] (1A)  C-SSRS RISK  CATEGORY No Risk No Risk No Risk     Assessment and Plan: Carthel Castille is a 58 year old Caucasian male with history of multiple medical problems presented for a follow-up appointment.  Discussed assessment and plan as noted below.  1. GAD (generalized anxiety disorder)-unstable Ongoing anxiety symptoms mostly due to situational stressors.  Currently tolerating BuSpar  well.  Interested in dosage adjustment. Increase BuSpar  to 20 mg twice a day Continue Nortriptyline  75 mg at bedtime initiated by neurology for neuropathy although it helps with anxiety and depression. Continue Lorazepam  1 mg as prescribed by primary care provider uses it sparingly Encouraged to continue psychotherapy sessions with therapist at [facility]  2. Current moderate episode of major depressive disorder without prior episode (HCC)-unstable Does report ongoing depression symptoms. Increase BuSpar  to 20 mg twice a day Continue Nortriptyline  as prescribed Continue CBT  3. Insomnia due to medical condition-unstable Sleep problems mostly due to CPAP mask problems, nasal stuffiness, need to void at night, pain Encouraged good sleep hygiene techniques Encouraged to follow-up with pulmonology for CPAP mask problem.  Follow-up Follow-up in clinic in 2 months or sooner in person.    Collaboration of Care: Collaboration of Care: Referral or follow-up with counselor/therapist AEB encouraged to continue psychotherapy sessions.  Patient also with elevated blood pressure reading in session today, repeat blood pressure downtrending.  Patient to follow-up with primary care provider for management of the same.  Patient/Guardian was advised Release of Information must be obtained prior to any record release in order to collaborate their care with an outside provider. Patient/Guardian was advised if they have not already done so to contact the registration department to sign all necessary forms in order for us  to release information  regarding their care.   Consent: Patient/Guardian gives verbal consent for treatment and assignment of benefits for services provided during this visit. Patient/Guardian expressed understanding and agreed to proceed.   This note was generated in part or whole with voice recognition software. Voice recognition is usually quite accurate but there are transcription errors that can and very often do occur. I apologize for any typographical errors that were not detected and corrected.    Jamiee Milholland, MD 05/27/2024, 1:56 PM

## 2024-05-29 ENCOUNTER — Encounter (INDEPENDENT_AMBULATORY_CARE_PROVIDER_SITE_OTHER): Payer: Self-pay

## 2024-05-29 ENCOUNTER — Ambulatory Visit (INDEPENDENT_AMBULATORY_CARE_PROVIDER_SITE_OTHER)

## 2024-05-29 VITALS — BP 106/69 | HR 106 | Ht 72.0 in | Wt 300.0 lb

## 2024-05-29 DIAGNOSIS — J343 Hypertrophy of nasal turbinates: Secondary | ICD-10-CM

## 2024-05-29 DIAGNOSIS — Z789 Other specified health status: Secondary | ICD-10-CM

## 2024-05-29 DIAGNOSIS — J300 Vasomotor rhinitis: Secondary | ICD-10-CM

## 2024-05-29 DIAGNOSIS — J3489 Other specified disorders of nose and nasal sinuses: Secondary | ICD-10-CM

## 2024-05-29 DIAGNOSIS — J342 Deviated nasal septum: Secondary | ICD-10-CM | POA: Diagnosis not present

## 2024-05-29 MED ORDER — MUPIROCIN 2 % EX OINT: 1.0000 | TOPICAL_OINTMENT | Freq: Two times a day (BID) | CUTANEOUS | 0 refills | Status: AC

## 2024-05-29 MED ORDER — IPRATROPIUM BROMIDE 0.03 % NA SOLN: 2.0000 | Freq: Two times a day (BID) | NASAL | 12 refills | Status: AC

## 2024-05-29 NOTE — Progress Notes (Signed)
 Dear Dr. Isaiah, Here is my assessment for our mutual patient, [name]. Thank you for allowing me the opportunity to care for your patient. Please do not hesitate to contact me should you have any other questions. Sincerely, Dr. Hadassah Parody  Otolaryngology Clinic Note Referring provider: Dr. Isaiah HPI:   Initial HPI (05/29/2024) Discussed the use of AI scribe software for clinical note transcription with the patient, who gave verbal consent to proceed.  History of Present Illness [name] is a 58 year old male who presents with difficulty tolerating CPAP due to nasal obstruction.  Nasal congestion and rhinorrhea - Lifelong nasal congestion with constant runny nose and clear discharge - Requires frequent nose blowing - No purulent nasal discharge - No history of sinus infections  Nasal obstruction and cpap intolerance - Difficulty breathing through the nose, especially when using CPAP machine - Nasal obstruction causes pressure and discomfort during CPAP use - Symptoms improve when sleeping on his side  Prior treatments for nasal symptoms - Extensive prior use of Afrin nasal spray, which was effective - he has since discontinued this.  - Atrovent nasal spray prescribed eight years ago, thinks this was ineffective but not sure how consistently he took the medication   Imaging and surgical history - CT scan of the head performed in July - No history of sinus surgeries    Independent Review of Additional Tests or Records:  Referral note Nickolas Isaiah, MD (04/26/24): sleep apnea on cpap. Nasal congestion impedes compliance with cpap   CT head 02/28/24 independently reviewed and shows leftward nasal septal deviation. No significant sinus disease  PMH/Meds/All/SocHx/FamHx/ROS:   Past Medical History:  Diagnosis Date   Anxiety    Carotid stenosis, right    Diabetes mellitus without complication (HCC)    Fracture closed, humerus    right   Fracture of  humeral shaft, right, closed 09/02/2016   Fracture of humerus, proximal, right, closed 09/03/2016   Hyperlipidemia    Hypertension    Neuropathy    Paroxysmal atrial fibrillation (HCC)    Pre-syncope    Stroke [facility])    Vasomotor rhinitis      Past Surgical History:  Procedure Laterality Date   AMPUTATION TOE Right 06/16/2017   Procedure: AMPUTATION TOE right foot 2nd toe;  Surgeon: Harden Jerona GAILS, MD;  Location: [facility] OR;  Service: Orthopedics;  Laterality: Right;   CATARACT EXTRACTION Left    CHOLECYSTECTOMY     cyst removal knee     CYST REMOVAL NECK     Lumbar radiculopathy     Lumbar stenosis     ORIF HUMERUS FRACTURE Right 09/02/2016   Procedure: OPEN REDUCTION INTERNAL FIXATION (ORIF) RIGHT  HUMERAL SHAFT FRACTURE;  Surgeon: Ozell Bruch, MD;  Location: [facility] OR;  Service: Orthopedics;  Laterality: Right;   WISDOM TOOTH EXTRACTION      Family History  Problem Relation Age of Onset   Sjogren's syndrome Mother    CAD Father    Diabetes Father    Heart disease Father    Mental illness Neg Hx      Social Connections: Somewhat Isolated (01/14/2024)   Received [facility]   Social Network    How would you rate your social network (family, work, friends)?: Restricted participation with some degree of social isolation     Current Outpatient Medications  Medication Instructions   apixaban  (ELIQUIS ) 5 mg, 2 times daily   aspirin  EC 81 mg, Daily   atorvastatin  (LIPITOR) 80 mg  Blood Glucose Monitoring Suppl (CONTOUR NEXT EZ) w/Device KIT 1 each, Does not apply, 3 times daily   busPIRone  (BUSPAR ) 20 mg, Oral, 2 times daily, Take daily at 8 AM and 3 PM ( DOSE INCREASE)   Continuous Glucose Sensor (FREESTYLE LIBRE 3 PLUS SENSOR) MISC    dicyclomine (BENTYL) 10 mg, 3 times daily before meals   diphenoxylate-atropine (LOMOTIL) 2.5-0.025 MG tablet 1 tablet, 4 times daily PRN   Ferrous Sulfate (IRON PO) 1 tablet, Daily   glucose blood (CONTOUR NEXT TEST) test strip Use as  instructed   HYDROcodone -acetaminophen  (NORCO) 10-325 MG tablet 1 tablet, 4 times daily PRN   insulin  aspart (NOVOLOG ) 100 UNIT/ML injection    insulin  glargine (LANTUS  SOLOSTAR) 38 Units, Subcutaneous, Daily at 10 pm   insulin  lispro (HUMALOG ) 10 Units, 3 times daily   Insulin  Pen Needle (BD PEN NEEDLE NANO ULTRAFINE) 32G X 4 MM MISC Use to inject insulin  4 times daily   Insulin  Pen Needle 32G X 4 MM MISC Use with insulin  pen as directed   ipratropium (ATROVENT) 0.03 % nasal spray 2 sprays, Each Nare, Every 12 hours   Lacosamide  150 mg, 2 times daily   lisinopril  (ZESTRIL ) 20 mg, Daily   LORazepam  (ATIVAN ) 1 mg, Daily PRN   magnesium  oxide (MAG-OX) 400 mg   metFORMIN  (GLUCOPHAGE ) 500 mg, Daily with breakfast   methocarbamol  (ROBAXIN ) 1,000 mg, 3 times daily   MICROLET LANCETS MISC 1 each, Does not apply, 3 times daily   mupirocin  ointment (BACTROBAN ) 2 % 1 Application, Topical, 2 times daily   naloxone (NARCAN) 1 mg, As needed   nitroGLYCERIN  (NITRODUR - DOSED IN MG/24 HR) 0.2 mg, Transdermal, Daily   nortriptyline  (PAMELOR ) 50 mg, Daily at bedtime   NovoLOG  FlexPen 12 Units   oxyCODONE -acetaminophen  (PERCOCET) 10-325 MG tablet 1 tablet, Every 6 hours PRN   Ozempic (1 MG/DOSE) 1 mg, Weekly   pregabalin  (LYRICA ) 150 mg, 2 times daily   tiZANidine (ZANAFLEX) 4 mg, 3 times daily PRN   traMADol  (ULTRAM ) 50 mg, Every 6 hours PRN   Vitamin D  (Ergocalciferol ) (DRISDOL) 50,000 Units, Weekly     Physical Exam:   BP 106/69 (BP Location: Left Arm, Patient Position: Sitting)   Pulse (!) 106   Ht 6' (1.829 m)   Wt 300 lb (136.1 kg)   SpO2 95%   BMI 40.69 kg/m   Salient findings:  CN II-XII intact   Bilateral EAC clear and TM intact with well pneumatized middle ear spaces Anterior rhinoscopy: Septum sharp leftward deviation anteriorly; bilateral inferior turbinates with hypertrophy No lesions of oral cavity/oropharynx No obviously palpable neck masses/lymphadenopathy/thyromegaly No  respiratory distress or stridor  Seprately Identifiable Procedures:  Prior to initiating any procedures, risks/benefits/alternatives were explained to the patient and verbal consent obtained.  Impression & Plans:  [name] is a 58 y.o. male with   1. Nasal obstruction   2. Nasal septal deviation   3. Hypertrophy of both inferior nasal turbinates   4. Vasomotor rhinitis   5. Nasal crusting   6. Intolerance of continuous positive airway pressure (CPAP) ventilation    Assessment and Plan Assessment & Plan Deviated nasal septum with CPAP intolerance Bilateral inferior turbinate hypertrophy  Significant septal deviation affecting CPAP use. - Schedule septoplasty. - Obtain clearance to stop Eliquis  (PCP and cardiologist)  - Needs anesthesia pre-op visit   Vasomotor rhinitis  Chronic rhinitis with persistent discharge and congestion - Prescribe Atrovent nasal spray.  Nasal septal crusting and irritation - Prescribe  antibiotic ointment for nasal irritation, apply twice daily for one week.  We discussed the goals of septoplasty and turbinate reduction, and expectations for postoperative management. Will plan to leave splints in place, and removal was also discussed. We also discussed nasal obstruction post-operatively until splints in place and pain management.  We discussed R/B/A including pain, infection, bleeding (~5% risk of operative visit for control), persistent symptoms, need for revision surgery, and other risks including damage to surrounding structures, septal perforation, among others.   Patient understands and is ready to proceed.   See below regarding exact medications prescribed this encounter including dosages and route: Meds ordered this encounter  Medications   ipratropium (ATROVENT) 0.03 % nasal spray    Sig: Place 2 sprays into both nostrils every 12 (twelve) hours.    Dispense:  30 mL    Refill:  12   mupirocin  ointment (BACTROBAN ) 2 %    Sig: Apply 1  Application topically 2 (two) times daily for 7 days.    Dispense:  14 g    Refill:  0    Burnard triplliett PCP eliquis   Camellia Custard - neurologist   Thank you for allowing me the opportunity to care for your patient. Please do not hesitate to contact me should you have any other questions.  Sincerely, Hadassah Parody, MD Otolaryngologist (ENT), [facility] Phone: [phone] Fax: [phone]

## 2024-05-29 NOTE — Progress Notes (Signed)
 Hello,  I'm a population health pharmacist and I spoke with this patient regarding their medications. Patient needs refills for insulin  lispro and semaglutide. I would like to mention the patient is not currently taking insulin  lispro 10 units with meals, instead he is taking sliding scale insulin  lispro with meals.  I have pended both scripts for you. Please make changes to the insulin  lispro if needed. Please consider signing the orders if appropriate.  Thanks! [name], [facility]

## 2024-05-30 ENCOUNTER — Encounter (INDEPENDENT_AMBULATORY_CARE_PROVIDER_SITE_OTHER): Payer: Self-pay

## 2024-06-02 NOTE — Addendum Note (Signed)
 Addended by: Krisha Beegle on: 06/02/2024 09:58 PM   Modules accepted: Orders

## 2024-06-04 ENCOUNTER — Telehealth (INDEPENDENT_AMBULATORY_CARE_PROVIDER_SITE_OTHER): Payer: Self-pay

## 2024-06-04 NOTE — Telephone Encounter (Signed)
 Followed up with PCP for surgical clearance.

## 2024-06-04 NOTE — Telephone Encounter (Signed)
 Called cardiologist to confirm surgical clearance.

## 2024-06-21 ENCOUNTER — Telehealth (INDEPENDENT_AMBULATORY_CARE_PROVIDER_SITE_OTHER): Payer: Self-pay

## 2024-06-21 NOTE — Telephone Encounter (Signed)
 PT LVM THAt he has up coming surgery w/Masciello. She gave pt Mupirocin  ointment to use before surgery. He has questions on how to use it, and can he start today.

## 2024-06-24 ENCOUNTER — Encounter (INDEPENDENT_AMBULATORY_CARE_PROVIDER_SITE_OTHER): Payer: Self-pay

## 2024-07-03 ENCOUNTER — Encounter (INDEPENDENT_AMBULATORY_CARE_PROVIDER_SITE_OTHER)

## 2024-07-16 NOTE — H&P (View-Only) (Signed)
 Cardiology Clinic  History of Present Illness The patient is a 58 year old male who presents for a routine follow-up.   Patient's care has been split between [facility] and [facility] and it has been challenging to sort through his clinical presentation and treatment.    He initially presented with lightheadedness at the last clinic visit.  He states onset of lightheadedness started after CVA in 2023.  An echocardiogram was ordered as part of the evaluation and was not scheduled but it is now scheduled for next week.  ECHO 2024 had EF of 65-70%, no WMA or valvular pathology.   Believe patient's symptom was addressed at PCP office at [facility] and a NMST was ordered.  This was performed November 2025 and this revealed a medium sized severe intensity reversible perfusion defect of the inferior wall.  Calculated EF was 66%.  Wall motion there was inferior wall hypokinesis.    Patient presents today reporting no chest pain, SOB at rest or with exertion, palpitations, feeling irregular heartbeat or fast heartbeat, no fatigue or exercise intolerance.  No overt bleeding issues or excessive bruising while on anticoagulation.  His CC- lightheadedness with standing and presyncope w/o passing out.   Lightheadedness occurs when standing up, either immediately or up to 20 minutes after standing, but not while sitting. Orthostatic blood pressure testing revealed a drop in blood pressure upon standing today.  He is off all antihypertensive medications to while lisinopril  was discontinued six months ago. He maintains hydration by drinking water daily and adds salt to his food. He does not currently use compression socks but is considering their use. He has a home blood pressure monitor and plans to check his blood pressure twice daily, both while sitting and standing.   Had CTA neck 05/2024 and had no carotid disease bilaterally.  Vertebral arteries had absent opacification of left vertebral artery and V1 segment with irregular discontinuous  reconstitution of the V2 segment and thready reconstitution of the V3 segment. Posterior circulation: Absent opacification of the left vertebral artery proximal V4 segment with distal reconstitution, possibly via retrograde flow across the vertebrobasilar junction. No aneurysm.   NMST revealed an incidental finding of a cavitary right upper lobe opacity and short-term follow-up CT chest.  He reports a CT scan has been scheduled at [facility].    He has a tilt table test scheduled in a few weeks.   Patient has type 2 diabetes with improvement hemoglobin A1c.  Last year his hemoglobin A1c was 12.4 and currently 7.9.  He currently is taking Ozempic.  He has lost about 10 pounds.   He states atorvastatin  was stopped for reasons unknown.  Did review LFTs and did not have transaminitis.  However, statin was restarted 2 months and increased Lipitor from 40 mg to 80 mg daily.  Cholesterol October 2025 revealed a LDL of 172, cholesterol 254, triglycerides 201 and LDLs 45.   Patient reports he scheduled for repeat blood work in 2 weeks.    SOCIAL HISTORY Exercise: Takes a little walk every day  Review of Systems As stated in HPI  [Past Medical History]   [Past Medical History] Diagnosis Date   Arthritis    Cerebellar stroke, acute (*) 07/13/2022   Diabetes mellitus (*)    Hypertension    [Past Surgical History]  [Past Surgical History] Procedure Laterality Date   Abdominal surgery     Fracture surgery      [Family History]   [Family History] Problem Relation Name Age of Onset   No  Known Problems Father      Mother      [Current Medications]   [Current Medications]  Current Outpatient Medications:    aspirin  (ECOTRIN LOW DOSE) EC tablet, Take one tablet (81 mg dose) by mouth daily., Disp: , Rfl:    atorvastatin  (LIPITOR) 80 mg tablet, Take one tablet (80 mg dose) by mouth at bedtime., Disp: 30 tablet, Rfl: 1   busPIRone  (BUSPAR ) 10 mg tablet, Take two tablets (20 mg dose) by  mouth., Disp: , Rfl:    Continuous Glucose Sensor (FREESTYLE LIBRE 3 PLUS SENSOR) MISC, , Disp: , Rfl:    dicyclomine (BENTYL) 10 mg capsule, Take ten mg by mouth., Disp: , Rfl:    ELIQUIS  5 MG tablet, TAKE 1 TABLET BY MOUTH TWICE A DAY, Disp: 180 tablet, Rfl: 1   ergocalciferol  (VITAMIN D2) 50,000 units CAPS capsule, Take one capsule (50,000 Units dose) by mouth once a week at 0900., Disp: , Rfl:    ESCITALOPRAM  OXALATE PO, Take 5 mg by mouth daily., Disp: , Rfl:    HYDROcodone -acetaminophen  (NORCO) 10-325 mg per tablet, Take one tablet by mouth 4 (four) times a day as needed., Disp: , Rfl:    Insulin  Glargine (BASAGLAR  KWIKPEN) 100 Unit/mL SOPN, Inject thirty five Units into the skin daily., Disp: 15 mL, Rfl: 0   insulin  lispro, 1 Unit Dial, (HUMALOG  KWIKPEN) 100 Units/mL SOPN injection PEN, Inject ten Units to eighteen Units into the skin., Disp: , Rfl:    ketorolac  tromethamine  (ACULAR ) 0.5% ophthalmic solution, Apply one drop to eye 4 (four) times a day., Disp: , Rfl:    LORazepam  (ATIVAN ) 1 mg tablet, Take one tablet (1 mg dose) by mouth daily as needed., Disp: , Rfl:    magnesium  oxide 400 TABS, Take one tablet (400 mg dose) by mouth daily. You can increase to twice per day if needed, Disp: 180 tablet, Rfl: 3   methocarbamol  (ROBAXIN ) 750 mg tablet, Take 1,000 mg by mouth 3 (three) times a day., Disp: , Rfl:    Methocarbamol  1000 MG TABS, Take 1,000 mg by mouth with meals as needed., Disp: , Rfl:    metoprolol tartrate (LOPRESSOR) 100 mg tablet, Take one tablet (100 mg dose) by mouth once for 1 dose., Disp: 1 tablet, Rfl: 0   midodrine HCl (PROAMATINE) 5 mg tablet, Take one tablet (5 mg dose) by mouth 3 (three) times a day for 30 days., Disp: 90 tablet, Rfl: 0   nortriptyline  HCl (PAMELOR ) 10 mg capsule, Take three capsules (30 mg dose) by mouth at bedtime., Disp: 270 capsule, Rfl: 0   nortriptyline  HCl (PAMELOR ) 50 mg capsule, TAKE ONE CAPSULE (50 MG DOSE) BY MOUTH AT  BEDTIME., Disp: 90 capsule, Rfl: 1   nortriptyline  HCl (PAMELOR ) 75 mg capsule, Take one capsule (75 mg dose) by mouth at bedtime., Disp: 90 capsule, Rfl: 1   nortriptyline  HCl (PAMELOR ) 75 mg capsule, Take one capsule (75 mg dose) by mouth at bedtime., Disp: 30 capsule, Rfl: 1   NOVOLOG  FLEXPEN 100 UNIT/ML injection, Inject twelve Units into the skin 15 (fifteen) minutes before meals., Disp: 5 each, Rfl: 0   OZEMPIC, 0.25 OR 0.5 MG/DOSE, 2 MG/3ML SOPN, Inject 0.25 mg into the skin once a week. MONDAY, Disp: , Rfl:    pregabalin  (LYRICA ) 150 mg capsule, TAKE ONE CAPSULE (150 MG DOSE) BY MOUTH 2 (TWO) TIMES DAILY. MAX DAILY AMOUNT: 300 MG, Disp: 60 capsule, Rfl: 2   traZODone (DESYREL) 50 MG tablet, Take one tablet (50  mg dose) by mouth with breakfast as needed for Sleep., Disp: , Rfl:   Objective  Blood pressure 120/82, pulse 93, height 6' (1.829 m), weight (!) 323 lb (146.5 kg), SpO2 94%. Standing BP 102/54 (patient became lightheaded after 5 minutes of standing) Physical Exam General: No acute distress.  Morbidly obese. Heart: Regular rhythm and rate, no murmur Lungs: Clear throughout fields bilaterally no adventitious lung sounds.   Extremities: No lower extremity swelling. Neuro: Alert and oriented x 3, no focal deficits, unsteady gait ambulating with cane  Results  Diagnostic Testing - Event monitor: 07/2023, No A-fib, rare ventricular PVCs, PACs, and nonsustained atrial tachycardia  Assessment & Plan Orthostatic hypotension Believe symptoms are related from orthostatic changes as above and this may be result of autonomic dysfunction due to diabetes.  Will begin midodrine 5 mg 3 times per day.   Check CMP in 4-6 weeks..  Instructed to trend blood pressures both sitting and standing including pulse and provide these trends in 2 weeks here in clinic.   Will cancel tilt table test.    Abnormal stress test Patient did have a medium size reversible defect of the inferior lateral  region. Reviewed options with patient from noninvasive to invasive and after lengthy discussion agreed to proceed with left heart catheterization after reviewing risk and benefits..   Will optimize medication regimen based on the results of heart catheterization.   Review pending ECHO.  The risks and benefits of cardiac catheterization were discussed with the patient including, but not limited to bleeding, infection, heart attack, kidney failure, stroke, or death. The patient wishes to proceed. We maintain scheduled cardiac catheterization and potential intervention.  Had no further questions upon this review.  There are no contra-indications for DES if indicated and right radial approach ok.    Hyperlipidemia Patient on high-dose statin therapy.  Check labs in 6 to 8 weeks including LFTs  Type 2 diabetes Patient's hemoglobin A1c is improving.  Will defer continued management to PCP.  Paroxysmal atrial fibrillation without recurrence. wore a monitor about a year ago, which showed no reoccurence continue Eliquis  5 mg BID.   Cavitary right upper lobe opacity and tree in bud nodularity bilateral lung bases.   FU CT scan scheduled at [facility].  Will FU with results.   7.  Hx of CVA - continue Lipitor and ASA.    Follow up for print AVS.  AI technology was used in creating this visit note. Verbal consent from the patient/caregiver was obtained prior to its use.   Corean DELENA Savannah, NP

## 2024-07-17 ENCOUNTER — Ambulatory Visit (INDEPENDENT_AMBULATORY_CARE_PROVIDER_SITE_OTHER)

## 2024-07-29 ENCOUNTER — Encounter (INDEPENDENT_AMBULATORY_CARE_PROVIDER_SITE_OTHER)

## 2024-07-29 NOTE — Interval H&P Note (Signed)
 H&P reviewed, patient examined and there is NO CHANGE to the patient status.

## 2024-07-29 NOTE — Nursing Note (Signed)
 Pt and sister Karilyn state understanding of discharge instructions and questions answered.  Instructed pt on post-op care, s/s of infection, bleeding, restrictions, medications, and follow-up appointments.  Pt's sister Karilyn to drive him home.

## 2024-07-31 NOTE — Progress Notes (Signed)
 Patient healed and AVS given.

## 2024-07-31 NOTE — Progress Notes (Signed)
 [facility]  Referred by: Godfrey Lyle Norris, DPM  Chief Complaint: Wound  PMH Includes: - DM2 - Atrial fibrillation - CVA - Morbid obesity  Initial History 06/06/24:  Here for evaluation of bilateral feet ulcerations. The ulcerations began in June after a syncopal episode. He was on the floor and rubbing his feet against the ground. He has not been doing much to the areas until recently. He is using aquacell. He is wearing a post-op shoe. He is laying on his R side.  No fevers or chills.   Saw Dr. Lesavage on 05-21-24. F/u of wounds on b/l feet. No evidence of infection. Repeat non-invasives. Use ofloading shoe.  Relevant work up/results: ABI 06-2023   A1c 7.9 05-20-24.  CBC with hgb 14.1 05-20-24  Interval history/progress: Initial visit - see above  06-13-24 Wounds are improved. He is feeling well.   Got updated ABI   06-27-24 Wounds are improved today. Smaller. No infection.   07-01-24 Right ankle is improved.  He is noticing progress. Trying to sleep on left side.   07-08-24 R-ankle wound continuing to improve. No new concerns.   07-15-24 Ankle is healing. Smaller.   07-22-24 Wound on ankle is healed.   12/24: 1.5wk f/u.  No new concerns.    Vitals:   07/31/24 0806  BP: 125/61  Pulse: 97  Resp: 18  Temp: 97.7 F (36.5 C)     Exam: Pt is AA in NAD. Oriented  Wound(s) exam: Left medial foot: Remains healed  Right lateral ankle:  Healed, fragile  WEA treatment done today.        Assessment Encounter Diagnoses  Name Primary?   Diabetic ulcer of right ankle    (CMD) Yes      Ulceration of right ankle and left midfoot from pressure.  Left midfoot remains healed. R ankle is healed. Will pad one more week   PLAN: -Healed.  No further dressings required. - Recommend moisturizer.  Reviewed WEA as an option and how to get this.  Also reviewed scar maturation and gave samples of silicone sheeting to  wear at nighttime. - Encouraged continued high-protein diet and multivitamin use and leg elevation - Follow-up as needed    No orders of the defined types were placed in this encounter.   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 Kristin Godwin, retina fellow   Diabetic macular edema of right eye    (CMD) 03/08/2023   Eating disorder 1990   Heart murmur 1978   Jaundice 1968   Mature cataract, right eye 12/13/2022   Dr. Devere Burden   Morbid obesity with body mass index (BMI) of 40.0 to  44.9 in adult (CMD) 10/24/2022   Nuclear sclerosis of left eye 12/13/2022   Dr. 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

## 2024-08-02 NOTE — Telephone Encounter (Signed)
 [name], Pt was last seen on 07/08/2024, ok to complete?

## 2024-08-05 ENCOUNTER — Ambulatory Visit (INDEPENDENT_AMBULATORY_CARE_PROVIDER_SITE_OTHER)

## 2024-08-05 DIAGNOSIS — J343 Hypertrophy of nasal turbinates: Secondary | ICD-10-CM | POA: Diagnosis not present

## 2024-08-05 DIAGNOSIS — J342 Deviated nasal septum: Secondary | ICD-10-CM | POA: Diagnosis not present

## 2024-08-05 DIAGNOSIS — J3489 Other specified disorders of nose and nasal sinuses: Secondary | ICD-10-CM | POA: Diagnosis not present

## 2024-08-06 NOTE — Progress Notes (Signed)
 " Otolaryngology Clinic Phone Note Referring provider: Dr. Herlinda HPI:   (08/05/2024) Pre-op phone note.  Patient location: [place] Provider location: clinic  Time spent on phone: 15 min  Phone note prior to surgery.  His last case was canceled because he needed further cardiac workup prior to surgery.  We then rescheduled him but I do not see any documentation that he is cleared for surgery.  He is also on Eliquis  currently.  Discussed with him that I will need to reach out to his cardiologist to get clearance for surgery and to have a timeline for holding his Eliquis .  Discussed that this may delay his surgery if we are not able to get clearance prior.  He is okay with this.  Independent Review of Additional Tests or Records:  none   PMH/Meds/All/SocHx/FamHx/ROS:   Past Medical History:  Diagnosis Date   Anxiety    Carotid stenosis, right    Diabetes mellitus without complication (HCC)    Fracture closed, humerus    right   Fracture of humeral shaft, right, closed 09/02/2016   Fracture of humerus, proximal, right, closed 09/03/2016   Hyperlipidemia    Hypertension    Neuropathy    Paroxysmal atrial fibrillation (HCC)    Pre-syncope    Stroke [facility])    Vasomotor rhinitis      Past Surgical History:  Procedure Laterality Date   AMPUTATION TOE Right 06/16/2017   Procedure: AMPUTATION TOE right foot 2nd toe;  Surgeon: Harden Jerona GAILS, MD;  Location: [facility] OR;  Service: Orthopedics;  Laterality: Right;   CATARACT EXTRACTION Left    CHOLECYSTECTOMY     cyst removal knee     CYST REMOVAL NECK     Lumbar radiculopathy     Lumbar stenosis     ORIF HUMERUS FRACTURE Right 09/02/2016   Procedure: OPEN REDUCTION INTERNAL FIXATION (ORIF) RIGHT  HUMERAL SHAFT FRACTURE;  Surgeon: Ozell Bruch, MD;  Location: [facility] OR;  Service: Orthopedics;  Laterality: Right;   WISDOM TOOTH EXTRACTION      Family History  Problem Relation Age of Onset   Sjogren's syndrome Mother    CAD Father     Diabetes Father    Heart disease Father    Mental illness Neg Hx      Social Connections: Somewhat Isolated (01/14/2024)   Received from [facility]   Social Network    How would you rate your social network (family, work, friends)?: Restricted participation with some degree of social isolation     Current Outpatient Medications  Medication Instructions   apixaban  (ELIQUIS ) 5 mg, Oral, 2 times daily   aspirin  EC 81 mg, Oral, Daily, Swallow whole.   atorvastatin  (LIPITOR) 40 mg, Oral, Daily   Blood Glucose Monitoring Suppl (CONTOUR NEXT EZ) w/Device KIT 1 each, Does not apply, 3 times daily   busPIRone  (BUSPAR ) 20 mg, Oral, 2 times daily, Take daily at 8 AM and 3 PM ( DOSE INCREASE)   Continuous Glucose Sensor (FREESTYLE LIBRE 3 PLUS SENSOR) MISC    dicyclomine (BENTYL) 10 mg, Oral, 3 times daily PRN   diphenoxylate-atropine (LOMOTIL) 2.5-0.025 MG tablet 1 tablet, Oral, 4 times daily PRN   glucose blood (CONTOUR NEXT TEST) test strip Use as instructed   HYDROcodone -acetaminophen  (NORCO) 10-325 MG tablet 1 tablet, Oral, 4 times daily PRN   insulin  glargine (LANTUS  SOLOSTAR) 38 Units, Subcutaneous, Daily at 10 pm   insulin  lispro (HUMALOG ) 10-18 Units, Subcutaneous, 3 times daily with meals, Take a base dose of  10 units three times daily with meals. Add additional units of insulin  per sliding scale as needed.   Insulin  Pen Needle (BD PEN NEEDLE NANO ULTRAFINE) 32G X 4 MM MISC Use to inject insulin  4 times daily   Insulin  Pen Needle 32G X 4 MM MISC Use with insulin  pen as directed   ipratropium (ATROVENT ) 0.03 % nasal spray 2 sprays, Each Nare, Every 12 hours   lisinopril  (ZESTRIL ) 20 mg, Oral, Daily   LORazepam  (ATIVAN ) 1 mg, Oral, Daily PRN   magnesium  oxide (MAG-OX) 400 mg, Oral   methocarbamol  (ROBAXIN ) 1,000 mg, Oral, 3 times daily   MICROLET LANCETS MISC 1 each, Does not apply, 3 times daily   mupirocin  cream (BACTROBAN ) 2 % 1 Application, Topical, 2 times daily   naloxone  (NARCAN) 1 mg, Nasal, As needed, For suspected opioid overdose, call 911 and spray 1 mL (half of a syringe) into EACH nostril. If no or minimal response after 3 minutes, repeat sprays.   nortriptyline  (PAMELOR ) 50 mg, Oral, Daily at bedtime   Ozempic (1 MG/DOSE) 1 mg, Weekly   pregabalin  (LYRICA ) 200 mg, Oral, 2 times daily   traZODone (DESYREL) 50 mg, Oral, At bedtime PRN   Vitamin D  (Ergocalciferol ) (DRISDOL) 50,000 Units, Oral, Weekly, Monday     Physical Exam:   None - phone note  Seprately Identifiable Procedures:  Prior to initiating any procedures, risks/benefits/alternatives were explained to the patient and verbal consent obtained. None  Impression & Plans:  [name] is a 58 y.o. male with   1. Nasal obstruction   2. Nasal septal deviation   3. Hypertrophy of both inferior nasal turbinates      Scheduled for septoplasty and for turbinate reduction.  Will reach out to his cardiologist for surgical clearance and Eliquis  plan.  Discussed with patient that this may delay his surgery date but he is okay with this if needed.   See below regarding exact medications prescribed this encounter including dosages and route: No orders of the defined types were placed in this encounter.    Thank you for allowing me the opportunity to care for your patient. Please do not hesitate to contact me should you have any other questions.  Sincerely, Hadassah Parody, MD Otolaryngologist (ENT), [facility] Phone: [phone] Fax: [phone]   "

## 2024-08-07 ENCOUNTER — Encounter (INDEPENDENT_AMBULATORY_CARE_PROVIDER_SITE_OTHER): Payer: Self-pay

## 2024-08-07 NOTE — Progress Notes (Signed)
 Anesthesia Chart Review: SAME DAY WORK-UP  Case: [id number] Date/Time: 08/13/24 1300   Procedure: SEPTOPLASTY, NOSE, WITH NASAL TURBINATE REDUCTION (Bilateral)   Anesthesia type: General   Diagnosis:      Deviated nasal septum [J34.2]     Hypertrophy of nasal turbinates [J34.3]   Pre-op diagnosis:      Deviated nasal septum     Hypertrophy of nasal turbinates   [facility] ROOM 18 / [facility]   Surgeons: Greggory Hadassah BROCKS, MD       DISCUSSION: Patient is a 58 year old male scheduled for the above procedure.  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  Evaluated at [facility] cardiology initially in early 2024 for palpitations with PAT and possible PAF on monitor as part of CVA work-up. He also reported episodes of lightheadedness. At some point he was found to have orthostatic hypotension and came off all antihypertensive medications except lisinopril . Most recent work-up has included a 07/2023 event monitor that showed SR, nonsustained AT, rare PACs/PVCs. There was mention of doing a Tilt Table Test, but I don't see any reports. CTA head/neck in 05/2024 showing no significant carotid artery stenosis, high-grade stenosis versus segmental occlusion of the nondominant left vertebral artery. Nuclear stress test 06/2024 showed ischemia in the PDA territory with inferior wall hypokinesis, and also a RUL lung opacity with short term follow-up advised. 07/2024 TTE showed LVEF 60-65%, trace MR/TR. Due to abnormal stress test he underwent a cardiac cath on 07/29/2024 that showed near total/CTO of a small caliber RPDA, CTA RPLA branch, CTO distal AV groove CX, 70% proximal DIAG, scattered mild to moderate occlusive disease. Severe branch  disease not amendable to PCI and medical therapy advised.  Repeat chest CT (at [facility]) to evaluate RUL opacity was done on 07/23/2024 and showed scattered tree-in-bud opacities in the right lung compatible with chronic airway spread of infection, resolution of RUL cavitary lesion that was thought to represent a more confluent area of acute infection at the time of previous study.  His thoracic aorta was dilated 4.1 cm.    A1c 7.9% on 05/20/2024, previously 8% on 12/18/2023. HE has a Libre 3 Plus sensor. On Lantus , Humalog  SSI with meals, Ozempic.   Per 08/05/2024 note by Dr. Greggory, surgery was previously cancelled until he completed additional cardiac work-up. He has since had the [facility] as mentioned, but she noted they had not received further input or instructions regarding Eliquis . She is reaching back out to cardiology. Chart will be left for follow-up regarding any updated input and medication holds (ie Eliquis , Ozempic).   VS:  Wt Readings from Last 3 Encounters:  05/29/24 136.1 kg  04/26/24 (!) 146.1 kg  02/28/24 (!) 159.2 kg   BP Readings from Last 3 Encounters:  05/29/24 106/69  04/26/24 120/80  03/02/24 135/77   Pulse Readings from Last 3 Encounters:  05/29/24 (!) 106  04/26/24 96  03/02/24 89    PROVIDERS: Herlinda Burnard Garre, FNP is PCP  Tommas Cough, MD is cardiologist [place]) Merilee Anes, MD is EP cardiologist [facility]) - He was being followed at the [facility] for bilateral ankle diabetic foot wounds that has since healed. As needed follow-up per 07/31/2024 visit.   LABS: Lab results as of 07/29/2024 in Care Everywhere include  CBC and BMP: WBC 11.1, hemoglobin 12.5, hematocrit 38.8, platelet count 227,.  Sodium 139, potassium 4.2, glucose 126, BUN 15, creatinine 0.8o, eGFR 103. A1c 7.9% on 05/20/2024.   Split Sleep Study 05/14/2023 ([facility]): IMPRESSIONS  - Limited sleep study due to significant respiratory events with total  sleep time of 197 minutes  - Severe obstructive sleep apnea occurred during this study  AHI = 61.2-h .  - Hypoxemia with approximately 31 minutes spent with O2 saturations < 88%; hypoxic events were directly related to sleep disordered breathing  - Poor sleep efficiency at 39% and significantly fragmented sleep; decreased REM and stage N3 sleep; supine REM was not observed.  - Soft snoring volume.  - Clinically significant periodic limb movements did not occur during sleep.  PLMI = 0.0-h  No significant associated arousals.  RECOMMENDATIONS  - Treatment for severe obstructive sleep apnea is often warranted even in the absence of clinical symptoms. Regardless of treatment approach for the obstructive sleep apnea, maximization of nasal airway patency, weight loss if appropriate, and avoidance  of sedatives and alcohol in proximity to bedtime are strongly encouraged...   IMAGES: CT Chest 07/23/2024 ([facility]): IMPRESSION: 1.  Scattered tree-in-bud opacities in the right lung, compatible with chronic airways spread of infection.  2.  Dilatation of the aorta measuring up to 4.1 cm.  3.  Previously reported cavitary lesion is not present on today's study. This was likely a more confluent area of acute infection at the time of study.   CTA Head/Neck 05/24/2024 ([facility]): IMPRESSION: 1.  No acute intracranial hemorrhage.  2.  No findings specific for acute territorial infarction. Small remote left cerebellar ischemic infarction. If there is clinical suspicion for acute infarction, then brain MRI would be recommended for further evaluation.  3.  High-grade stenosis versus segmental occlusion of the nondominant left vertebral artery, as further described above.  - Left carotid system: No occlusion or significant stenosis (50% or greater).  - Right carotid system: No occlusion or significant stenosis (50% or greater).    EKG: EKG 07/29/2024 ([facility]): Per Narrative in [facility]: Sinus rhythm with  occasional premature ventricular complexes  Abnormal QRS-T angle, consider primary T wave abnormality  Abnormal ECG  When compared with ECG of 24-Dec-2022 07:57,  premature ventricular complexes are now present  premature atrial complexes are no longer present  Nonspecific T wave abnormality no longer evident in Inferior leads  Nonspecific T wave abnormality, improved in Lateral leads  Hiram Haber ([id number]) on 07/29/2024 10:11:39 AM certifies that he/she has reviewed the ECG tracing and confirms the independent interpretation is correct.    CV: Cardiac cath 07/29/2024 ([facility]): Summary:  Severe branch vessel disease not amenable to PCI.   Near total and chronic occlusion of a small caliber right posterior  descending artery; chronic occlusion of right posterolateral branch.  Chronic occlusion of distal AV groove circumflex.  70% proximal diagonal branch stenosis.  Scattered mild to moderate nonocclusive disease.   Plan:  The patient's stress test findings of inferolateral ischemia are explained  by both his chronic posterior lateral branch occlusion and distal  circumflex occlusion.  Neither these are amenable to PCI due to chronicity  and small vessel caliber.  Recommend aggressive medical therapy.    Echo 07/17/2024 ([facility]): IMPRESSION: Technically difficult study    Left Ventricle: Systolic function is normal. EF: 60-65%.    Right Ventricle: Systolic function is normal.  - Trace mitral regurgitation.  Trace tricuspid regurgitation.   Nuclear stress  test 06/19/2024 ([facility]): 1.) LIMITATIONS: None.  2.) MYOCARDIAL PERFUSION: The exam is limited given inferior cut uptake, however, is concerning for a medium size sized region of ischemia in the PDA territory. Rubidium PET may be of benefit to assess for coronary flow reserve.  3.) LEFT VENTRICULAR EJECTION FRACTION: Normal.  4.) REGIONAL WALL MOTION: Inferior wall hypokinesis.  5.) Predominately right-sided opacities with  a suggestion of a cavitary right upper lobe opacity for which correlation with patient symptoms is suggested and short-term follow-up CT chest is recommended to document resolution.    30 Day Cardiac event monitor 07/20/2023 ([facility]): Ambulatory Monitor Report:  Sinus rhythm  Rare PACs and PVCs  Nonsustained atrial tachycardia  Multiple symptom markers correspond to sinus rhythm and mild sinus tachycardia    Past Medical History:  Diagnosis Date   Anxiety    Carotid stenosis, right    Diabetes mellitus without complication (HCC)    Fracture closed, humerus    right   Fracture of humeral shaft, right, closed 09/02/2016   Fracture of humerus, proximal, right, closed 09/03/2016   Hyperlipidemia    Hypertension    Neuropathy    Paroxysmal atrial fibrillation (HCC)    Pre-syncope    Stroke [facility])    Vasomotor rhinitis     Past Surgical History:  Procedure Laterality Date   AMPUTATION TOE Right 06/16/2017   Procedure: AMPUTATION TOE right foot 2nd toe;  Surgeon: Harden Jerona GAILS, MD;  Location: [facility] OR;  Service: Orthopedics;  Laterality: Right;   CATARACT EXTRACTION Left    CHOLECYSTECTOMY     cyst removal knee     CYST REMOVAL NECK     Lumbar radiculopathy     Lumbar stenosis     ORIF HUMERUS FRACTURE Right 09/02/2016   Procedure: OPEN REDUCTION INTERNAL FIXATION (ORIF) RIGHT  HUMERAL SHAFT FRACTURE;  Surgeon: Ozell Bruch, MD;  Location: [facility] OR;  Service: Orthopedics;  Laterality: Right;   WISDOM TOOTH EXTRACTION      MEDICATIONS:  apixaban  (ELIQUIS ) 5 MG TABS tablet   aspirin  EC 81 MG tablet   atorvastatin  (LIPITOR) 80 MG tablet   busPIRone  (BUSPAR ) 10 MG tablet   dicyclomine (BENTYL) 10 MG capsule   diphenoxylate-atropine (LOMOTIL) 2.5-0.025 MG tablet   HYDROcodone -acetaminophen  (NORCO) 10-325 MG tablet   Insulin  Glargine (LANTUS  SOLOSTAR) 100 UNIT/ML Solostar Pen   insulin  lispro (HUMALOG ) 100 UNIT/ML KwikPen   ipratropium (ATROVENT ) 0.03 % nasal spray    lisinopril  (ZESTRIL ) 20 MG tablet   LORazepam  (ATIVAN ) 1 MG tablet   magnesium  oxide (MAG-OX) 400 MG tablet   methocarbamol  (ROBAXIN ) 500 MG tablet   mupirocin  cream (BACTROBAN ) 2 %   naloxone (NARCAN) 2 MG/2ML injection   nortriptyline  (PAMELOR ) 50 MG capsule   OZEMPIC, 1 MG/DOSE, 4 MG/3ML SOPN   pregabalin  (LYRICA ) 200 MG capsule   traZODone (DESYREL) 50 MG tablet   Vitamin D , Ergocalciferol , (DRISDOL) 1.25 MG (50000 UNIT) CAPS capsule   Blood Glucose Monitoring Suppl (CONTOUR NEXT EZ) w/Device KIT   Continuous Glucose Sensor (FREESTYLE LIBRE 3 PLUS SENSOR) MISC   glucose blood (CONTOUR NEXT TEST) test strip   Insulin  Pen Needle (BD PEN NEEDLE NANO ULTRAFINE) 32G X 4 MM MISC   Insulin  Pen Needle 32G X 4 MM MISC   MICROLET LANCETS MISC    Isaiah Ruder, PA-C Surgical Short Stay/Anesthesiology [facility] Phone [phone] [facility] Phone [phone] 08/07/2024 3:58 PM

## 2024-08-09 ENCOUNTER — Telehealth (INDEPENDENT_AMBULATORY_CARE_PROVIDER_SITE_OTHER): Payer: Self-pay

## 2024-08-09 NOTE — Telephone Encounter (Signed)
 I spoke with patient when he called into office today at 12:09pm. He is wanting to know if we have gotten medical clearance for him, and wants to reschedule surgery. I informed him that I am sending Dr. Judythe MA a message on this and she will reach back out to him.

## 2024-08-09 NOTE — Telephone Encounter (Signed)
 Called and spoke with [facility] and they stated that they are not comfortable with the patient getting the surgery at this time. They just changed patient's medication on Friday and want to see how he responds to the new medication since his blood pressure has been high.  I then called patient and explained to him that we had to reschedule his surgery and what his Cardiologist had told me. Patient understood and stated he was ready when we can get it figured out. I did let him know cardio would be calling him within a couple hours to try to get him seen sooner.

## 2024-08-13 ENCOUNTER — Encounter (HOSPITAL_COMMUNITY): Payer: Self-pay | Admitting: Vascular Surgery

## 2024-08-13 ENCOUNTER — Encounter (HOSPITAL_COMMUNITY): Admission: RE | Payer: Self-pay | Source: Home / Self Care

## 2024-08-13 ENCOUNTER — Ambulatory Visit (HOSPITAL_COMMUNITY): Admission: RE | Admit: 2024-08-13 | Source: Home / Self Care

## 2024-08-13 SURGERY — SEPTOPLASTY, NOSE, WITH NASAL TURBINATE REDUCTION
Anesthesia: General | Laterality: Bilateral

## 2024-08-19 ENCOUNTER — Encounter (INDEPENDENT_AMBULATORY_CARE_PROVIDER_SITE_OTHER)

## 2024-09-04 ENCOUNTER — Ambulatory Visit: Attending: Neurology

## 2024-09-04 DIAGNOSIS — M6281 Muscle weakness (generalized): Secondary | ICD-10-CM | POA: Diagnosis present

## 2024-09-04 DIAGNOSIS — R278 Other lack of coordination: Secondary | ICD-10-CM | POA: Diagnosis present

## 2024-09-04 DIAGNOSIS — G5601 Carpal tunnel syndrome, right upper limb: Secondary | ICD-10-CM | POA: Insufficient documentation

## 2024-09-06 ENCOUNTER — Encounter (INDEPENDENT_AMBULATORY_CARE_PROVIDER_SITE_OTHER): Payer: Self-pay

## 2024-09-11 ENCOUNTER — Ambulatory Visit

## 2024-09-16 ENCOUNTER — Ambulatory Visit: Admitting: Occupational Therapy

## 2024-09-17 ENCOUNTER — Ambulatory Visit: Admitting: Psychiatry

## 2024-09-18 ENCOUNTER — Ambulatory Visit

## 2024-09-19 ENCOUNTER — Ambulatory Visit

## 2024-09-23 ENCOUNTER — Ambulatory Visit

## 2024-09-25 ENCOUNTER — Ambulatory Visit

## 2024-09-30 ENCOUNTER — Ambulatory Visit: Admitting: Occupational Therapy

## 2024-10-02 ENCOUNTER — Ambulatory Visit: Admitting: Occupational Therapy

## 2024-10-07 ENCOUNTER — Ambulatory Visit: Admitting: Occupational Therapy

## 2024-10-09 ENCOUNTER — Ambulatory Visit: Admitting: Occupational Therapy

## 2024-10-14 ENCOUNTER — Ambulatory Visit: Admitting: Occupational Therapy

## 2024-10-16 ENCOUNTER — Ambulatory Visit

## 2024-10-21 ENCOUNTER — Ambulatory Visit: Admitting: Occupational Therapy

## 2024-10-23 ENCOUNTER — Ambulatory Visit: Admitting: Occupational Therapy

## 2024-10-28 ENCOUNTER — Ambulatory Visit: Admitting: Occupational Therapy

## 2024-10-30 ENCOUNTER — Ambulatory Visit: Admitting: Occupational Therapy

## 2024-11-04 ENCOUNTER — Ambulatory Visit: Admitting: Occupational Therapy

## 2024-11-06 ENCOUNTER — Ambulatory Visit: Admitting: Occupational Therapy

## 2024-11-11 ENCOUNTER — Ambulatory Visit

## 2024-11-13 ENCOUNTER — Ambulatory Visit
